# Patient Record
Sex: Female | Born: 1935 | Race: White | Hispanic: No | Smoking: Former smoker
Health system: Southern US, Community
[De-identification: ages and names within clinical notes are randomized; demographics above are authoritative.]

## PROBLEM LIST (undated history)

## (undated) ENCOUNTER — Emergency Department (HOSPITAL_BASED_OUTPATIENT_CLINIC_OR_DEPARTMENT_OTHER): Discharge status: Absent without leave | Payer: Medicare Other

## (undated) DIAGNOSIS — I10 Essential (primary) hypertension: Secondary | ICD-10-CM

## (undated) DIAGNOSIS — N289 Disorder of kidney and ureter, unspecified: Secondary | ICD-10-CM

## (undated) DIAGNOSIS — J449 Chronic obstructive pulmonary disease, unspecified: Secondary | ICD-10-CM

## (undated) DIAGNOSIS — W3400XA Accidental discharge from unspecified firearms or gun, initial encounter: Secondary | ICD-10-CM

## (undated) DIAGNOSIS — I2510 Atherosclerotic heart disease of native coronary artery without angina pectoris: Secondary | ICD-10-CM

## (undated) DIAGNOSIS — J189 Pneumonia, unspecified organism: Secondary | ICD-10-CM

## (undated) DIAGNOSIS — E785 Hyperlipidemia, unspecified: Secondary | ICD-10-CM

## (undated) DIAGNOSIS — K56609 Unspecified intestinal obstruction, unspecified as to partial versus complete obstruction: Secondary | ICD-10-CM

## (undated) DIAGNOSIS — I714 Abdominal aortic aneurysm, without rupture, unspecified: Secondary | ICD-10-CM

## (undated) DIAGNOSIS — C801 Malignant (primary) neoplasm, unspecified: Secondary | ICD-10-CM

## (undated) HISTORY — PX: APPENDECTOMY: SHX54

## (undated) HISTORY — PX: TONSILLECTOMY: SUR1361

## (undated) HISTORY — DX: Atherosclerotic heart disease of native coronary artery without angina pectoris: I25.10

## (undated) HISTORY — PX: OTHER SURGICAL HISTORY: SHX169

## (undated) HISTORY — PX: CHOLECYSTECTOMY: SHX55

## (undated) HISTORY — DX: Abdominal aortic aneurysm, without rupture, unspecified: I71.40

## (undated) HISTORY — DX: Chronic obstructive pulmonary disease, unspecified: J44.9

## (undated) HISTORY — PX: NEPHRECTOMY: SHX65

## (undated) HISTORY — PX: RETINAL DETACHMENT SURGERY: SHX105

## (undated) HISTORY — DX: Essential (primary) hypertension: I10

## (undated) HISTORY — DX: Malignant (primary) neoplasm, unspecified: C80.1

## (undated) HISTORY — DX: Abdominal aortic aneurysm, without rupture: I71.4

## (undated) HISTORY — DX: Pneumonia, unspecified organism: J18.9

---

## 2013-07-16 DIAGNOSIS — IMO0002 Reserved for concepts with insufficient information to code with codable children: Secondary | ICD-10-CM | POA: Insufficient documentation

## 2013-07-16 DIAGNOSIS — N289 Disorder of kidney and ureter, unspecified: Secondary | ICD-10-CM | POA: Insufficient documentation

## 2013-07-29 ENCOUNTER — Ambulatory Visit: Payer: Medicare Other | Attending: Family Medicine | Admitting: Physical Therapy

## 2013-07-29 DIAGNOSIS — M545 Low back pain, unspecified: Secondary | ICD-10-CM | POA: Insufficient documentation

## 2013-07-29 DIAGNOSIS — M25569 Pain in unspecified knee: Secondary | ICD-10-CM | POA: Insufficient documentation

## 2013-07-29 DIAGNOSIS — IMO0001 Reserved for inherently not codable concepts without codable children: Secondary | ICD-10-CM | POA: Insufficient documentation

## 2013-07-29 DIAGNOSIS — M25559 Pain in unspecified hip: Secondary | ICD-10-CM | POA: Insufficient documentation

## 2013-08-01 ENCOUNTER — Ambulatory Visit: Payer: Medicare Other | Admitting: Physical Therapy

## 2013-08-06 ENCOUNTER — Ambulatory Visit: Payer: Medicare Other | Attending: Family Medicine | Admitting: Physical Therapy

## 2013-08-06 DIAGNOSIS — IMO0001 Reserved for inherently not codable concepts without codable children: Secondary | ICD-10-CM | POA: Insufficient documentation

## 2013-08-06 DIAGNOSIS — M545 Low back pain, unspecified: Secondary | ICD-10-CM | POA: Insufficient documentation

## 2013-08-06 DIAGNOSIS — M25569 Pain in unspecified knee: Secondary | ICD-10-CM | POA: Insufficient documentation

## 2013-08-06 DIAGNOSIS — M25559 Pain in unspecified hip: Secondary | ICD-10-CM | POA: Insufficient documentation

## 2013-08-08 ENCOUNTER — Ambulatory Visit: Payer: Medicare Other | Admitting: Physical Therapy

## 2013-08-09 DIAGNOSIS — M1990 Unspecified osteoarthritis, unspecified site: Secondary | ICD-10-CM | POA: Insufficient documentation

## 2013-08-13 ENCOUNTER — Ambulatory Visit: Payer: Medicare Other | Admitting: Physical Therapy

## 2013-08-15 ENCOUNTER — Encounter: Payer: Medicare Other | Admitting: Physical Therapy

## 2013-08-16 ENCOUNTER — Ambulatory Visit: Payer: Medicare Other | Admitting: Physical Therapy

## 2013-08-20 ENCOUNTER — Ambulatory Visit: Payer: Medicare Other | Admitting: Physical Therapy

## 2013-08-22 ENCOUNTER — Ambulatory Visit: Payer: Medicare Other | Admitting: Physical Therapy

## 2013-08-23 ENCOUNTER — Ambulatory Visit: Payer: Medicare Other | Admitting: Physical Therapy

## 2013-08-27 ENCOUNTER — Ambulatory Visit: Payer: Medicare Other | Admitting: Physical Therapy

## 2013-08-29 ENCOUNTER — Ambulatory Visit: Payer: Medicare Other | Admitting: Physical Therapy

## 2013-08-30 ENCOUNTER — Ambulatory Visit: Payer: Medicare Other | Admitting: Physical Therapy

## 2013-09-03 ENCOUNTER — Ambulatory Visit: Payer: Medicare Other | Admitting: Physical Therapy

## 2013-09-05 ENCOUNTER — Ambulatory Visit: Payer: Medicare Other | Admitting: Physical Therapy

## 2013-09-06 ENCOUNTER — Ambulatory Visit: Payer: Medicare Other | Attending: Family Medicine | Admitting: Physical Therapy

## 2013-09-06 DIAGNOSIS — M545 Low back pain, unspecified: Secondary | ICD-10-CM | POA: Insufficient documentation

## 2013-09-06 DIAGNOSIS — IMO0001 Reserved for inherently not codable concepts without codable children: Secondary | ICD-10-CM | POA: Insufficient documentation

## 2013-09-06 DIAGNOSIS — M25559 Pain in unspecified hip: Secondary | ICD-10-CM | POA: Insufficient documentation

## 2013-09-06 DIAGNOSIS — M25569 Pain in unspecified knee: Secondary | ICD-10-CM | POA: Insufficient documentation

## 2013-09-12 ENCOUNTER — Ambulatory Visit (INDEPENDENT_AMBULATORY_CARE_PROVIDER_SITE_OTHER): Payer: Medicare Other | Admitting: Internal Medicine

## 2013-09-12 ENCOUNTER — Encounter: Payer: Self-pay | Admitting: Internal Medicine

## 2013-09-12 ENCOUNTER — Ambulatory Visit (INDEPENDENT_AMBULATORY_CARE_PROVIDER_SITE_OTHER)
Admission: RE | Admit: 2013-09-12 | Discharge: 2013-09-12 | Disposition: A | Discharge status: Home / Self Care | Payer: Medicare Other | Source: Ambulatory Visit | Attending: Internal Medicine | Admitting: Internal Medicine

## 2013-09-12 VITALS — BP 134/62 | HR 42 | Temp 97.8°F | Ht 66.5 in | Wt 155.0 lb

## 2013-09-12 DIAGNOSIS — J449 Chronic obstructive pulmonary disease, unspecified: Secondary | ICD-10-CM

## 2013-09-12 DIAGNOSIS — R0602 Shortness of breath: Secondary | ICD-10-CM

## 2013-09-12 NOTE — Progress Notes (Signed)
Quick Note:  Spoke with pt and notified of results per Dr. Wert. Pt verbalized understanding and denied any questions.  ______ 

## 2013-09-12 NOTE — Progress Notes (Signed)
  Subjective:    Patient ID: Gina Bond, female    DOB: [birth year]  MRN: [id number]  HPI  16 [place] and [place] with no resp complaints but noisy breathing seemed better quit but referred 09/12/2013 to pulmonary clinic by Dr Virl Son for copd eval   09/12/2013 [facility] office visit/ Gina Bond cc "I had no problem until they put me on inhalers" stopped everything x 3 weeks prior to OV (albuterol with spacer)  Says can walk dog daily x 15 min with labored breathing only on inclines  , occ at [facility] with rattling but not really prod coughing or tendency to acute exac  Very confused with details of medical care  No obvious day to day or daytime variabilty or assoc  cp or chest tightness, subjective wheeze overt sinus or hb symptoms. No unusual exp hx or h/o childhood pna/ asthma or knowledge of premature birth.  Sleeping ok without nocturnal  or early am exacerbation  of respiratory  c/o's or need for noct saba. Also denies any obvious fluctuation of symptoms with weather or environmental changes or other aggravating or alleviating factors except as outlined above   Current Medications, Allergies, Complete Past Medical History, Past Surgical History, Family History, and Social History were reviewed in [employer] record.          Review of Systems  Constitutional: Negative for fever, chills and unexpected weight change.  HENT: Positive for congestion. Negative for dental problem, ear pain, nosebleeds, postnasal drip, rhinorrhea, sinus pressure, sneezing, sore throat, trouble swallowing and voice change.   Eyes: Negative for visual disturbance.  Respiratory: Positive for cough and shortness of breath. Negative for choking.   Cardiovascular: Negative for chest pain and leg swelling.  Gastrointestinal: Positive for abdominal pain. Negative for vomiting and diarrhea.  Genitourinary: Negative for difficulty urinating.  Musculoskeletal:  Positive for arthralgias.  Skin: Negative for rash.  Neurological: Negative for tremors, syncope and headaches.  Hematological: Does not bruise/bleed easily.       Objective:   Physical Exam  amb wf nad but somewhat congested cough  Wt Readings from Last 3 Encounters:  09/12/13 155 lb (70.308 kg)     HEENT mild turbinate edema. Full dentures  Oropharynx no thrush or excess pnd or cobblestoning.  No JVD or cervical adenopathy. Mild accessory muscle hypertrophy. Trachea midline, nl thryroid. Chest was hyperinflated by percussion with diminished breath sounds and moderate increased exp time without wheeze. Hoover sign positive at mid inspiration. Regular rate and rhythm without murmur gallop or rub or increase P2 or edema.  Abd: no hsm, nl excursion. Ext warm without cyanosis or clubbing.      CXR  09/12/2013 :  COPD without acute abnormality.       Assessment & Plan:

## 2013-09-12 NOTE — Patient Instructions (Addendum)
GERD (REFLUX)  is an extremely common cause of respiratory symptoms, many times with no significant heartburn at all.    It can be treated with medication, but also with lifestyle changes including avoidance of late meals, excessive alcohol, smoking cessation, and avoid fatty foods, chocolate, peppermint, colas, red wine, and acidic juices such as orange juice.  NO MINT OR MENTHOL PRODUCTS SO NO COUGH DROPS  USE SUGARLESS CANDY INSTEAD (jolley ranchers or Stover's)  NO OIL BASED VITAMINS - use powdered substitutes   We need to find substitutes for your beta blockers substitutes if feasible - Betoptic for eyedrop/ bisoprolol for blood pressure are the exceptions  Only use your albuterol as a rescue medication to be used if you can't catch your breath by resting or doing a relaxed purse lip breathing pattern.  - The less you use it, the better it will work when you need it. - Ok to use up to every 4 hours if you must but call for immediate appointment if use goes up over your usual need - Don't leave home without it !!  (think of it like your spare tire for your car)   Please remember to go to the x-ray department downstairs for your tests - we will call you with the results when they are available.     Please schedule a follow up office visit in 4-6 weeks, sooner if needed with all medications in hand and  with pfts on return

## 2013-09-13 DIAGNOSIS — J449 Chronic obstructive pulmonary disease, unspecified: Secondary | ICD-10-CM | POA: Insufficient documentation

## 2013-09-13 NOTE — Assessment & Plan Note (Signed)
She has more copd by exam than her hx suggests and the commend that she wasn't sob until placed on inhalers is very unusual and shows very poor insight into her problem eg I asked why she would be put on inhalers if she wasn't short of breath in the first place and that really confused her.  DDX of  difficult airways managment all start with A and  include Adherence, Ace Inhibitors, Acid Reflux, Active Sinus Disease, Alpha 1 Antitripsin deficiency, Anxiety masquerading as Airways dz,  ABPA,  allergy(esp in young), Aspiration (esp in elderly), Adverse effects of DPI,  Active smokers, plus two Bs  = Bronchiectasis and Beta blocker use..and one C= CHF  Adherence is always the initial "prime suspect" and is a multilayered concern that requires a "trust but verify" approach in every patient - starting with knowing how to use medications, especially inhalers, correctly, keeping up with refills and understanding the fundamental difference between maintenance and prns vs those medications only taken for a very short course and then stopped and not refilled. This will be the biggest challenge given the poor insight she demonstrated today.  Beta blockers a concern > not only  On timolol eyedrops but also another systemic BB she did not know the name of.  i would prefer she be off these prior to return for pft's before and after B2 for adequate GOLD staging.

## 2013-09-17 ENCOUNTER — Other Ambulatory Visit: Payer: Self-pay | Admitting: Nephrology

## 2013-09-17 DIAGNOSIS — R748 Abnormal levels of other serum enzymes: Secondary | ICD-10-CM

## 2013-09-17 DIAGNOSIS — N179 Acute kidney failure, unspecified: Secondary | ICD-10-CM

## 2013-09-19 ENCOUNTER — Ambulatory Visit
Admission: RE | Admit: 2013-09-19 | Discharge: 2013-09-19 | Disposition: A | Discharge status: Home / Self Care | Payer: Medicare Other | Source: Ambulatory Visit | Attending: Nephrology | Admitting: Nephrology

## 2013-09-19 DIAGNOSIS — N179 Acute kidney failure, unspecified: Secondary | ICD-10-CM

## 2013-09-20 ENCOUNTER — Ambulatory Visit (HOSPITAL_COMMUNITY): Payer: Medicare Other | Attending: Nephrology

## 2013-09-20 DIAGNOSIS — N189 Chronic kidney disease, unspecified: Secondary | ICD-10-CM | POA: Insufficient documentation

## 2013-09-20 DIAGNOSIS — J449 Chronic obstructive pulmonary disease, unspecified: Secondary | ICD-10-CM | POA: Insufficient documentation

## 2013-09-20 DIAGNOSIS — N179 Acute kidney failure, unspecified: Secondary | ICD-10-CM

## 2013-09-20 DIAGNOSIS — N183 Chronic kidney disease, stage 3 unspecified: Secondary | ICD-10-CM

## 2013-09-20 DIAGNOSIS — I129 Hypertensive chronic kidney disease with stage 1 through stage 4 chronic kidney disease, or unspecified chronic kidney disease: Secondary | ICD-10-CM | POA: Insufficient documentation

## 2013-09-20 DIAGNOSIS — Q649 Congenital malformation of urinary system, unspecified: Secondary | ICD-10-CM | POA: Insufficient documentation

## 2013-09-20 DIAGNOSIS — I10 Essential (primary) hypertension: Secondary | ICD-10-CM

## 2013-09-20 DIAGNOSIS — E785 Hyperlipidemia, unspecified: Secondary | ICD-10-CM | POA: Insufficient documentation

## 2013-09-20 DIAGNOSIS — J4489 Other specified chronic obstructive pulmonary disease: Secondary | ICD-10-CM | POA: Insufficient documentation

## 2013-09-24 ENCOUNTER — Ambulatory Visit: Payer: Medicare Other | Admitting: Physical Therapy

## 2013-10-09 ENCOUNTER — Other Ambulatory Visit: Payer: Self-pay | Admitting: Internal Medicine

## 2013-10-09 DIAGNOSIS — J449 Chronic obstructive pulmonary disease, unspecified: Secondary | ICD-10-CM

## 2013-10-10 ENCOUNTER — Ambulatory Visit (HOSPITAL_COMMUNITY)
Admission: RE | Admit: 2013-10-10 | Discharge: 2013-10-10 | Disposition: A | Discharge status: Home / Self Care | Payer: Medicare Other | Source: Ambulatory Visit | Attending: Internal Medicine | Admitting: Internal Medicine

## 2013-10-10 ENCOUNTER — Ambulatory Visit (INDEPENDENT_AMBULATORY_CARE_PROVIDER_SITE_OTHER): Payer: Medicare Other | Admitting: Internal Medicine

## 2013-10-10 ENCOUNTER — Encounter: Payer: Self-pay | Admitting: Internal Medicine

## 2013-10-10 VITALS — BP 142/70 | HR 66 | Temp 97.6°F | Ht 66.0 in | Wt 150.0 lb

## 2013-10-10 DIAGNOSIS — J438 Other emphysema: Secondary | ICD-10-CM | POA: Insufficient documentation

## 2013-10-10 DIAGNOSIS — J449 Chronic obstructive pulmonary disease, unspecified: Secondary | ICD-10-CM

## 2013-10-10 DIAGNOSIS — Z87891 Personal history of nicotine dependence: Secondary | ICD-10-CM | POA: Insufficient documentation

## 2013-10-10 LAB — PULMONARY FUNCTION TEST
DL/VA % pred: 54 %
DL/VA: 2.73 ml/min/mmHg/L
DLCO unc % pred: 44 %
DLCO unc: 11.91 ml/min/mmHg
FEF 25-75 Post: 0.78 L/sec
FEF 25-75 Pre: 0.87 L/sec
FEF2575-%Change-Post: -10 %
FEF2575-%Pred-Post: 49 %
FEF2575-%Pred-Pre: 55 %
FEV1-%Change-Post: -2 %
FEV1-%Pred-Post: 68 %
FEV1-%Pred-Pre: 70 %
FEV1-Post: 1.49 L
FEV1-Pre: 1.53 L
FEV1FVC-%Change-Post: -10 %
FEV1FVC-%Pred-Pre: 85 %
FEV6-%Change-Post: 9 %
FEV6-%Pred-Post: 92 %
FEV6-%Pred-Pre: 84 %
FEV6-Post: 2.55 L
FEV6-Pre: 2.33 L
FEV6FVC-%Change-Post: -1 %
FEV6FVC-%Pred-Post: 102 %
FEV6FVC-%Pred-Pre: 104 %
FVC-%Change-Post: 8 %
FVC-%Pred-Post: 90 %
FVC-%Pred-Pre: 83 %
FVC-Post: 2.62 L
FVC-Pre: 2.41 L
Post FEV1/FVC ratio: 57 %
Post FEV6/FVC ratio: 97 %
Pre FEV1/FVC ratio: 64 %
Pre FEV6/FVC Ratio: 99 %
RV % pred: 238 %
RV: 5.9 L
TLC % pred: 157 %
TLC: 8.43 L

## 2013-10-10 MED ORDER — ALBUTEROL SULFATE (5 MG/ML) 0.5% IN NEBU
2.5000 mg | INHALATION_SOLUTION | Freq: Once | RESPIRATORY_TRACT | Status: AC
  Administered 2013-10-10: 2.5 mg via RESPIRATORY_TRACT

## 2013-10-10 NOTE — Progress Notes (Signed)
  Subjective:    Patient ID: Gina Bond, female    DOB: 06-30-1932  MRN: [id number]  Brief patient profile:  80 [place] with no resp complaints but noisy breathing seemed better quit but referred 09/12/2013 to pulmonary clinic by Dr Virl Son for copd eval  History of Present Illness  09/12/2013 1st [facility] office visit/ Gina Bond cc "I had no problem until they put me on inhalers" stopped everything x 3 weeks prior to OV (albuterol with spacer)  Says can walk dog daily x 15 min with labored breathing only on inclines, occ at [facility] with rattling but not really prod coughing or tendency to acute exac Very confused with details of medical care rec GERD diet   We need to find substitutes for your beta blockers substitutes if feasible - Betoptic for eyedrop/ bisoprolol for blood pressure are the exceptions Only use your albuterol    10/10/2013 f/u ov/Gina Bond re: much better off Beta blockers Chief Complaint  Patient presents with  . Follow-up    Pt states that her breathing is much improved since the last visit. No new co's today.     Not limited by breathing at all  No obvious day to day or daytime variabilty or assoc chronic cough or cp or chest tightness, subjective wheeze overt sinus or hb symptoms. No unusual exp hx or h/o childhood pna/ asthma or knowledge of premature birth.  Sleeping ok without nocturnal  or early am exacerbation  of respiratory  c/o's or need for noct saba. Also denies any obvious fluctuation of symptoms with weather or environmental changes or other aggravating or alleviating factors except as outlined above   Current Medications, Allergies, Complete Past Medical History, Past Surgical History, Family History, and Social History were reviewed in [employer] record.  ROS  The following are not active complaints unless bolded sore throat, dysphagia, dental problems, itching, sneezing,  nasal congestion  or excess/ purulent secretions, ear ache,   fever, chills, sweats, unintended wt loss, pleuritic or exertional cp, hemoptysis,  orthopnea pnd or leg swelling, presyncope, palpitations, heartburn, abdominal pain, anorexia, nausea, vomiting, diarrhea  or change in bowel or urinary habits, change in stools or urine, dysuria,hematuria,  rash, arthralgias, visual complaints, headache, numbness weakness or ataxia or problems with walking or coordination,  change in mood/affect or memory.            Objective:   Physical Exam  amb wf nad no longer with congested sounding cough   Wt Readings from Last 3 Encounters:  10/10/13 150 lb (68.04 kg)  09/12/13 155 lb (70.308 kg)          HEENT mild turbinate edema. Full dentures  Oropharynx no thrush or excess pnd or cobblestoning.  No JVD or cervical adenopathy. Mild accessory muscle hypertrophy. Trachea midline, nl thryroid. Chest was min hyperinflated by percussion with slt  diminished breath sounds and mild increased exp time without wheeze. Hoover sign positive at end inspiration. Regular rate and rhythm without murmur gallop or rub or increase P2 or edema.  Abd: no hsm, nl excursion. Ext warm without cyanosis or clubbing.      CXR  09/12/2013 :  COPD without acute abnormality.       Assessment & Plan:

## 2013-10-10 NOTE — Patient Instructions (Signed)
Only use your albuterol (xopenex) as a rescue medication to be used if you can't catch your breath by resting or doing a relaxed purse lip breathing pattern.  - The less you use it, the better it will work when you need it. - Ok to use up to every 4 hours if you must but call for immediate appointment if use goes up over your usual need - Don't leave home without it !!  (think of it like your spare tire for your car)  Pulmonary follow up is as needed

## 2013-10-10 NOTE — Assessment & Plan Note (Addendum)
-   PFT's 10/10/2013   FEV1 1.53 (70%) ratio 64 and no Better p B2,  DLCO 44% and dlco 54%    I had an extended summary discussion with the patient and daughter today lasting 15 to 20 minutes of a 25 minute visit on the following issues:  As I explained to this patient in detail:  although there may be significant copd present, it does not appear to be limiting activity tolerance any more than a set of worn tires limits someone from driving a car  around a parking lot.  A new set of Michelins might look good but would have no perceived impact on the performance of the car and would not be worth the cost.  That is to say:   this pt is relatively Sedentary so I don't recommend aggressive pulmonary rx at this point unless limiting symptoms arise or acute exacerbations become as issue, neither of which are the case now.  I asked the patient to contact this office at any time in the future should either of these problems arise.    Off beta blockers all of her active symptoms have resolved, so no need for anything but saba prn  See instructions for specific recommendations which were reviewed directly with the patient who was given a copy with highlighter outlining the key components. Marland Kitchen

## 2014-03-10 DIAGNOSIS — E782 Mixed hyperlipidemia: Secondary | ICD-10-CM | POA: Insufficient documentation

## 2014-03-10 DIAGNOSIS — E785 Hyperlipidemia, unspecified: Secondary | ICD-10-CM | POA: Insufficient documentation

## 2014-03-10 DIAGNOSIS — M25559 Pain in unspecified hip: Secondary | ICD-10-CM | POA: Insufficient documentation

## 2014-06-12 ENCOUNTER — Encounter (HOSPITAL_BASED_OUTPATIENT_CLINIC_OR_DEPARTMENT_OTHER): Payer: Self-pay | Admitting: Emergency Medicine

## 2014-06-12 ENCOUNTER — Emergency Department (HOSPITAL_BASED_OUTPATIENT_CLINIC_OR_DEPARTMENT_OTHER)
Admission: EM | Admit: 2014-06-12 | Discharge: 2014-06-13 | Disposition: A | Discharge status: Home / Self Care | Payer: Medicare Other | Attending: Emergency Medicine | Admitting: Emergency Medicine

## 2014-06-12 DIAGNOSIS — J4489 Other specified chronic obstructive pulmonary disease: Secondary | ICD-10-CM | POA: Insufficient documentation

## 2014-06-12 DIAGNOSIS — Z8701 Personal history of pneumonia (recurrent): Secondary | ICD-10-CM | POA: Insufficient documentation

## 2014-06-12 DIAGNOSIS — Z23 Encounter for immunization: Secondary | ICD-10-CM | POA: Insufficient documentation

## 2014-06-12 DIAGNOSIS — Z8639 Personal history of other endocrine, nutritional and metabolic disease: Secondary | ICD-10-CM | POA: Insufficient documentation

## 2014-06-12 DIAGNOSIS — M542 Cervicalgia: Secondary | ICD-10-CM

## 2014-06-12 DIAGNOSIS — S0990XA Unspecified injury of head, initial encounter: Secondary | ICD-10-CM | POA: Insufficient documentation

## 2014-06-12 DIAGNOSIS — R296 Repeated falls: Secondary | ICD-10-CM | POA: Insufficient documentation

## 2014-06-12 DIAGNOSIS — Y9289 Other specified places as the place of occurrence of the external cause: Secondary | ICD-10-CM | POA: Insufficient documentation

## 2014-06-12 DIAGNOSIS — J449 Chronic obstructive pulmonary disease, unspecified: Secondary | ICD-10-CM | POA: Insufficient documentation

## 2014-06-12 DIAGNOSIS — I10 Essential (primary) hypertension: Secondary | ICD-10-CM | POA: Insufficient documentation

## 2014-06-12 DIAGNOSIS — S0993XA Unspecified injury of face, initial encounter: Secondary | ICD-10-CM | POA: Insufficient documentation

## 2014-06-12 DIAGNOSIS — Z79899 Other long term (current) drug therapy: Secondary | ICD-10-CM | POA: Insufficient documentation

## 2014-06-12 DIAGNOSIS — Z862 Personal history of diseases of the blood and blood-forming organs and certain disorders involving the immune mechanism: Secondary | ICD-10-CM | POA: Insufficient documentation

## 2014-06-12 DIAGNOSIS — Z87891 Personal history of nicotine dependence: Secondary | ICD-10-CM | POA: Insufficient documentation

## 2014-06-12 DIAGNOSIS — Z7982 Long term (current) use of aspirin: Secondary | ICD-10-CM | POA: Insufficient documentation

## 2014-06-12 DIAGNOSIS — S199XXA Unspecified injury of neck, initial encounter: Secondary | ICD-10-CM

## 2014-06-12 DIAGNOSIS — S2020XA Contusion of thorax, unspecified, initial encounter: Secondary | ICD-10-CM | POA: Insufficient documentation

## 2014-06-12 DIAGNOSIS — W19XXXA Unspecified fall, initial encounter: Secondary | ICD-10-CM

## 2014-06-12 DIAGNOSIS — Z8742 Personal history of other diseases of the female genital tract: Secondary | ICD-10-CM | POA: Insufficient documentation

## 2014-06-12 DIAGNOSIS — Y9389 Activity, other specified: Secondary | ICD-10-CM | POA: Insufficient documentation

## 2014-06-12 HISTORY — DX: Hyperlipidemia, unspecified: E78.5

## 2014-06-12 HISTORY — DX: Disorder of kidney and ureter, unspecified: N28.9

## 2014-06-12 NOTE — ED Notes (Addendum)
Pt tripped and fell into flower pots. She has an abrasion to her right forearm, right lower leg, and a bruise to her upper right arm. Pt c/o pain in her upper, posterior neck. Pt denies LOC.

## 2014-06-12 NOTE — ED Notes (Signed)
C-collar applied

## 2014-06-13 ENCOUNTER — Emergency Department (HOSPITAL_BASED_OUTPATIENT_CLINIC_OR_DEPARTMENT_OTHER): Payer: Medicare Other

## 2014-06-13 MED ORDER — TETANUS-DIPHTH-ACELL PERTUSSIS 5-2.5-18.5 LF-MCG/0.5 IM SUSP
0.5000 mL | Freq: Once | INTRAMUSCULAR | Status: AC
  Administered 2014-06-13: 0.5 mL via INTRAMUSCULAR
  Filled 2014-06-13: qty 0.5

## 2014-06-13 MED ORDER — ACETAMINOPHEN 500 MG PO TABS: 500.0000 mg | ORAL_TABLET | Freq: Four times a day (QID) | ORAL | Status: DC | PRN

## 2014-06-13 NOTE — ED Notes (Signed)
Patient transported to CT 

## 2014-06-13 NOTE — ED Provider Notes (Signed)
CSN: [id number]     Arrival date & time 06/12/14  2253 History   First MD Initiated Contact with Patient 06/13/14 0057     Chief Complaint  Patient presents with  . Fall     (Consider location/radiation/quality/duration/timing/severity/associated sxs/prior Treatment) HPI Comments: Pr presents with R sided h/a, superior c-spine pain, R shoulder R humerus, R midforearm and generalized R leg pain after a mechanical fall into flower pots outside. No LOC. No AMS. Tetanus unknown. Denies dizziness, anterior CP, SOB.   Patient is a 80 y.o. female presenting with fall. The history is provided by the patient and a relative.  Fall This is a new problem. The current episode started 3 to 5 hours ago. The problem occurs rarely. The problem has not changed since onset.Associated symptoms include chest pain and headaches. Pertinent negatives include no abdominal pain and no shortness of breath. Nothing aggravates the symptoms. Nothing relieves the symptoms. She has tried rest for the symptoms. The treatment provided mild relief.    Past Medical History  Diagnosis Date  . Pneumonia, organism unspecified   . COPD (chronic obstructive pulmonary disease)   . Hypertension   . Renal disorder   . Hyperlipidemia    Past Surgical History  Procedure Laterality Date  . Cholecystectomy    . Nerve ablasion    . Appendectomy    . Retinal detachment surgery    . Tonsillectomy     Family History  Problem Relation Age of Onset  . Emphysema Mother     smoked  . Emphysema Sister     smoked  . Heart disease Father   . Lung cancer Brother     smoked a pipe- with mets to bone  . Stroke Mother    History  Substance Use Topics  . Smoking status: Former Smoker -- 2.00 packs/day for 57 years    Types: Cigarettes    Quit date: 02/02/2010  . Smokeless tobacco: Not on file  . Alcohol Use: No   OB History   Grav Para Term Preterm Abortions TAB SAB Ect Mult Living                 Review of Systems    Constitutional: Negative for fever, chills, diaphoresis, activity change, appetite change and fatigue.  HENT: Negative for congestion, facial swelling, rhinorrhea and sore throat.   Eyes: Negative for photophobia and discharge.  Respiratory: Negative for cough, chest tightness and shortness of breath.   Cardiovascular: Positive for chest pain. Negative for palpitations and leg swelling.  Gastrointestinal: Negative for nausea, vomiting, abdominal pain and diarrhea.  Endocrine: Negative for polydipsia and polyuria.  Genitourinary: Negative for dysuria, frequency, difficulty urinating and pelvic pain.  Musculoskeletal: Negative for arthralgias, back pain, neck pain and neck stiffness.  Skin: Negative for color change and wound.  Allergic/Immunologic: Negative for immunocompromised state.  Neurological: Positive for headaches. Negative for facial asymmetry, weakness and numbness.  Hematological: Does not bruise/bleed easily.  Psychiatric/Behavioral: Negative for confusion and agitation.      Allergies  Other  Home Medications   Prior to Admission medications   Medication Sig Start Date End Date Taking? Authorizing Provider  acetaminophen (TYLENOL) 500 MG tablet Take 500 mg by mouth every 6 (six) hours as needed.    Historical Provider, MD  acetaminophen (TYLENOL) 500 MG tablet Take 1 tablet (500 mg total) by mouth every 6 (six) hours as needed. 06/13/14   Neta Ehlers, MD  aspirin 81 MG tablet Take 81 mg by  mouth daily as needed for pain.    Historical Provider, MD  Brimonidine Tartrate (ALPHAGAN P OP) Apply to eye.    Historical Provider, MD  Cholecalciferol (VITAMIN D3) 5000 UNITS TABS Take 1 tablet by mouth daily.    Historical Provider, MD  ezetimibe (ZETIA) 10 MG tablet Take 10 mg by mouth daily.    Historical Provider, MD  famotidine (PEPCID) 20 MG tablet Take 20 mg by mouth at bedtime as needed.     Historical Provider, MD  fenofibrate micronized (LOFIBRA) 134 MG capsule Take  134 mg by mouth daily.     Historical Provider, MD  Ginkgo Biloba 40 MG TABS Take 120 mg by mouth daily.     Historical Provider, MD  hydrALAZINE (APRESOLINE) 25 MG tablet Take 25 mg by mouth 3 (three) times daily.    Historical Provider, MD  isosorbide mononitrate (IMDUR) 120 MG 24 hr tablet Take 1 tablet by mouth daily. 09/19/13   Historical Provider, MD  levalbuterol Penne Lash HFA) 45 MCG/ACT inhaler Inhale 2 puffs into the lungs every 4 (four) hours as needed for wheezing or shortness of breath.    Historical Provider, MD  methylcellulose oral powder Take 2 packets by mouth as needed.    Historical Provider, MD  Multiple Minerals (CALCIUM-MAGNESIUM-ZINC) TABS Take 1 tablet by mouth daily.    Historical Provider, MD  Multiple Vitamins-Minerals (CENTRUM SILVER PO) Take 1 tablet by mouth daily.    Historical Provider, MD  olmesartan-hydrochlorothiazide (BENICAR HCT) 20-12.5 MG per tablet Take 1 tablet by mouth daily.    Historical Provider, MD  verapamil (VERELAN PM) 240 MG 24 hr capsule Take 240 mg by mouth 2 (two) times daily.     Historical Provider, MD   BP 152/78  Pulse 61  Temp(Src) 98.2 F (36.8 C) (Oral)  Resp 18  Ht 5\' 6"  (1.676 m)  Wt 150 lb (68.04 kg)  BMI 24.22 kg/m2  SpO2 96% Physical Exam  Constitutional: She is oriented to person, place, and time. She appears well-developed and well-nourished. No distress.  HENT:  Head: Normocephalic and atraumatic.    Mouth/Throat: No oropharyngeal exudate.  Eyes: Pupils are equal, round, and reactive to light.  Neck: Normal range of motion. Neck supple. Spinous process tenderness present.    Cardiovascular: Normal rate, regular rhythm and normal heart sounds.  Exam reveals no gallop and no friction rub.   No murmur heard. Pulmonary/Chest: Effort normal and breath sounds normal. No respiratory distress. She has no wheezes. She has no rales.  Abdominal: Soft. Bowel sounds are normal. She exhibits no distension and no mass. There is no  tenderness. There is no rebound and no guarding.  Musculoskeletal: Normal range of motion. She exhibits no edema.       Right shoulder: She exhibits tenderness. She exhibits normal range of motion and no bony tenderness.       Right knee: Tenderness found. Medial joint line tenderness noted.       Right upper arm: She exhibits bony tenderness and swelling. She exhibits no deformity.       Right forearm: She exhibits laceration (superficial 6cm).       Arms:      Right lower leg: She exhibits laceration (superficial).       Legs: Generalized R leg pain, reports hx of same, unable to say if pain worse currently  Neurological: She is alert and oriented to person, place, and time. She displays no atrophy and no tremor. No cranial nerve deficit or  sensory deficit. She exhibits normal muscle tone. She displays no seizure activity. Coordination normal. GCS eye subscore is 4. GCS verbal subscore is 5. GCS motor subscore is 6.  Skin: Skin is warm and dry.  Psychiatric: She has a normal mood and affect.    ED Course  Procedures (including critical care time) Labs Review Labs Reviewed - No data to display  Imaging Review Dg Chest 2 View  06/13/2014   CLINICAL DATA:  Right posterior chest wall pain after a fall.  EXAM: CHEST  2 VIEW  COMPARISON:  09/12/2013  FINDINGS: The heart size and mediastinal contours are within normal limits. Both lungs are clear. The visualized skeletal structures are unremarkable.  IMPRESSION: No active cardiopulmonary disease.   Electronically Signed   By: Lucienne Capers M.D.   On: 06/13/2014 02:38   Dg Pelvis 1-2 Views  06/13/2014   CLINICAL DATA:  Right pelvic pain after a fall.  EXAM: PELVIS - 1-2 VIEW  COMPARISON:  None.  FINDINGS: There is no evidence of pelvic fracture or diastasis. No other pelvic bone lesions are seen. Surgical clips in the right pelvis. Vascular calcifications.  IMPRESSION: No acute bony abnormalities.   Electronically Signed   By: Lucienne Capers  M.D.   On: 06/13/2014 02:40   Dg Shoulder Right  06/13/2014   CLINICAL DATA:  Fall.  Right shoulder pain and bruising.  EXAM: RIGHT SHOULDER - 2+ VIEW  COMPARISON:  Right humerus on 06/13/2014  FINDINGS: There is no evidence of fracture or dislocation. Mild degenerative changes seen involving the acromioclavicular joint. No other significant bone abnormality identified. Soft tissues are unremarkable.  IMPRESSION: No acute findings.   Electronically Signed   By: Earle Gell M.D.   On: 06/13/2014 02:39   Dg Forearm Right  06/13/2014   CLINICAL DATA:  Fall, right forearm patent.  EXAM: RIGHT FOREARM - 2 VIEW  COMPARISON:  None.  FINDINGS: There is no evidence of fracture or other focal bone lesions. Partially imaged corticated fragmentation of ulnar styloid and medial humeral condyle likely reflect remote injury. Mild posterior olecranon soft tissue swelling, mild dorsal forearm soft tissue swelling without subcutaneous gas or radiopaque foreign bodies.  IMPRESSION: No acute fracture deformity or dislocation.   Electronically Signed   By: Elon Alas   On: 06/13/2014 02:40   Dg Femur Right  06/13/2014   CLINICAL DATA:  Fall.  Right femur pain.  EXAM: RIGHT FEMUR - 2 VIEW  COMPARISON:  None.  FINDINGS: There is no evidence of femur fracture or other focal bone lesions. Mild degenerative spurring seen involving the patella. Peripheral vascular calcification noted.  IMPRESSION: No acute findings.   Electronically Signed   By: Earle Gell M.D.   On: 06/13/2014 02:41   Dg Tibia/fibula Right  06/13/2014   CLINICAL DATA:  Right tib-fib pain after a fall.  EXAM: RIGHT TIBIA AND FIBULA - 2 VIEW  COMPARISON:  Right knee 06/24/2013  FINDINGS: Old ununited ossicles inferior to the lateral malleolus. Flattening of the talus likely due to chronic degenerative change. Degenerative changes in the knee. No acute fracture or dislocation perianal vascular calcifications in the soft tissues.  IMPRESSION: No acute bony  abnormalities. Degenerative changes in the right ankle and right knee.   Electronically Signed   By: Lucienne Capers M.D.   On: 06/13/2014 02:41   Ct Head Wo Contrast  06/13/2014   CLINICAL DATA:  Trauma. Patient fell and hit the top of the head. Headache and neck  pain. C-collar. No loss of consciousness.  EXAM: CT HEAD WITHOUT CONTRAST  CT CERVICAL SPINE WITHOUT CONTRAST  TECHNIQUE: Multidetector CT imaging of the head and cervical spine was performed following the standard protocol without intravenous contrast. Multiplanar CT image reconstructions of the cervical spine were also generated.  COMPARISON:  None.  FINDINGS: CT HEAD FINDINGS  Diffuse cerebral atrophy. Low-attenuation changes in the deep white matter consistent with small vessel ischemia. No mass effect or midline shift. No abnormal extra-axial fluid collections. Gray-white matter junctions are distinct. Basal cisterns are not effaced. No evidence of acute intracranial hemorrhage. No depressed skull fractures. Visualized paranasal sinuses and mastoid air cells are not opacified.  CT CERVICAL SPINE FINDINGS  Straightening of the usual cervical lordosis which may be due to patient positioning but ligamentous injury or muscle spasm could also have this appearance. No anterior subluxation. Normal alignment of the facet joints. Degenerative changes in the cervical spine with narrowed interspaces and endplate hypertrophic changes at C4-5, C5-6, and C6-7 levels. Degenerative changes throughout the cervical facet joints. Degenerative changes at C1-2. C1-2 articulation appears otherwise intact. No vertebral compression deformities. No prevertebral soft tissue swelling. No focal bone lesion or bone destruction. Bone cortex and trabecular appear intact.  IMPRESSION: No acute intracranial abnormalities. Chronic atrophy and small vessel ischemic changes.  Nonspecific straightening of the usual cervical lordosis. Degenerative changes in the cervical spine. No  displaced fractures identified.   Electronically Signed   By: Lucienne Capers M.D.   On: 06/13/2014 02:25   Ct Cervical Spine Wo Contrast  06/13/2014   CLINICAL DATA:  Trauma. Patient fell and hit the top of the head. Headache and neck pain. C-collar. No loss of consciousness.  EXAM: CT HEAD WITHOUT CONTRAST  CT CERVICAL SPINE WITHOUT CONTRAST  TECHNIQUE: Multidetector CT imaging of the head and cervical spine was performed following the standard protocol without intravenous contrast. Multiplanar CT image reconstructions of the cervical spine were also generated.  COMPARISON:  None.  FINDINGS: CT HEAD FINDINGS  Diffuse cerebral atrophy. Low-attenuation changes in the deep white matter consistent with small vessel ischemia. No mass effect or midline shift. No abnormal extra-axial fluid collections. Gray-white matter junctions are distinct. Basal cisterns are not effaced. No evidence of acute intracranial hemorrhage. No depressed skull fractures. Visualized paranasal sinuses and mastoid air cells are not opacified.  CT CERVICAL SPINE FINDINGS  Straightening of the usual cervical lordosis which may be due to patient positioning but ligamentous injury or muscle spasm could also have this appearance. No anterior subluxation. Normal alignment of the facet joints. Degenerative changes in the cervical spine with narrowed interspaces and endplate hypertrophic changes at C4-5, C5-6, and C6-7 levels. Degenerative changes throughout the cervical facet joints. Degenerative changes at C1-2. C1-2 articulation appears otherwise intact. No vertebral compression deformities. No prevertebral soft tissue swelling. No focal bone lesion or bone destruction. Bone cortex and trabecular appear intact.  IMPRESSION: No acute intracranial abnormalities. Chronic atrophy and small vessel ischemic changes.  Nonspecific straightening of the usual cervical lordosis. Degenerative changes in the cervical spine. No displaced fractures identified.    Electronically Signed   By: Lucienne Capers M.D.   On: 06/13/2014 02:25   Dg Humerus Right  06/13/2014   CLINICAL DATA:  Right humerus pain after a fall.  EXAM: RIGHT HUMERUS - 2+ VIEW  COMPARISON:  None.  FINDINGS: There is no evidence of fracture or other focal bone lesions. Soft tissues are unremarkable.  IMPRESSION: Negative.   Electronically Signed  By: Lucienne Capers M.D.   On: 06/13/2014 02:39     EKG Interpretation None      MDM   Final diagnoses:  Fall from standing, initial encounter  Multiple contusions of trunk, initial encounter  Neck pain  Closed head injury without loss of consciousness, initial encounter    Pt is a 80 y.o. female with Pmhx as above who presents with R sided h/a, superior c-spine pain, R shoulder R humerus, R midforearm and generalized R leg pain after a mechanical fall into flower pots outside. No LOC. No AMS. Tetanus unknown. GCS 15 w/o focal neuro findings. Tdap updated. Imaging negative for acute traumatic injuries.  Lacerations superficial. Pt kept in soft collar for comfort as she was still having some neck pain, but nml ROM.  She ambulated in dept w/o difficulty. Return precautions given for new or worsening symptoms including AMS, worsening pain, focal neuro symptoms.          Neta Ehlers, MD 06/13/14 [phone]
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
## 2014-06-13 NOTE — ED Notes (Signed)
Pt walked around hall without diff

## 2014-06-13 NOTE — ED Notes (Signed)
Patient transported to X-ray 

## 2014-06-20 ENCOUNTER — Telehealth: Payer: Self-pay | Admitting: Internal Medicine

## 2014-06-20 NOTE — Telephone Encounter (Signed)
Fine with me

## 2014-06-20 NOTE — Telephone Encounter (Signed)
Pt returned call

## 2014-06-20 NOTE — Telephone Encounter (Signed)
Spoke with the pt's daughter  She states that Ortho has rec depo medrol inj for her hip pain  Daughter wants to be sure that MW thinks that this is okay before proceeding with this  Please advise, thanks!

## 2014-06-20 NOTE — Telephone Encounter (Signed)
lmtcb

## 2014-06-23 NOTE — Telephone Encounter (Signed)
lmtcb x1 

## 2014-06-23 NOTE — Telephone Encounter (Signed)
Pt daughter calling bk.Gina Bond

## 2014-06-23 NOTE — Telephone Encounter (Signed)
Pt's daughter is calling back.  I concerned that he hasn't heard anything back yet.  Please call back asap.  Thanks!  Satira Anis

## 2014-06-23 NOTE — Telephone Encounter (Signed)
Called, spoke with [name].  Informed her of below per MW.  She verbalized understanding and voiced no further questions or concerns at this time.

## 2014-07-09 ENCOUNTER — Ambulatory Visit: Payer: Medicare Other | Attending: Orthopedic Surgery | Admitting: Physical Therapy

## 2014-07-09 DIAGNOSIS — M25569 Pain in unspecified knee: Secondary | ICD-10-CM | POA: Insufficient documentation

## 2014-07-09 DIAGNOSIS — M25559 Pain in unspecified hip: Secondary | ICD-10-CM | POA: Insufficient documentation

## 2014-07-11 ENCOUNTER — Ambulatory Visit: Payer: Medicare Other | Admitting: Physical Therapy

## 2014-07-14 ENCOUNTER — Ambulatory Visit: Payer: Medicare Other | Admitting: Physical Therapy

## 2014-07-14 DIAGNOSIS — M25559 Pain in unspecified hip: Secondary | ICD-10-CM | POA: Diagnosis not present

## 2014-07-17 ENCOUNTER — Ambulatory Visit: Payer: Medicare Other | Admitting: Physical Therapy

## 2014-07-17 DIAGNOSIS — M25559 Pain in unspecified hip: Secondary | ICD-10-CM | POA: Diagnosis not present

## 2014-07-22 ENCOUNTER — Ambulatory Visit: Payer: Medicare Other | Admitting: Physical Therapy

## 2014-07-22 DIAGNOSIS — M25559 Pain in unspecified hip: Secondary | ICD-10-CM | POA: Diagnosis not present

## 2014-07-25 ENCOUNTER — Ambulatory Visit: Payer: Medicare Other | Admitting: Physical Therapy

## 2014-07-25 DIAGNOSIS — M25559 Pain in unspecified hip: Secondary | ICD-10-CM | POA: Diagnosis not present

## 2014-07-28 ENCOUNTER — Ambulatory Visit: Payer: Medicare Other | Admitting: Physical Therapy

## 2014-07-28 DIAGNOSIS — M25559 Pain in unspecified hip: Secondary | ICD-10-CM | POA: Diagnosis not present

## 2014-07-31 ENCOUNTER — Ambulatory Visit: Payer: Medicare Other | Admitting: Physical Therapy

## 2014-07-31 DIAGNOSIS — M25559 Pain in unspecified hip: Secondary | ICD-10-CM | POA: Diagnosis not present

## 2014-08-04 ENCOUNTER — Ambulatory Visit: Payer: Medicare Other | Admitting: Physical Therapy

## 2014-08-04 DIAGNOSIS — M25559 Pain in unspecified hip: Secondary | ICD-10-CM | POA: Diagnosis not present

## 2014-08-07 ENCOUNTER — Ambulatory Visit: Payer: Medicare Other | Attending: Orthopedic Surgery | Admitting: Physical Therapy

## 2014-08-07 DIAGNOSIS — M25559 Pain in unspecified hip: Secondary | ICD-10-CM | POA: Insufficient documentation

## 2014-08-07 DIAGNOSIS — M25569 Pain in unspecified knee: Secondary | ICD-10-CM | POA: Insufficient documentation

## 2014-08-14 ENCOUNTER — Ambulatory Visit: Payer: Medicare Other | Admitting: Physical Therapy

## 2014-08-14 DIAGNOSIS — M25559 Pain in unspecified hip: Secondary | ICD-10-CM | POA: Diagnosis not present

## 2014-08-18 ENCOUNTER — Ambulatory Visit: Payer: Medicare Other | Admitting: Physical Therapy

## 2014-08-18 DIAGNOSIS — R252 Cramp and spasm: Secondary | ICD-10-CM | POA: Insufficient documentation

## 2014-08-18 DIAGNOSIS — M25559 Pain in unspecified hip: Secondary | ICD-10-CM | POA: Diagnosis not present

## 2014-08-19 ENCOUNTER — Encounter: Payer: Medicare Other | Admitting: Physical Therapy

## 2014-08-20 ENCOUNTER — Ambulatory Visit: Payer: Medicare Other | Admitting: Physical Therapy

## 2014-08-25 ENCOUNTER — Ambulatory Visit: Payer: Medicare Other | Admitting: Physical Therapy

## 2014-08-25 DIAGNOSIS — M25559 Pain in unspecified hip: Secondary | ICD-10-CM | POA: Diagnosis not present

## 2014-08-28 ENCOUNTER — Ambulatory Visit: Payer: Medicare Other | Admitting: Physical Therapy

## 2014-12-09 ENCOUNTER — Inpatient Hospital Stay (HOSPITAL_BASED_OUTPATIENT_CLINIC_OR_DEPARTMENT_OTHER)
Admission: EM | Admit: 2014-12-09 | Discharge: 2014-12-10 | DRG: 313 | Disposition: A | Discharge status: Home / Self Care | Payer: Medicare Other | Attending: Internal Medicine | Admitting: Internal Medicine

## 2014-12-09 ENCOUNTER — Emergency Department (HOSPITAL_BASED_OUTPATIENT_CLINIC_OR_DEPARTMENT_OTHER): Payer: Medicare Other

## 2014-12-09 ENCOUNTER — Encounter (HOSPITAL_BASED_OUTPATIENT_CLINIC_OR_DEPARTMENT_OTHER): Payer: Self-pay | Admitting: *Deleted

## 2014-12-09 DIAGNOSIS — I351 Nonrheumatic aortic (valve) insufficiency: Secondary | ICD-10-CM | POA: Diagnosis present

## 2014-12-09 DIAGNOSIS — Z7982 Long term (current) use of aspirin: Secondary | ICD-10-CM | POA: Diagnosis not present

## 2014-12-09 DIAGNOSIS — I272 Other secondary pulmonary hypertension: Secondary | ICD-10-CM | POA: Diagnosis present

## 2014-12-09 DIAGNOSIS — Z888 Allergy status to other drugs, medicaments and biological substances status: Secondary | ICD-10-CM

## 2014-12-09 DIAGNOSIS — N183 Chronic kidney disease, stage 3 (moderate): Secondary | ICD-10-CM | POA: Diagnosis present

## 2014-12-09 DIAGNOSIS — R0602 Shortness of breath: Secondary | ICD-10-CM | POA: Diagnosis present

## 2014-12-09 DIAGNOSIS — Z881 Allergy status to other antibiotic agents status: Secondary | ICD-10-CM

## 2014-12-09 DIAGNOSIS — Z882 Allergy status to sulfonamides status: Secondary | ICD-10-CM

## 2014-12-09 DIAGNOSIS — Z825 Family history of asthma and other chronic lower respiratory diseases: Secondary | ICD-10-CM | POA: Diagnosis not present

## 2014-12-09 DIAGNOSIS — I2781 Cor pulmonale (chronic): Secondary | ICD-10-CM | POA: Diagnosis present

## 2014-12-09 DIAGNOSIS — R079 Chest pain, unspecified: Principal | ICD-10-CM | POA: Diagnosis present

## 2014-12-09 DIAGNOSIS — E785 Hyperlipidemia, unspecified: Secondary | ICD-10-CM | POA: Diagnosis present

## 2014-12-09 DIAGNOSIS — I200 Unstable angina: Secondary | ICD-10-CM | POA: Diagnosis present

## 2014-12-09 DIAGNOSIS — I129 Hypertensive chronic kidney disease with stage 1 through stage 4 chronic kidney disease, or unspecified chronic kidney disease: Secondary | ICD-10-CM | POA: Diagnosis present

## 2014-12-09 DIAGNOSIS — Z87891 Personal history of nicotine dependence: Secondary | ICD-10-CM

## 2014-12-09 DIAGNOSIS — E876 Hypokalemia: Secondary | ICD-10-CM | POA: Diagnosis present

## 2014-12-09 DIAGNOSIS — Z79899 Other long term (current) drug therapy: Secondary | ICD-10-CM

## 2014-12-09 DIAGNOSIS — Z823 Family history of stroke: Secondary | ICD-10-CM

## 2014-12-09 DIAGNOSIS — Z7951 Long term (current) use of inhaled steroids: Secondary | ICD-10-CM | POA: Diagnosis not present

## 2014-12-09 DIAGNOSIS — J441 Chronic obstructive pulmonary disease with (acute) exacerbation: Secondary | ICD-10-CM | POA: Diagnosis present

## 2014-12-09 DIAGNOSIS — I10 Essential (primary) hypertension: Secondary | ICD-10-CM

## 2014-12-09 LAB — BASIC METABOLIC PANEL
Anion gap: 9 (ref 5–15)
BUN: 32 mg/dL — ABNORMAL HIGH (ref 6–23)
CO2: 29 mmol/L (ref 19–32)
Calcium: 10.4 mg/dL (ref 8.4–10.5)
Chloride: 100 mEq/L (ref 96–112)
Creatinine, Ser: 1.96 mg/dL — ABNORMAL HIGH (ref 0.50–1.10)
GFR calc Af Amer: 27 mL/min — ABNORMAL LOW (ref >90)
GFR calc non Af Amer: 23 mL/min — ABNORMAL LOW (ref >90)
Glucose, Bld: 141 mg/dL — ABNORMAL HIGH (ref 70–99)
Potassium: 3.4 mmol/L — ABNORMAL LOW (ref 3.5–5.1)
Sodium: 138 mmol/L (ref 135–145)

## 2014-12-09 LAB — CBC
HCT: 39.6 % (ref 36.0–46.0)
Hemoglobin: 12.9 g/dL (ref 12.0–15.0)
MCH: 29.8 pg (ref 26.0–34.0)
MCHC: 32.6 g/dL (ref 30.0–36.0)
MCV: 91.5 fL (ref 78.0–100.0)
Platelets: 343 10*3/uL (ref 150–400)
RBC: 4.33 MIL/uL (ref 3.87–5.11)
RDW: 13.1 % (ref 11.5–15.5)
WBC: 6 10*3/uL (ref 4.0–10.5)

## 2014-12-09 LAB — TROPONIN I: Troponin I: <0.03 ng/mL (ref <0.031)

## 2014-12-09 LAB — BRAIN NATRIURETIC PEPTIDE: B Natriuretic Peptide: 47.7 pg/mL (ref 0.0–100.0)

## 2014-12-09 MED ORDER — ASPIRIN 81 MG PO CHEW: 324.0000 mg | CHEWABLE_TABLET | Freq: Once | ORAL | Status: DC

## 2014-12-09 MED ORDER — ASPIRIN 81 MG PO CHEW
324.0000 mg | CHEWABLE_TABLET | Freq: Once | ORAL | Status: AC
  Administered 2014-12-10: 324 mg via ORAL
  Filled 2014-12-09: qty 4

## 2014-12-09 NOTE — ED Notes (Signed)
Pt is here due to some SOB which began today.  Pt also has some mild hoarseness.  Pt states that the sob is increased with activity.  Pt denies CP but states that she has some mild "heaviness in her chest, its restricted".  Pt states that she has had the same thing before and it lasted a day and was related to a medication change.  Pt feels that this is the case today also.  Pt is on verapamil and the manufacturer changed and she has been taking a blue rather than as before a tan verapamil tablet.  No acute distress, speaking in full sentences, family at the bedside.

## 2014-12-09 NOTE — ED Notes (Signed)
Report given to Debra RN on 3W. [facility] was called for transport.  Family and pt were notified of bed assignment

## 2014-12-10 DIAGNOSIS — J441 Chronic obstructive pulmonary disease with (acute) exacerbation: Secondary | ICD-10-CM

## 2014-12-10 DIAGNOSIS — I200 Unstable angina: Secondary | ICD-10-CM

## 2014-12-10 DIAGNOSIS — I10 Essential (primary) hypertension: Secondary | ICD-10-CM

## 2014-12-10 LAB — CREATININE, SERUM
Creatinine, Ser: 1.82 mg/dL — ABNORMAL HIGH (ref 0.50–1.10)
GFR calc Af Amer: 29 mL/min — ABNORMAL LOW (ref >90)
GFR calc non Af Amer: 25 mL/min — ABNORMAL LOW (ref >90)

## 2014-12-10 LAB — CBC
HCT: 40.1 % (ref 36.0–46.0)
Hemoglobin: 13.2 g/dL (ref 12.0–15.0)
MCH: 30.3 pg (ref 26.0–34.0)
MCHC: 32.9 g/dL (ref 30.0–36.0)
MCV: 92.2 fL (ref 78.0–100.0)
Platelets: 282 10*3/uL (ref 150–400)
RBC: 4.35 MIL/uL (ref 3.87–5.11)
RDW: 13.1 % (ref 11.5–15.5)
WBC: 5.1 10*3/uL (ref 4.0–10.5)

## 2014-12-10 LAB — TROPONIN I
Troponin I: <0.03 ng/mL (ref <0.031)
Troponin I: <0.03 ng/mL (ref <0.031)
Troponin I: <0.03 ng/mL (ref <0.031)

## 2014-12-10 MED ORDER — FENOFIBRATE 160 MG PO TABS: 160.0000 mg | ORAL_TABLET | Freq: Every day | ORAL | Status: DC

## 2014-12-10 MED ORDER — VERAPAMIL HCL ER 240 MG PO CP24: 240.0000 mg | ORAL_CAPSULE | Freq: Two times a day (BID) | ORAL | Status: DC

## 2014-12-10 MED ORDER — EZETIMIBE 10 MG PO TABS: 10.0000 mg | ORAL_TABLET | Freq: Every day | ORAL | Status: DC

## 2014-12-10 MED ORDER — LEVALBUTEROL TARTRATE 45 MCG/ACT IN AERO: 2.0000 | INHALATION_SPRAY | RESPIRATORY_TRACT | Status: DC | PRN

## 2014-12-10 MED ORDER — ONDANSETRON HCL 4 MG/2ML IJ SOLN: 4.0000 mg | Freq: Four times a day (QID) | INTRAMUSCULAR | Status: DC | PRN

## 2014-12-10 MED ORDER — CHOLECALCIFEROL 10 MCG (400 UNIT) PO TABS
400.0000 [IU] | ORAL_TABLET | Freq: Every day | ORAL | Status: DC
  Filled 2014-12-10: qty 1

## 2014-12-10 MED ORDER — ACETAMINOPHEN 325 MG PO TABS: 650.0000 mg | ORAL_TABLET | ORAL | Status: DC | PRN

## 2014-12-10 MED ORDER — HEPARIN SODIUM (PORCINE) 5000 UNIT/ML IJ SOLN
5000.0000 [IU] | Freq: Three times a day (TID) | INTRAMUSCULAR | Status: DC
  Administered 2014-12-10 (×2): 5000 [IU] via SUBCUTANEOUS
  Filled 2014-12-10 (×2): qty 1

## 2014-12-10 MED ORDER — FAMOTIDINE 20 MG PO TABS
20.0000 mg | ORAL_TABLET | Freq: Every day | ORAL | Status: DC
  Administered 2014-12-10: 20 mg via ORAL
  Filled 2014-12-10: qty 1

## 2014-12-10 MED ORDER — HYDRALAZINE HCL 25 MG PO TABS
25.0000 mg | ORAL_TABLET | Freq: Three times a day (TID) | ORAL | Status: DC
  Filled 2014-12-10: qty 1

## 2014-12-10 MED ORDER — VERAPAMIL HCL ER 240 MG PO TBCR
240.0000 mg | EXTENDED_RELEASE_TABLET | Freq: Every day | ORAL | Status: DC
  Filled 2014-12-10: qty 1

## 2014-12-10 MED ORDER — HYDRALAZINE HCL 25 MG PO TABS
25.0000 mg | ORAL_TABLET | Freq: Three times a day (TID) | ORAL | Status: DC
Start: 2014-12-10 — End: 2014-12-10
  Administered 2014-12-10: 25 mg via ORAL

## 2014-12-10 MED ORDER — ISOSORBIDE MONONITRATE ER 60 MG PO TB24: 120.0000 mg | ORAL_TABLET | Freq: Every day | ORAL | Status: DC

## 2014-12-10 MED ORDER — BRIMONIDINE TARTRATE 0.2 % OP SOLN
1.0000 [drp] | Freq: Three times a day (TID) | OPHTHALMIC | Status: DC
  Filled 2014-12-10: qty 5

## 2014-12-10 MED ORDER — POTASSIUM CHLORIDE CRYS ER 10 MEQ PO TBCR
30.0000 meq | EXTENDED_RELEASE_TABLET | Freq: Once | ORAL | Status: AC
  Administered 2014-12-10: 30 meq via ORAL
  Filled 2014-12-10 (×2): qty 1

## 2014-12-10 MED ORDER — MORPHINE SULFATE 2 MG/ML IJ SOLN: 2.0000 mg | INTRAMUSCULAR | Status: DC | PRN

## 2014-12-10 MED ORDER — ALBUTEROL SULFATE (2.5 MG/3ML) 0.083% IN NEBU: 3.0000 mL | INHALATION_SOLUTION | RESPIRATORY_TRACT | Status: DC | PRN

## 2014-12-10 MED ORDER — ZOLPIDEM TARTRATE 5 MG PO TABS: 5.0000 mg | ORAL_TABLET | Freq: Every evening | ORAL | Status: DC | PRN

## 2014-12-10 MED ORDER — OLMESARTAN MEDOXOMIL-HCTZ 20-12.5 MG PO TABS: 1.0000 | ORAL_TABLET | Freq: Every day | ORAL | Status: DC

## 2014-12-10 MED ORDER — VITAMIN D3 125 MCG (5000 UT) PO TABS: 1.0000 | ORAL_TABLET | Freq: Every day | ORAL | Status: DC

## 2014-12-10 MED ORDER — BRIMONIDINE TARTRATE 0.15 % OP SOLN
1.0000 [drp] | Freq: Three times a day (TID) | OPHTHALMIC | Status: DC
  Filled 2014-12-10: qty 5

## 2014-12-10 MED ORDER — IRBESARTAN 150 MG PO TABS: 150.0000 mg | ORAL_TABLET | Freq: Every day | ORAL | Status: DC

## 2014-12-10 MED ORDER — HYDROCHLOROTHIAZIDE 12.5 MG PO CAPS: 12.5000 mg | ORAL_CAPSULE | Freq: Every day | ORAL | Status: DC

## 2014-12-10 NOTE — Consult Note (Signed)
CARDIOLOGY CONSULT NOTE  Patient ID: Gina Bond MRN: [id number] DOB/AGE: Jul 19, 1932 81 y.o.  Admit date: 12/09/2014 Referring Physician  Concha Norway, MD Primary Physician:  [name], MD Reason for Consultation  Chest pain and dyspnea  HPI: Gina Bond is a 81 year old Caucasian female with history of prior tobacco use, chronic renal insufficiency, referred to me for evaluation of chest tightness and worsening shortness of breath and dyspnea on exertion.  She was evaluated by Dr. Christinia Gully, pulmonary medicine and was told to have mild to moderate COPD and was given a when necessary appointment.  Yesterday afternoon, patient was walking her dog, she came home and felt markedly dyspneic and also had chest tightness. Her symptoms persisted all evening, she eventually presented to the emergency room, due to persistent chest discomfort and shortness of breath she was admitted to the hospital. I've been consulted to evaluate her. Patient had chest discomfort in the form of tightness all day yesterday and all night. This morning she states that her breathing is back to her baseline, she has not had any recurrence of chest pain. She does complain of back pain in the left upper back. This has been ongoing for the past couple days. No fever, no cough. No PND or orthopnea. She states that she slept well last night.  She attributes her symptoms to change in the manufacture in hydralazine and Verapamil SR.   Past Medical History  Diagnosis Date  . Pneumonia, organism unspecified   . COPD (chronic obstructive pulmonary disease)   . Hypertension   . Renal disorder   . Hyperlipidemia      Past Surgical History  Procedure Laterality Date  . Cholecystectomy    . Nerve ablasion    . Appendectomy    . Retinal detachment surgery    . Tonsillectomy       Family History  Problem Relation Age of Onset  . Emphysema Mother     smoked  . Emphysema Sister     smoked  . Heart  disease Father   . Lung cancer Brother     smoked a pipe- with mets to bone  . Stroke Mother      Social History: History   Social History  . Marital Status: Widowed    Spouse Name: N/A    Number of Children: N/A  . Years of Education: N/A   Occupational History  . Not on file.   Social History Main Topics  . Smoking status: Former Smoker -- 2.00 packs/day for 57 years    Types: Cigarettes    Quit date: 02/02/2010  . Smokeless tobacco: Not on file  . Alcohol Use: No  . Drug Use: No  . Sexual Activity: Not on file   Other Topics Concern  . Not on file   Social History Narrative     Prescriptions prior to admission  Medication Sig Dispense Refill Last Dose  . acetaminophen (TYLENOL) 500 MG tablet Take 500 mg by mouth every 6 (six) hours as needed.   Taking  . acetaminophen (TYLENOL) 500 MG tablet Take 1 tablet (500 mg total) by mouth every 6 (six) hours as needed. 30 tablet 0   . aspirin 81 MG tablet Take 81 mg by mouth daily as needed for pain.   Taking  . Brimonidine Tartrate (ALPHAGAN P OP) Apply to eye.   Taking  . Cholecalciferol (VITAMIN D3) 5000 UNITS TABS Take 1 tablet by mouth daily.   Taking  . ezetimibe (ZETIA) 10 MG  tablet Take 10 mg by mouth daily.   Taking  . famotidine (PEPCID) 20 MG tablet Take 20 mg by mouth at bedtime as needed.    Taking  . fenofibrate micronized (LOFIBRA) 134 MG capsule Take 134 mg by mouth daily.    Taking  . Ginkgo Biloba 40 MG TABS Take 120 mg by mouth daily.    Taking  . hydrALAZINE (APRESOLINE) 25 MG tablet Take 25 mg by mouth 3 (three) times daily.   Taking  . isosorbide mononitrate (IMDUR) 120 MG 24 hr tablet Take 1 tablet by mouth daily.   Taking  . levalbuterol (XOPENEX HFA) 45 MCG/ACT inhaler Inhale 2 puffs into the lungs every 4 (four) hours as needed for wheezing or shortness of breath.   Taking  . methylcellulose oral powder Take 2 packets by mouth as needed.   Taking  . Multiple Minerals (CALCIUM-MAGNESIUM-ZINC) TABS  Take 1 tablet by mouth daily.   Taking  . Multiple Vitamins-Minerals (CENTRUM SILVER PO) Take 1 tablet by mouth daily.   Taking  . olmesartan-hydrochlorothiazide (BENICAR HCT) 20-12.5 MG per tablet Take 1 tablet by mouth daily.   Taking  . verapamil (VERELAN PM) 240 MG 24 hr capsule Take 240 mg by mouth 2 (two) times daily.    Taking    Scheduled Meds: . brimonidine  1 drop Both Eyes TID  . cholecalciferol  400 Units Oral Daily  . ezetimibe  10 mg Oral Daily  . famotidine  20 mg Oral QHS  . fenofibrate  160 mg Oral Daily  . heparin  5,000 Units Subcutaneous 3 times per day  . hydrALAZINE  25 mg Oral TID  . irbesartan  150 mg Oral Daily   And  . hydrochlorothiazide  12.5 mg Oral Daily  . isosorbide mononitrate  120 mg Oral Daily  . verapamil  240 mg Oral Daily   Continuous Infusions:  PRN Meds:.acetaminophen, albuterol, morphine injection, ondansetron (ZOFRAN) IV, zolpidem  ROS: General: no fevers/chills/night sweats Eyes: no blurry vision, diplopia, or amaurosis ENT: no sore throat or hearing loss Resp: no cough, wheezing, or hemoptysis CV: no edema or palpitations GI: no abdominal pain, nausea, vomiting, diarrhea, or constipation GU: no dysuria, frequency, or hematuria Skin: no rash Neuro: no headache, numbness, tingling, or weakness of extremities Musculoskeletal: no joint pain or swelling Heme: no bleeding, DVT, or easy bruising Endo: no polydipsia or polyuria    Physical Exam: Blood pressure 142/70, pulse 72, temperature 98 F (36.7 C), temperature source Oral, resp. rate 18, height 5' 6.5" (1.689 m), weight 70.171 kg (154 lb 11.2 oz), SpO2 93 %.   General appearance: alert, cooperative, appears stated age and no distress Neck: no carotid bruit, no JVD and supple, symmetrical, trachea midline Lungs: Emphysematous chest, occasional scattered rhonchi present. Chest wall: Left upper back has point tenderness. Heart: S1, S2 normal and There is a 2/6 long systolic murmur  in the tricuspid area. No gallop or rub. Abdomen: soft, non-tender; bowel sounds normal; no masses,  no organomegaly Extremities: extremities normal, atraumatic, no cyanosis or edema Pulses: 2+ and symmetric Neurologic: Grossly normal  Labs:   Lab Results  Component Value Date   WBC 5.1 12/10/2014   HGB 13.2 12/10/2014   HCT 40.1 12/10/2014   MCV 92.2 12/10/2014   PLT 282 12/10/2014    Recent Labs Lab 12/09/14 1917 12/10/14 0055  NA 138  --   K 3.4*  --   CL 100  --   CO2 29  --  BUN 32*  --   CREATININE 1.96* 1.82*  CALCIUM 10.4  --   GLUCOSE 141*  --    Lab Results  Component Value Date   TROPONINI <0.03 12/10/2014    Lipid Panel  No results found for: CHOL, TRIG, HDL, CHOLHDL, VLDL, LDLCALC  EKG: normal EKG, normal sinus rhythm, unchanged from previous tracings.  Outpatient studies:  Echo- 09/05/13 1.Left ventricular cavity is normal in size.   Normal global wall motion.   Normal systolic global function.   Calculated EF 69%. 2.Left atrial cavity is mildly dilated. 3.Right atrial cavity is mildly dilated. 4.Right ventricular cavity is borderline enlarged. 5.Mild to moderate aortic regurgitation.   Mild aortic valve leaflet thickening. 6.Mitral valve structurally normal.   Mild to moderate mitral regurgitation.    7.Tricuspid valve structurally normal.   Mild to moderate tricuspid regurgitation.   Mild pulmonary hypertension with approx.  PA syst.  pressure of 46 mm of Hg.  Lexiscan Myoview stress test 09/09/2013: 1. Resting EKG NSR, Poor R wave progression. LAD, LAFB. Low voltage complexes, pulmonary disease pattern. Stress EKG was non diagnostic for ischemia. No ST-T changes of ischemia noted with pharmacologic stress testing. Stress symptoms included dyspnea and headache. Stress terminated due to completion of protocol. 2.  The perfusion study demonstrated normal isotope uptake both at rest and stress. There was no evidence of ischemia or scar. Dynamic gated  images reveal normal wall motion and endocardial thickening. Left ventricular ejection fraction was estimated to be 77%.   Radiology: Dg Chest 2 View  12/09/2014   CLINICAL DATA:  Acute onset of shortness of breath. Initial encounter.  EXAM: CHEST  2 VIEW  COMPARISON:  Chest radiograph performed 06/13/2014  FINDINGS: The lungs are well-aerated. Pulmonary vascularity is at the upper limits of normal. There is no evidence of focal opacification, pleural effusion or pneumothorax.  The heart is normal in size; the mediastinal contour is within normal limits. No acute osseous abnormalities are seen.  IMPRESSION: No acute cardiopulmonary process seen.   Electronically Signed   By: Garald Balding M.D.   On: 12/09/2014 19:32    ASSESSMENT AND PLAN:  1. Chest tightness ongoing for several hours that started yesterday after exposure to cold weather while she was walking the dog associated with worsening dyspnea on exertion. 2. Shortness of breath   Pulmonary hypertension, secondary   Aortic regurgitation mild.  Cor pulmonale, chronic   Chronic renal insufficiency, stage III (moderate)  H/O Hysterectomy: Due to fibroid uterus and complicated by recurrence of fibroids and development of what appears to be hydronephrosis of one kidney with reduced function: 1980s. Ultrasound of the kidneys 09/11/2013 atrophic right kidney(known since 1988-post pelvic surgical complicatoin), slightly echogenic left kidney. No evidence of hydronephrosis.  Recommendation: In spite of chest pain ongoing for several hours, her EKG is completely normal and serum troponins are negative. I suspect bronchospasm due to cold exposure to be radiology for her presentation. She is completely asymptomatic this morning, daughter present at the bedside, I have discussed with them of how to avoid cold exposure especially when it is very cold and windy. I will set her up to see me in the office in 2-3 weeks, at this point would not make any  changes to her medical regimen from cardiac standpoint. Thankfully for the consultation. I have discussed with Dr. Concha Norway.  Laverda Page, MD 12/10/2014, 8:46 AM [facility]. [place] Pager: [phone] Office: [phone] If no answer Cell [phone]

## 2014-12-10 NOTE — Progress Notes (Signed)
Patient requesting Dr. Einar Gip for cardiology consult, as she has seen him in the past.  Gina Bond

## 2014-12-10 NOTE — Discharge Summary (Signed)
Physician Discharge Summary  Gina Bond BMW:[id number] DOB: February 15, 1932 DOA: 12/09/2014  PCP: [name], MD  Admit date: 12/09/2014 Discharge date: 12/10/2014  Discharge Diagnoses:  Principle problem: Chest pain Active problems Copd HTN  Discharge Condition: stable  Filed Weights   12/09/14 1858 12/10/14 0020  Weight: 72.576 kg (160 lb) 70.171 kg (154 lb 11.2 oz)    History of present illness:  81 y.o. female presents with chest pain. Patient states that this afternoon she noticed shortness of breath and also had heaviness in the chest. Patient states that she did not have pain in the chest but rather had pain in her abdomen. She states that her SOB is a little better now. She states that it may be due to the fact that she is not exerting herself at this time. She states that she has not had a heart attack in the past. She does see Dr Einar Gip for similar symptoms in the past. She also has seen the pulmonologist for her breathing and was told that she had COPD> She is a former smoker. Patient has been on inhalers but is not using them now. She states that some her problems stem from her medications   Hospital Course:  Admitted to telemetry. MI ruled out. No further chest pain. Cardiology consulted and felt her episode of chest pain and dyspnea likely bronchospasm and recommended no further workup. Given Rx for inhaler, and encouraged to keep with her while out walking.  Procedures:  none  Consultations:  cardiology  Discharge Exam: Filed Vitals:   12/10/14 0752  BP: 142/70  Pulse: 72  Temp: 98 F (36.7 C)  Resp:     General: Comfortable Cardiovascular: RRR Respiratory: CTA  Discharge Instructions   Discharge Instructions    Diet - low sodium heart healthy    Complete by:  As directed      Increase activity slowly    Complete by:  As directed           Current Discharge Medication List    CONTINUE these medications which have CHANGED   Details   levalbuterol (XOPENEX HFA) 45 MCG/ACT inhaler Inhale 2 puffs into the lungs every 4 (four) hours as needed for wheezing or shortness of breath. Qty: 1 Inhaler, Refills: 0      CONTINUE these medications which have NOT CHANGED   Details  acetaminophen (TYLENOL) 500 MG tablet Take 1 tablet (500 mg total) by mouth every 6 (six) hours as needed. Qty: 30 tablet, Refills: 0    aspirin 81 MG tablet Take 81 mg by mouth daily as needed for pain.    Brimonidine Tartrate (ALPHAGAN P OP) Apply to eye.    Cholecalciferol (VITAMIN D3) 5000 UNITS TABS Take 1 tablet by mouth daily.    ezetimibe (ZETIA) 10 MG tablet Take 10 mg by mouth daily.    famotidine (PEPCID) 20 MG tablet Take 20 mg by mouth at bedtime as needed.     fenofibrate micronized (LOFIBRA) 134 MG capsule Take 134 mg by mouth daily.     Ginkgo Biloba 40 MG TABS Take 120 mg by mouth daily.     hydrALAZINE (APRESOLINE) 25 MG tablet Take 25 mg by mouth 3 (three) times daily.    isosorbide mononitrate (IMDUR) 120 MG 24 hr tablet Take 1 tablet by mouth daily.    methylcellulose oral powder Take 2 packets by mouth as needed.    Multiple Minerals (CALCIUM-MAGNESIUM-ZINC) TABS Take 1 tablet by mouth daily.    Multiple  Vitamins-Minerals (CENTRUM SILVER PO) Take 1 tablet by mouth daily.    olmesartan-hydrochlorothiazide (BENICAR HCT) 20-12.5 MG per tablet Take 1 tablet by mouth daily.    verapamil (VERELAN PM) 240 MG 24 hr capsule Take 240 mg by mouth 2 (two) times daily.        Allergies  Allergen Reactions  . Other     Pt states that steroid inhalers and a lot of other inhalers make her more SOB.  Pt states that changes in medications have caused her to have sob and chest pressure (change in manufacturer of medication)  . Sulfa Antibiotics   . Tobramycin    Follow-up Information    Follow up with [name], MD.   Specialty:  Family Medicine   Why:  as scheduled   Contact information:   [address] [place] [address] [phone]        The results of significant diagnostics from this hospitalization (including imaging, microbiology, ancillary and laboratory) are listed below for reference.    Significant Diagnostic Studies: Dg Chest 2 View  12/09/2014   CLINICAL DATA:  Acute onset of shortness of breath. Initial encounter.  EXAM: CHEST  2 VIEW  COMPARISON:  Chest radiograph performed 06/13/2014  FINDINGS: The lungs are well-aerated. Pulmonary vascularity is at the upper limits of normal. There is no evidence of focal opacification, pleural effusion or pneumothorax.  The heart is normal in size; the mediastinal contour is within normal limits. No acute osseous abnormalities are seen.  IMPRESSION: No acute cardiopulmonary process seen.   Electronically Signed   By: Garald Balding M.D.   On: 12/09/2014 19:32    Microbiology: No results found for this or any previous visit (from the past 240 hour(s)).   Labs: Basic Metabolic Panel:  Recent Labs Lab 12/09/14 1917 12/10/14 0055  NA 138  --   K 3.4*  --   CL 100  --   CO2 29  --   GLUCOSE 141*  --   BUN 32*  --   CREATININE 1.96* 1.82*  CALCIUM 10.4  --    Liver Function Tests: No results for input(s): AST, ALT, ALKPHOS, BILITOT, PROT, ALBUMIN in the last 168 hours. No results for input(s): LIPASE, AMYLASE in the last 168 hours. No results for input(s): AMMONIA in the last 168 hours. CBC:  Recent Labs Lab 12/09/14 1917 12/10/14 0055  WBC 6.0 5.1  HGB 12.9 13.2  HCT 39.6 40.1  MCV 91.5 92.2  PLT 343 282   Cardiac Enzymes:  Recent Labs Lab 12/09/14 1917 12/10/14 0055 12/10/14 0300 12/10/14 0652  TROPONINI <0.03 <0.03 <0.03 <0.03   BNP: BNP (last 3 results) No results for input(s): PROBNP in the last 8760 hours. CBG: No results for input(s): GLUCAP in the last 168 hours.     SignedDelfina Redwood  [facility] 12/10/2014, 10:43 AM

## 2014-12-10 NOTE — H&P (Signed)
[facility] History and Physical  Gina Bond GUY:[id number] DOB: [birth year] DOA: 12/09/2014  Referring physician: Debby Freiberg, MD PCP: Gina Bill, MD   Chief Complaint: Shortness of breath  HPI: Gina Bond is a 81 y.o. female presents with chest pain. Patient states that this afternoon she noticed shortness of breath and also had heaviness in the chest. Patient states that she did not have pain in the chest but rather had pain in her abdomen. She states that her SOB is a little better now. She states that it may be due to the fact that she is not exerting herself at this time. She states that she has not had a heart attack in the past. She does see Dr Gina Bond for similar symptoms in the past. She also has seen the pulmonologist for her breathing and was told that she had COPD> She is a former smoker. Patient has been on inhalers but is not using them now. She states that some her problems stem from her medications   Review of Systems:  Constitutional:  No weight loss, night sweats, Fevers, chills, ++fatigue.  HEENT:  ++headaches, Difficulty swallowing,Tooth/dental problems,Sore throat  Cardio-vascular:  ++chest tightness, Orthopnea, PND, swelling in lower extremities, dizziness, palpitations  GI:  No heartburn, indigestion, ++abdominal pain, no nausea, vomiting, diarrhea  Resp:  No shortness of breath with exertion or at rest.  No coughing up of blood.No wheezing  Skin:  no rash or lesions GU:  no dysuria, change in color of urine, no urgency or frequency.  Musculoskeletal:  No joint pain or swelling. No decreased range of motion.  Psych:  No change in mood or affect. No depression or anxiety   Past Medical History  Diagnosis Date  . Pneumonia, organism unspecified   . COPD (chronic obstructive pulmonary disease)   . Hypertension   . Renal disorder   . Hyperlipidemia    Past Surgical History  Procedure Laterality Date  . Cholecystectomy    . Nerve  ablasion    . Appendectomy    . Retinal detachment surgery    . Tonsillectomy     Social History:  reports that she quit smoking about 4 years ago. Her smoking use included Cigarettes. She has a 114 pack-year smoking history. She does not have any smokeless tobacco history on file. She reports that she does not drink alcohol or use illicit drugs.  Allergies  Allergen Reactions  . Other     Pt states that steroid inhalers and a lot of other inhalers make her more SOB.  Pt states that changes in medications have caused her to have sob and chest pressure (change in manufacturer of medication)  . Sulfa Antibiotics     Family History  Problem Relation Age of Onset  . Emphysema Mother     smoked  . Emphysema Sister     smoked  . Heart disease Father   . Lung cancer Brother     smoked a pipe- with mets to bone  . Stroke Mother      Prior to Admission medications   Medication Sig Start Date End Date Taking? Authorizing Provider  acetaminophen (TYLENOL) 500 MG tablet Take 500 mg by mouth every 6 (six) hours as needed.    Historical Provider, MD  acetaminophen (TYLENOL) 500 MG tablet Take 1 tablet (500 mg total) by mouth every 6 (six) hours as needed. 06/13/14   Gina Patches, MD  aspirin 81 MG tablet Take 81 mg by mouth daily as needed for  pain.    Historical Provider, MD  Brimonidine Tartrate (ALPHAGAN P OP) Apply to eye.    Historical Provider, MD  Cholecalciferol (VITAMIN D3) 5000 UNITS TABS Take 1 tablet by mouth daily.    Historical Provider, MD  ezetimibe (ZETIA) 10 MG tablet Take 10 mg by mouth daily.    Historical Provider, MD  famotidine (PEPCID) 20 MG tablet Take 20 mg by mouth at bedtime as needed.     Historical Provider, MD  fenofibrate micronized (LOFIBRA) 134 MG capsule Take 134 mg by mouth daily.     Historical Provider, MD  Ginkgo Biloba 40 MG TABS Take 120 mg by mouth daily.     Historical Provider, MD  hydrALAZINE (APRESOLINE) 25 MG tablet Take 25 mg by mouth 3 (three)  times daily.    Historical Provider, MD  isosorbide mononitrate (IMDUR) 120 MG 24 hr tablet Take 1 tablet by mouth daily. 09/19/13   Historical Provider, MD  levalbuterol Penne Lash HFA) 45 MCG/ACT inhaler Inhale 2 puffs into the lungs every 4 (four) hours as needed for wheezing or shortness of breath.    Historical Provider, MD  methylcellulose oral powder Take 2 packets by mouth as needed.    Historical Provider, MD  Multiple Minerals (CALCIUM-MAGNESIUM-ZINC) TABS Take 1 tablet by mouth daily.    Historical Provider, MD  Multiple Vitamins-Minerals (CENTRUM SILVER PO) Take 1 tablet by mouth daily.    Historical Provider, MD  olmesartan-hydrochlorothiazide (BENICAR HCT) 20-12.5 MG per tablet Take 1 tablet by mouth daily.    Historical Provider, MD  verapamil (VERELAN PM) 240 MG 24 hr capsule Take 240 mg by mouth 2 (two) times daily.     Historical Provider, MD   Physical Exam: Filed Vitals:   12/09/14 2130 12/09/14 2200 12/09/14 2230 12/09/14 2300  BP: 135/67 151/67 145/64 141/64  Pulse: 69 70 72 69  Temp:      TempSrc:      Resp: 17 16 19 17   Height:      Weight:      SpO2: 90% 92% 91% 92%    Wt Readings from Last 3 Encounters:  12/09/14 72.576 kg (160 lb)  06/12/14 68.04 kg (150 lb)  10/10/13 68.04 kg (150 lb)    General:  Appears calm and comfortable Eyes: PERRL, normal lids, irises & conjunctiva ENT: grossly normal hearing, lips & tongue Neck: no LAD, masses or thyromegaly Cardiovascular: RRR, no m/r/g. No LE edema. Respiratory: CTA bilaterally, no w/r/r. Normal respiratory effort. Abdomen: soft, ntnd Skin: no rash or induration seen on limited exam Musculoskeletal: grossly normal tone BUE/BLE Psychiatric: grossly normal mood and affect, speech fluent and appropriate Neurologic: grossly non-focal.          Labs on Admission:  Basic Metabolic Panel:  Recent Labs Lab 12/09/14 1917  NA 138  K 3.4*  CL 100  CO2 29  GLUCOSE 141*  BUN 32*  CREATININE 1.96*  CALCIUM  10.4   Liver Function Tests: No results for input(s): AST, ALT, ALKPHOS, BILITOT, PROT, ALBUMIN in the last 168 hours. No results for input(s): LIPASE, AMYLASE in the last 168 hours. No results for input(s): AMMONIA in the last 168 hours. CBC:  Recent Labs Lab 12/09/14 1917  WBC 6.0  HGB 12.9  HCT 39.6  MCV 91.5  PLT 343   Cardiac Enzymes:  Recent Labs Lab 12/09/14 1917  TROPONINI <0.03    BNP (last 3 results) No results for input(s): PROBNP in the last 8760 hours. CBG: No results for  input(s): GLUCAP in the last 168 hours.  Radiological Exams on Admission: Dg Chest 2 View  12/09/2014   CLINICAL DATA:  Acute onset of shortness of breath. Initial encounter.  EXAM: CHEST  2 VIEW  COMPARISON:  Chest radiograph performed 06/13/2014  FINDINGS: The lungs are well-aerated. Pulmonary vascularity is at the upper limits of normal. There is no evidence of focal opacification, pleural effusion or pneumothorax.  The heart is normal in size; the mediastinal contour is within normal limits. No acute osseous abnormalities are seen.  IMPRESSION: No acute cardiopulmonary process seen.   Electronically Signed   By: Garald Balding M.D.   On: 12/09/2014 19:32      Assessment/Plan Active Problems:   Unstable angina   Essential hypertension   COPD exacerbation   1. Unstable Angina -will admit for observation -will check serial enzymes -cardiology evaluation  2. Essential Hypertension -will continue with home medications -monitor pressures -she thinks todays symptoms may be related to a different verapamil tablet  3. COPD with exacerbation -she does not use inhalers -right now she is symptoms free -xopenex as needed  4. Chronic renal failure -will monitor labs -no old labs to compare  5. Elevated Glucose -will monitor labs -will check A1C  6. Hypokalemia -will repeat labs in am -replete K as needed    Code Status: Full Code (must indicate code status--if unknown or  must be presumed, indicate so) DVT Prophylaxis:Heparin Family Communication: Daughter (indicate person spoken with, if applicable, with phone number if by telephone) Disposition Plan: HOme (indicate anticipated LOS)  Time spent: 48min  Nashaun Hillmer A [facility] Pager [phone]

## 2015-04-13 DIAGNOSIS — N644 Mastodynia: Secondary | ICD-10-CM | POA: Insufficient documentation

## 2015-04-14 ENCOUNTER — Other Ambulatory Visit: Payer: Self-pay | Admitting: Family Medicine

## 2015-04-15 ENCOUNTER — Other Ambulatory Visit: Payer: Self-pay | Admitting: Family Medicine

## 2015-07-23 ENCOUNTER — Encounter (HOSPITAL_BASED_OUTPATIENT_CLINIC_OR_DEPARTMENT_OTHER): Payer: Self-pay | Admitting: *Deleted

## 2015-07-23 ENCOUNTER — Inpatient Hospital Stay (HOSPITAL_BASED_OUTPATIENT_CLINIC_OR_DEPARTMENT_OTHER)
Admission: EM | Admit: 2015-07-23 | Discharge: 2015-07-29 | DRG: 389 | Disposition: A | Discharge status: Home / Self Care | Payer: Medicare Other | Attending: Internal Medicine | Admitting: Internal Medicine

## 2015-07-23 DIAGNOSIS — Z86010 Personal history of colonic polyps: Secondary | ICD-10-CM

## 2015-07-23 DIAGNOSIS — M62838 Other muscle spasm: Secondary | ICD-10-CM | POA: Diagnosis not present

## 2015-07-23 DIAGNOSIS — Z888 Allergy status to other drugs, medicaments and biological substances status: Secondary | ICD-10-CM

## 2015-07-23 DIAGNOSIS — K56600 Partial intestinal obstruction, unspecified as to cause: Secondary | ICD-10-CM | POA: Diagnosis present

## 2015-07-23 DIAGNOSIS — N183 Chronic kidney disease, stage 3 unspecified: Secondary | ICD-10-CM | POA: Diagnosis present

## 2015-07-23 DIAGNOSIS — N1832 Chronic kidney disease, stage 3b: Secondary | ICD-10-CM | POA: Diagnosis present

## 2015-07-23 DIAGNOSIS — Z79899 Other long term (current) drug therapy: Secondary | ICD-10-CM

## 2015-07-23 DIAGNOSIS — R079 Chest pain, unspecified: Secondary | ICD-10-CM | POA: Diagnosis present

## 2015-07-23 DIAGNOSIS — R0600 Dyspnea, unspecified: Secondary | ICD-10-CM

## 2015-07-23 DIAGNOSIS — K56609 Unspecified intestinal obstruction, unspecified as to partial versus complete obstruction: Secondary | ICD-10-CM | POA: Diagnosis present

## 2015-07-23 DIAGNOSIS — Z87891 Personal history of nicotine dependence: Secondary | ICD-10-CM

## 2015-07-23 DIAGNOSIS — M79606 Pain in leg, unspecified: Secondary | ICD-10-CM

## 2015-07-23 DIAGNOSIS — Z881 Allergy status to other antibiotic agents status: Secondary | ICD-10-CM

## 2015-07-23 DIAGNOSIS — E785 Hyperlipidemia, unspecified: Secondary | ICD-10-CM | POA: Diagnosis present

## 2015-07-23 DIAGNOSIS — I714 Abdominal aortic aneurysm, without rupture: Secondary | ICD-10-CM | POA: Diagnosis present

## 2015-07-23 DIAGNOSIS — I10 Essential (primary) hypertension: Secondary | ICD-10-CM | POA: Diagnosis present

## 2015-07-23 DIAGNOSIS — K529 Noninfective gastroenteritis and colitis, unspecified: Secondary | ICD-10-CM | POA: Diagnosis present

## 2015-07-23 DIAGNOSIS — L299 Pruritus, unspecified: Secondary | ICD-10-CM | POA: Diagnosis not present

## 2015-07-23 DIAGNOSIS — M549 Dorsalgia, unspecified: Secondary | ICD-10-CM | POA: Insufficient documentation

## 2015-07-23 DIAGNOSIS — N179 Acute kidney failure, unspecified: Secondary | ICD-10-CM | POA: Diagnosis present

## 2015-07-23 DIAGNOSIS — K5900 Constipation, unspecified: Secondary | ICD-10-CM | POA: Diagnosis present

## 2015-07-23 DIAGNOSIS — I129 Hypertensive chronic kidney disease with stage 1 through stage 4 chronic kidney disease, or unspecified chronic kidney disease: Secondary | ICD-10-CM | POA: Diagnosis present

## 2015-07-23 DIAGNOSIS — J449 Chronic obstructive pulmonary disease, unspecified: Secondary | ICD-10-CM | POA: Diagnosis present

## 2015-07-23 DIAGNOSIS — Z882 Allergy status to sulfonamides status: Secondary | ICD-10-CM

## 2015-07-23 DIAGNOSIS — N189 Chronic kidney disease, unspecified: Secondary | ICD-10-CM

## 2015-07-23 DIAGNOSIS — Z7982 Long term (current) use of aspirin: Secondary | ICD-10-CM
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
## 2015-07-23 NOTE — ED Notes (Signed)
Pt here with abdominal pain.  Pt describes upper abdominal pain which is dull and radiates to her back.  Pt reports nausea with this and vomiting.  Pt denies any urinary symptoms.  Pt also reports chills.  LBM was today.

## 2015-07-24 ENCOUNTER — Emergency Department (HOSPITAL_BASED_OUTPATIENT_CLINIC_OR_DEPARTMENT_OTHER): Payer: Medicare Other

## 2015-07-24 ENCOUNTER — Encounter (HOSPITAL_COMMUNITY): Payer: Self-pay

## 2015-07-24 DIAGNOSIS — K5900 Constipation, unspecified: Secondary | ICD-10-CM | POA: Diagnosis present

## 2015-07-24 DIAGNOSIS — Z87891 Personal history of nicotine dependence: Secondary | ICD-10-CM | POA: Diagnosis not present

## 2015-07-24 DIAGNOSIS — J449 Chronic obstructive pulmonary disease, unspecified: Secondary | ICD-10-CM | POA: Diagnosis present

## 2015-07-24 DIAGNOSIS — L299 Pruritus, unspecified: Secondary | ICD-10-CM | POA: Diagnosis not present

## 2015-07-24 DIAGNOSIS — K529 Noninfective gastroenteritis and colitis, unspecified: Secondary | ICD-10-CM | POA: Diagnosis present

## 2015-07-24 DIAGNOSIS — M5440 Lumbago with sciatica, unspecified side: Secondary | ICD-10-CM | POA: Diagnosis not present

## 2015-07-24 DIAGNOSIS — N183 Chronic kidney disease, stage 3 unspecified: Secondary | ICD-10-CM | POA: Diagnosis present

## 2015-07-24 DIAGNOSIS — Z86010 Personal history of colonic polyps: Secondary | ICD-10-CM | POA: Diagnosis not present

## 2015-07-24 DIAGNOSIS — K5669 Other intestinal obstruction: Secondary | ICD-10-CM | POA: Diagnosis not present

## 2015-07-24 DIAGNOSIS — R079 Chest pain, unspecified: Secondary | ICD-10-CM | POA: Diagnosis present

## 2015-07-24 DIAGNOSIS — K56600 Partial intestinal obstruction, unspecified as to cause: Secondary | ICD-10-CM | POA: Diagnosis present

## 2015-07-24 DIAGNOSIS — E785 Hyperlipidemia, unspecified: Secondary | ICD-10-CM | POA: Diagnosis present

## 2015-07-24 DIAGNOSIS — Z881 Allergy status to other antibiotic agents status: Secondary | ICD-10-CM | POA: Diagnosis not present

## 2015-07-24 DIAGNOSIS — R0600 Dyspnea, unspecified: Secondary | ICD-10-CM | POA: Diagnosis not present

## 2015-07-24 DIAGNOSIS — Z79899 Other long term (current) drug therapy: Secondary | ICD-10-CM | POA: Diagnosis not present

## 2015-07-24 DIAGNOSIS — Z888 Allergy status to other drugs, medicaments and biological substances status: Secondary | ICD-10-CM | POA: Diagnosis not present

## 2015-07-24 DIAGNOSIS — I10 Essential (primary) hypertension: Secondary | ICD-10-CM | POA: Diagnosis not present

## 2015-07-24 DIAGNOSIS — I714 Abdominal aortic aneurysm, without rupture: Secondary | ICD-10-CM | POA: Diagnosis present

## 2015-07-24 DIAGNOSIS — M62838 Other muscle spasm: Secondary | ICD-10-CM | POA: Diagnosis not present

## 2015-07-24 DIAGNOSIS — I129 Hypertensive chronic kidney disease with stage 1 through stage 4 chronic kidney disease, or unspecified chronic kidney disease: Secondary | ICD-10-CM | POA: Diagnosis present

## 2015-07-24 DIAGNOSIS — N1832 Chronic kidney disease, stage 3b: Secondary | ICD-10-CM | POA: Diagnosis present

## 2015-07-24 DIAGNOSIS — Z7982 Long term (current) use of aspirin: Secondary | ICD-10-CM | POA: Diagnosis not present

## 2015-07-24 DIAGNOSIS — Z882 Allergy status to sulfonamides status: Secondary | ICD-10-CM | POA: Diagnosis not present

## 2015-07-24 DIAGNOSIS — M79606 Pain in leg, unspecified: Secondary | ICD-10-CM | POA: Diagnosis not present

## 2015-07-24 DIAGNOSIS — N179 Acute kidney failure, unspecified: Secondary | ICD-10-CM | POA: Diagnosis present

## 2015-07-24 LAB — CBC WITH DIFFERENTIAL/PLATELET
Basophils Absolute: 0 10*3/uL (ref 0.0–0.1)
Basophils Absolute: 0 10*3/uL (ref 0.0–0.1)
Basophils Relative: 0 % (ref 0–1)
Basophils Relative: 0 % (ref 0–1)
Eosinophils Absolute: 0.1 10*3/uL (ref 0.0–0.7)
Eosinophils Absolute: 0.1 10*3/uL (ref 0.0–0.7)
Eosinophils Relative: 1 % (ref 0–5)
Eosinophils Relative: 2 % (ref 0–5)
HCT: 41.6 % (ref 36.0–46.0)
HCT: 41.9 % (ref 36.0–46.0)
Hemoglobin: 13.8 g/dL (ref 12.0–15.0)
Hemoglobin: 14.1 g/dL (ref 12.0–15.0)
Lymphocytes Relative: 11 % — ABNORMAL LOW (ref 12–46)
Lymphocytes Relative: 23 % (ref 12–46)
Lymphs Abs: 1.2 10*3/uL (ref 0.7–4.0)
Lymphs Abs: 1.4 10*3/uL (ref 0.7–4.0)
MCH: 30.5 pg (ref 26.0–34.0)
MCH: 31.1 pg (ref 26.0–34.0)
MCHC: 33.2 g/dL (ref 30.0–36.0)
MCHC: 33.7 g/dL (ref 30.0–36.0)
MCV: 91.8 fL (ref 78.0–100.0)
MCV: 92.3 fL (ref 78.0–100.0)
Monocytes Absolute: 0.6 10*3/uL (ref 0.1–1.0)
Monocytes Absolute: 0.5 10*3/uL (ref 0.1–1.0)
Monocytes Relative: 5 % (ref 3–12)
Monocytes Relative: 7 % (ref 3–12)
Neutro Abs: 9.1 10*3/uL — ABNORMAL HIGH (ref 1.7–7.7)
Neutro Abs: 4.3 10*3/uL (ref 1.7–7.7)
Neutrophils Relative %: 83 % — ABNORMAL HIGH (ref 43–77)
Neutrophils Relative %: 68 % (ref 43–77)
Platelets: 280 10*3/uL (ref 150–400)
Platelets: 238 10*3/uL (ref 150–400)
RBC: 4.53 MIL/uL (ref 3.87–5.11)
RBC: 4.54 MIL/uL (ref 3.87–5.11)
RDW: 14.2 % (ref 11.5–15.5)
RDW: 14.1 % (ref 11.5–15.5)
WBC: 11 10*3/uL — ABNORMAL HIGH (ref 4.0–10.5)
WBC: 6.3 10*3/uL (ref 4.0–10.5)

## 2015-07-24 LAB — URINALYSIS, ROUTINE W REFLEX MICROSCOPIC
Bilirubin Urine: NEGATIVE
Glucose, UA: NEGATIVE mg/dL
Hgb urine dipstick: NEGATIVE
Ketones, ur: NEGATIVE mg/dL
Nitrite: NEGATIVE
Protein, ur: NEGATIVE mg/dL
Specific Gravity, Urine: 1.042 — ABNORMAL HIGH (ref 1.005–1.030)
Urobilinogen, UA: 1 mg/dL (ref 0.0–1.0)
pH: 6.5 (ref 5.0–8.0)

## 2015-07-24 LAB — COMPREHENSIVE METABOLIC PANEL
ALT: 26 U/L (ref 14–54)
ALT: 26 U/L (ref 14–54)
AST: 30 U/L (ref 15–41)
AST: 31 U/L (ref 15–41)
Albumin: 4.3 g/dL (ref 3.5–5.0)
Albumin: 3.8 g/dL (ref 3.5–5.0)
Alkaline Phosphatase: 27 U/L — ABNORMAL LOW (ref 38–126)
Alkaline Phosphatase: 25 U/L — ABNORMAL LOW (ref 38–126)
Anion gap: 11 (ref 5–15)
Anion gap: 11 (ref 5–15)
BUN: 28 mg/dL — ABNORMAL HIGH (ref 6–20)
BUN: 20 mg/dL (ref 6–20)
CO2: 27 mmol/L (ref 22–32)
CO2: 27 mmol/L (ref 22–32)
Calcium: 10 mg/dL (ref 8.9–10.3)
Calcium: 9.3 mg/dL (ref 8.9–10.3)
Chloride: 101 mmol/L (ref 101–111)
Chloride: 102 mmol/L (ref 101–111)
Creatinine, Ser: 1.57 mg/dL — ABNORMAL HIGH (ref 0.44–1.00)
Creatinine, Ser: 1.48 mg/dL — ABNORMAL HIGH (ref 0.44–1.00)
GFR calc Af Amer: 35 mL/min — ABNORMAL LOW (ref >60)
GFR calc Af Amer: 37 mL/min — ABNORMAL LOW (ref >60)
GFR calc non Af Amer: 30 mL/min — ABNORMAL LOW (ref >60)
GFR calc non Af Amer: 32 mL/min — ABNORMAL LOW (ref >60)
Glucose, Bld: 139 mg/dL — ABNORMAL HIGH (ref 65–99)
Glucose, Bld: 97 mg/dL (ref 65–99)
Potassium: 3.7 mmol/L (ref 3.5–5.1)
Potassium: 3.6 mmol/L (ref 3.5–5.1)
Sodium: 139 mmol/L (ref 135–145)
Sodium: 140 mmol/L (ref 135–145)
Total Bilirubin: 0.6 mg/dL (ref 0.3–1.2)
Total Bilirubin: 0.5 mg/dL (ref 0.3–1.2)
Total Protein: 6.8 g/dL (ref 6.5–8.1)
Total Protein: 6.2 g/dL — ABNORMAL LOW (ref 6.5–8.1)

## 2015-07-24 LAB — URINE MICROSCOPIC-ADD ON

## 2015-07-24 LAB — GLUCOSE, CAPILLARY
Glucose-Capillary: 88 mg/dL (ref 65–99)
Glucose-Capillary: 111 mg/dL — ABNORMAL HIGH (ref 65–99)

## 2015-07-24 LAB — TROPONIN I: Troponin I: <0.03 ng/mL (ref <0.031)

## 2015-07-24 LAB — LIPASE, BLOOD: Lipase: 44 U/L (ref 22–51)

## 2015-07-24 MED ORDER — ACETAMINOPHEN 650 MG RE SUPP: 650.0000 mg | Freq: Four times a day (QID) | RECTAL | Status: DC | PRN

## 2015-07-24 MED ORDER — SODIUM CHLORIDE 0.9 % IV SOLN
INTRAVENOUS | Status: DC
  Administered 2015-07-24: 02:00:00 via INTRAVENOUS

## 2015-07-24 MED ORDER — CIPROFLOXACIN IN D5W 400 MG/200ML IV SOLN
400.0000 mg | Freq: Once | INTRAVENOUS | Status: AC
  Administered 2015-07-24: 400 mg via INTRAVENOUS
  Filled 2015-07-24: qty 200

## 2015-07-24 MED ORDER — ALBUTEROL SULFATE (2.5 MG/3ML) 0.083% IN NEBU: 2.5000 mg | INHALATION_SOLUTION | RESPIRATORY_TRACT | Status: DC | PRN

## 2015-07-24 MED ORDER — METRONIDAZOLE IN NACL 5-0.79 MG/ML-% IV SOLN
500.0000 mg | Freq: Three times a day (TID) | INTRAVENOUS | Status: DC
  Administered 2015-07-24 – 2015-07-27 (×10): 500 mg via INTRAVENOUS
  Filled 2015-07-24 (×12): qty 100

## 2015-07-24 MED ORDER — ONDANSETRON HCL 4 MG PO TABS: 4.0000 mg | ORAL_TABLET | Freq: Four times a day (QID) | ORAL | Status: DC | PRN

## 2015-07-24 MED ORDER — METOPROLOL TARTRATE 1 MG/ML IV SOLN
5.0000 mg | Freq: Four times a day (QID) | INTRAVENOUS | Status: DC
Start: 2015-07-24 — End: 2015-07-25
  Administered 2015-07-24 – 2015-07-25 (×4): 5 mg via INTRAVENOUS
  Filled 2015-07-24 (×5): qty 5

## 2015-07-24 MED ORDER — DEXTROSE-NACL 5-0.9 % IV SOLN
INTRAVENOUS | Status: AC
  Administered 2015-07-24 – 2015-07-25 (×3): via INTRAVENOUS

## 2015-07-24 MED ORDER — CIPROFLOXACIN IN D5W 400 MG/200ML IV SOLN
400.0000 mg | INTRAVENOUS | Status: DC
  Administered 2015-07-25 – 2015-07-27 (×3): 400 mg via INTRAVENOUS
  Filled 2015-07-24 (×3): qty 200

## 2015-07-24 MED ORDER — FAMOTIDINE IN NACL 20-0.9 MG/50ML-% IV SOLN
20.0000 mg | Freq: Two times a day (BID) | INTRAVENOUS | Status: DC
  Administered 2015-07-24 – 2015-07-29 (×11): 20 mg via INTRAVENOUS
  Filled 2015-07-24 (×15): qty 50

## 2015-07-24 MED ORDER — HYDRALAZINE HCL 20 MG/ML IJ SOLN
10.0000 mg | INTRAMUSCULAR | Status: DC | PRN
  Administered 2015-07-25: 10 mg via INTRAVENOUS
  Filled 2015-07-24: qty 1

## 2015-07-24 MED ORDER — MORPHINE SULFATE (PF) 2 MG/ML IV SOLN
1.0000 mg | INTRAVENOUS | Status: DC | PRN
  Administered 2015-07-25 (×2): 1 mg via INTRAVENOUS
  Filled 2015-07-24 (×2): qty 1

## 2015-07-24 MED ORDER — IOHEXOL 300 MG/ML  SOLN
25.0000 mL | Freq: Once | INTRAMUSCULAR | Status: AC | PRN
  Administered 2015-07-24: 25 mL via ORAL

## 2015-07-24 MED ORDER — ACETAMINOPHEN 325 MG PO TABS
650.0000 mg | ORAL_TABLET | Freq: Four times a day (QID) | ORAL | Status: DC | PRN
  Administered 2015-07-25 – 2015-07-29 (×5): 650 mg via ORAL
  Filled 2015-07-24 (×6): qty 2

## 2015-07-24 MED ORDER — METRONIDAZOLE IN NACL 5-0.79 MG/ML-% IV SOLN
500.0000 mg | Freq: Once | INTRAVENOUS | Status: AC
  Administered 2015-07-24: 500 mg via INTRAVENOUS

## 2015-07-24 MED ORDER — ONDANSETRON HCL 4 MG/2ML IJ SOLN
4.0000 mg | Freq: Once | INTRAMUSCULAR | Status: AC
  Administered 2015-07-24: 4 mg via INTRAVENOUS
  Filled 2015-07-24: qty 2

## 2015-07-24 MED ORDER — SODIUM CHLORIDE 0.9 % IV SOLN: INTRAVENOUS | Status: DC

## 2015-07-24 MED ORDER — IOHEXOL 300 MG/ML  SOLN
75.0000 mL | Freq: Once | INTRAMUSCULAR | Status: AC | PRN
  Administered 2015-07-24: 75 mL via INTRAVENOUS

## 2015-07-24 MED ORDER — ONDANSETRON HCL 4 MG/2ML IJ SOLN
4.0000 mg | Freq: Four times a day (QID) | INTRAMUSCULAR | Status: DC | PRN
  Administered 2015-07-27: 4 mg via INTRAVENOUS
  Filled 2015-07-24 (×2): qty 2

## 2015-07-24 MED ORDER — FENTANYL CITRATE (PF) 100 MCG/2ML IJ SOLN
50.0000 ug | Freq: Once | INTRAMUSCULAR | Status: AC
  Administered 2015-07-24: 50 ug via INTRAVENOUS
  Filled 2015-07-24: qty 2

## 2015-07-24 NOTE — H&P (Signed)
[facility] History and Physical  Deoni Cosey OAC:[id number] DOB: [birth year] DOA: 07/23/2015  Referring physician: Dr. Leonides Schanz. Patient was transferred from [facility] [place]. PCP: [name], MD  Specialists: Dr. Einar Gip. Cardiologist.  Chief Complaint: Abdominal pain and nausea vomiting.  HPI: Gina Bond is a 81 y.o. female with history of COPD, hypertension, chronic kidney disease, hyperlipidemia presents to the ER because of abdominal pain and nausea vomiting. Patient's symptoms started last afternoon. Pain is generalized. Patient had multiple episodes of nausea vomiting. Patient did have a bowel movement last evening. In the ER CT abdomen and pelvis shows features concerning for ileitis with partial small bowel obstruction. Since patient had multiple episodes of nausea vomiting NG tube was placed for suctioning. Patient admitted for further management. Patient states that she had fever chills at home for which patient was started on Cipro and Flagyl.   Review of Systems: As presented in the history of presenting illness, rest negative.  Past Medical History  Diagnosis Date  . Pneumonia, organism unspecified   . COPD (chronic obstructive pulmonary disease)   . Hypertension   . Renal disorder   . Hyperlipidemia    Past Surgical History  Procedure Laterality Date  . Cholecystectomy    . Nerve ablasion    . Appendectomy    . Retinal detachment surgery    . Tonsillectomy     Social History:  reports that she quit smoking about 5 years ago. Her smoking use included Cigarettes. She has a 114 pack-year smoking history. She does not have any smokeless tobacco history on file. She reports that she does not drink alcohol or use illicit drugs. Where does patient live home. Can patient participate in ADLs? Yes.  Allergies  Allergen Reactions  . Other     Pt states that steroid inhalers and a lot of other inhalers make her more SOB.  Pt states that changes in  medications have caused her to have sob and chest pressure (change in manufacturer of medication)  . Sulfa Antibiotics   . Tobramycin     Family History:  Family History  Problem Relation Age of Onset  . Emphysema Mother     smoked  . Emphysema Sister     smoked  . Heart disease Father   . Lung cancer Brother     smoked a pipe- with mets to bone  . Stroke Mother       Prior to Admission medications   Medication Sig Start Date End Date Taking? Authorizing Provider  acetaminophen (TYLENOL) 500 MG tablet Take 1 tablet (500 mg total) by mouth every 6 (six) hours as needed. 06/13/14   Ernestina Patches, MD  aspirin 81 MG tablet Take 81 mg by mouth daily as needed for pain.    Historical Provider, MD  Brimonidine Tartrate (ALPHAGAN P OP) Apply to eye.    Historical Provider, MD  Cholecalciferol (VITAMIN D3) 5000 UNITS TABS Take 1 tablet by mouth daily.    Historical Provider, MD  ezetimibe (ZETIA) 10 MG tablet Take 10 mg by mouth daily.    Historical Provider, MD  famotidine (PEPCID) 20 MG tablet Take 20 mg by mouth at bedtime as needed.     Historical Provider, MD  fenofibrate micronized (LOFIBRA) 134 MG capsule Take 134 mg by mouth daily.     Historical Provider, MD  Ginkgo Biloba 40 MG TABS Take 120 mg by mouth daily.     Historical Provider, MD  hydrALAZINE (APRESOLINE) 25 MG tablet Take 25  mg by mouth 3 (three) times daily.    Historical Provider, MD  isosorbide mononitrate (IMDUR) 120 MG 24 hr tablet Take 1 tablet by mouth daily. 09/19/13   Historical Provider, MD  levalbuterol Penne Lash HFA) 45 MCG/ACT inhaler Inhale 2 puffs into the lungs every 4 (four) hours as needed for wheezing or shortness of breath. 12/10/14   Delfina Redwood, MD  methylcellulose oral powder Take 2 packets by mouth as needed.    Historical Provider, MD  Multiple Minerals (CALCIUM-MAGNESIUM-ZINC) TABS Take 1 tablet by mouth daily.    Historical Provider, MD  Multiple Vitamins-Minerals (CENTRUM SILVER PO) Take 1  tablet by mouth daily.    Historical Provider, MD  olmesartan-hydrochlorothiazide (BENICAR HCT) 20-12.5 MG per tablet Take 1 tablet by mouth daily.    Historical Provider, MD  verapamil (VERELAN PM) 240 MG 24 hr capsule Take 240 mg by mouth 2 (two) times daily.     Historical Provider, MD    Physical Exam: Filed Vitals:   07/24/15 0400 07/24/15 0402 07/24/15 0418 07/24/15 0515  BP:  126/58  129/63  Pulse: 73 73 71 72  Temp:  97.8 F (36.6 C)  97.9 F (36.6 C)  TempSrc:  Oral  Oral  Resp:    17  Height:    5' 6.5" (1.689 m)  Weight:    71.7 kg (158 lb 1.1 oz)  SpO2: 88% 94% 90% 91%     General:  Moderately built and nourished.  Eyes: Anicteric no pallor.  ENT: No discharge from the ears eyes nose and mouth.  Neck: No mass felt.  Cardiovascular: S1 and S2 heard.  Respiratory: No rhonchi or crepitations.  Abdomen: Soft mildly distended. Bowel sounds febrile. No guarding or rigidity.  Skin: No rash.  Musculoskeletal: No edema.  Psychiatric: Appears normal.  Neurologic: Alert oriented to time place and person. Moves all extremities.  Labs on Admission:  Basic Metabolic Panel:  Recent Labs Lab 07/24/15 0040  NA 139  K 3.7  CL 101  CO2 27  GLUCOSE 139*  BUN 28*  CREATININE 1.57*  CALCIUM 10.0   Liver Function Tests:  Recent Labs Lab 07/24/15 0040  AST 30  ALT 26  ALKPHOS 27*  BILITOT 0.6  PROT 6.8  ALBUMIN 4.3    Recent Labs Lab 07/24/15 0040  LIPASE 44   No results for input(s): AMMONIA in the last 168 hours. CBC:  Recent Labs Lab 07/24/15 0040  WBC 11.0*  NEUTROABS 9.1*  HGB 13.8  HCT 41.6  MCV 91.8  PLT 280   Cardiac Enzymes:  Recent Labs Lab 07/24/15 0040  TROPONINI <0.03    BNP (last 3 results)  Recent Labs  12/09/14 1917  BNP 47.7    ProBNP (last 3 results) No results for input(s): PROBNP in the last 8760 hours.  CBG: No results for input(s): GLUCAP in the last 168 hours.  Radiological Exams on  Admission: Dg Chest 2 View  07/24/2015   CLINICAL DATA:  Acute onset of generalized chest pain. Initial encounter.  EXAM: CHEST  2 VIEW  COMPARISON:  Chest radiograph performed 12/09/2014  FINDINGS: The lungs are well-aerated and clear. There is no evidence of focal opacification, pleural effusion or pneumothorax.  The heart is normal in size; the mediastinal contour is within normal limits. No acute osseous abnormalities are seen.  IMPRESSION: No acute cardiopulmonary process seen.   Electronically Signed   By: Garald Balding M.D.   On: 07/24/2015 02:57   Ct Abdomen Pelvis  W Contrast  07/24/2015   CLINICAL DATA:  Diffuse abdominal pain  EXAM: CT ABDOMEN AND PELVIS WITH CONTRAST  TECHNIQUE: Multidetector CT imaging of the abdomen and pelvis was performed using the standard protocol following bolus administration of intravenous contrast.  CONTRAST:  44mL OMNIPAQUE IOHEXOL 300 MG/ML SOLN, 71mL OMNIPAQUE IOHEXOL 300 MG/ML SOLN  COMPARISON:  None currently available  FINDINGS: BODY WALL: Scattered ventral abdominal wall scarring.  LOWER CHEST: Coronary atherosclerosis.  No acute finding.  ABDOMEN/PELVIS:  Liver: No focal abnormality.  Biliary: Cholecystectomy.  No pathologic duct distension.  Pancreas: Unremarkable.  Spleen: Unremarkable.  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  Kidneys and ureters: Small and poorly functional right kidney, likely from chronic renal artery stenosis.  Bladder: Unremarkable.  Reproductive: Hysterectomy.  No adnexal mass.  Bowel: Bowel loops in the right lower quadrant shows thickening from submucosal edema with mesenteric edema. Above the thickened loops, there is fecalized contents and fluid filling with mild distension. This type of enteritis is usually infectious, but can be seen with microvascular ischemia or autoimmune disease.  Appendectomy.  Mild colonic diverticulosis.  Retroperitoneum: Surgical clips in the  right retroperitoneum and pelvis from uncertain procedure. There is no indication of malignancy history or radiation in electronic medical record.  Peritoneum: No ascites or pneumoperitoneum.  Vascular: Upper abdominal aortic aneurysm measuring 39 mm outer wall diameter, increased by 2 mm since 2014. Stable pattern of peripheral calcified and noncalcified atherosclerosis and probable left mural thrombus.  OSSEOUS: No acute abnormalities.  IMPRESSION: 1. Ileitis causing partial small bowel obstruction. 2. 39 mm suprarenal aortic aneurysm, increased by 2 mm since 2014. 3. Chronic findings are stable from 2014 and noted above.   Electronically Signed   By: Monte Fantasia M.D.   On: 07/24/2015 02:37     Assessment/Plan Principal Problem:   Partial small bowel obstruction Active Problems:   COPD GOLD II    Essential hypertension   Ileitis   CKD (chronic kidney disease) stage 3, GFR 30-59 ml/min   1. Partial small bowel obstruction possible ileitis - I have discussed with on-call surgeon Dr. Georgiana Spinner who will be seeing patient in consult. I have kept patient nothing by mouth and continue with NG tube suction. Gentle hydration. Pain relief medications. Cipro and Flagyl. 2. Hypertension - since patient is nothing by mouth I have placed patient on scheduled dose of metoprolol since patient takes large doses of verapamil. When necessary IV hydralazine. 3. COPD - presently not wheezing. Continue home inhalers. 4. Chronic kidney disease stage III - creatinine appears to be at baseline. Closely follow metabolic panel.  I have personally reviewed the patient's chest x-ray. I have discussed with on-call surgeon. I have reviewed patient's old charts and labs.   DVT Prophylaxis SCDs.  Code Status: Full code.  Family Communication: Discussed with patient.  Disposition Plan: Admit to inpatient.    Trennon Torbeck N. [facility] Pager [phone].  If 7PM-7AM, please contact  night-coverage [email or web address] Password [email or web address] 07/24/2015, 6:11 AM

## 2015-07-24 NOTE — Progress Notes (Addendum)
Pt came in 6N08 via [facility] from [place]; pt A/o x 4. Paged admitting MD.
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
## 2015-07-24 NOTE — Progress Notes (Signed)
ANTIBIOTIC CONSULT NOTE - INITIAL  Pharmacy Consult for Cipro  Indication: Intra-abdominal infection  Allergies  Allergen Reactions  . Other     Pt states that steroid inhalers and a lot of other inhalers make her more SOB.  Pt states that changes in medications have caused her to have sob and chest pressure (change in manufacturer of medication)  . Sulfa Antibiotics   . Tobramycin     Patient Measurements: Height: 5' 6.5" (168.9 cm) Weight: 158 lb 1.1 oz (71.7 kg) IBW/kg (Calculated) : 60.45  Vital Signs: Temp: 97.9 F (36.6 C) (08/19 0515) Temp Source: Oral (08/19 0515) BP: 129/63 mmHg (08/19 0515) Pulse Rate: 72 (08/19 0515)  Labs:  Recent Labs  07/24/15 0040  WBC 11.0*  HGB 13.8  PLT 280  CREATININE 1.57*   Estimated Creatinine Clearance: 27.3 mL/min (by C-G formula based on Cr of 1.57).  Medical History: Past Medical History  Diagnosis Date  . Pneumonia, organism unspecified   . COPD (chronic obstructive pulmonary disease)   . Hypertension   . Renal disorder   . Hyperlipidemia     Assessment: 81 y/o F to start Cipro for intra-abdominal infection, WBC 11, Scr elevated at 1.57, other labs reviewed.   Plan:  -Cipro 400 mg IV q24h (may change to q12h if Scr improves) -Flagyl per MD -F/U infectious work-up  Narda Bonds 07/24/2015,6:20 AM

## 2015-07-24 NOTE — Progress Notes (Signed)
Patient's pain improved.  No N/V.  NG has only put out 185mL all day.  Would recommend discontinuing NG and starting clears.  She had a BM earlier today and she's passing flatus.  Can likely advance diet as tolerated.  Jomarie Longs, PA-C General Surgery [facility] [phone]
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
## 2015-07-25 ENCOUNTER — Inpatient Hospital Stay (HOSPITAL_COMMUNITY): Payer: Medicare Other

## 2015-07-25 DIAGNOSIS — K56609 Unspecified intestinal obstruction, unspecified as to partial versus complete obstruction: Secondary | ICD-10-CM | POA: Diagnosis present

## 2015-07-25 LAB — URINE CULTURE: Culture: NO GROWTH

## 2015-07-25 LAB — GLUCOSE, CAPILLARY
Glucose-Capillary: 178 mg/dL — ABNORMAL HIGH (ref 65–99)
Glucose-Capillary: 83 mg/dL (ref 65–99)
Glucose-Capillary: 94 mg/dL (ref 65–99)
Glucose-Capillary: 98 mg/dL (ref 65–99)
Glucose-Capillary: 99 mg/dL (ref 65–99)

## 2015-07-25 MED ORDER — SODIUM CHLORIDE 0.9 % IV SOLN
INTRAVENOUS | Status: DC
  Administered 2015-07-25: 19:00:00 via INTRAVENOUS
  Filled 2015-07-25 (×2): qty 1000

## 2015-07-25 MED ORDER — METOPROLOL TARTRATE 1 MG/ML IV SOLN
2.5000 mg | Freq: Four times a day (QID) | INTRAVENOUS | Status: DC
  Administered 2015-07-25 – 2015-07-27 (×8): 2.5 mg via INTRAVENOUS
  Filled 2015-07-25 (×8): qty 5

## 2015-07-25 MED ORDER — BISACODYL 10 MG RE SUPP
10.0000 mg | Freq: Once | RECTAL | Status: AC
  Administered 2015-07-25: 10 mg via RECTAL
  Filled 2015-07-25: qty 1
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
## 2015-07-25 NOTE — Progress Notes (Signed)
Subjective: Passing flatus, but no more bowel movements Tolerating clears without nausea Feels distended Ambulating  Objective: Vital signs in last 24 hours: Temp:  [97.7 F (36.5 C)-98.4 F (36.9 C)] 97.7 F (36.5 C) (08/20 0512) Pulse Rate:  [55-77] 55 (08/20 0512) Resp:  [16-18] 16 (08/20 0512) BP: (123-141)/(63-65) 134/63 mmHg (08/20 0512) SpO2:  [88 %-98 %] 98 % (08/20 0512) Last BM Date: 07/24/15  Intake/Output from previous day: 08/19 0701 - 08/20 0700 In: 3249 [P.O.:720; I.V.:1929; IV Piggyback:600] Out: 1000 [Urine:850; Emesis/NG output:150] Intake/Output this shift:    General appearance: alert, cooperative and no distress GI: distended; hypoactive bowel sounds; non-tender  Lab Results:   Recent Labs  07/24/15 0040 07/24/15 0720  WBC 11.0* 6.3  HGB 13.8 14.1  HCT 41.6 41.9  PLT 280 238   BMET  Recent Labs  07/24/15 0040 07/24/15 0720  NA 139 140  K 3.7 3.6  CL 101 102  CO2 27 27  GLUCOSE 139* 97  BUN 28* 20  CREATININE 1.57* 1.48*  CALCIUM 10.0 9.3   PT/INR No results for input(s): LABPROT, INR in the last 72 hours. ABG No results for input(s): PHART, HCO3 in the last 72 hours.  Invalid input(s): PCO2, PO2  Studies/Results: Dg Chest 2 View  07/24/2015   CLINICAL DATA:  Acute onset of generalized chest pain. Initial encounter.  EXAM: CHEST  2 VIEW  COMPARISON:  Chest radiograph performed 12/09/2014  FINDINGS: The lungs are well-aerated and clear. There is no evidence of focal opacification, pleural effusion or pneumothorax.  The heart is normal in size; the mediastinal contour is within normal limits. No acute osseous abnormalities are seen.  IMPRESSION: No acute cardiopulmonary process seen.   Electronically Signed   By: Garald Balding M.D.   On: 07/24/2015 02:57   Ct Abdomen Pelvis W Contrast  07/24/2015   CLINICAL DATA:  Diffuse abdominal pain  EXAM: CT ABDOMEN AND PELVIS WITH CONTRAST  TECHNIQUE: Multidetector CT imaging of the  abdomen and pelvis was performed using the standard protocol following bolus administration of intravenous contrast.  CONTRAST:  82mL OMNIPAQUE IOHEXOL 300 MG/ML SOLN, 5mL OMNIPAQUE IOHEXOL 300 MG/ML SOLN  COMPARISON:  None currently available  FINDINGS: BODY WALL: Scattered ventral abdominal wall scarring.  LOWER CHEST: Coronary atherosclerosis.  No acute finding.  ABDOMEN/PELVIS:  Liver: No focal abnormality.  Biliary: Cholecystectomy.  No pathologic duct distension.  Pancreas: Unremarkable.  Spleen: Unremarkable.  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  Kidneys and ureters: Small and poorly functional right kidney, likely from chronic renal artery stenosis.  Bladder: Unremarkable.  Reproductive: Hysterectomy.  No adnexal mass.  Bowel: Bowel loops in the right lower quadrant shows thickening from submucosal edema with mesenteric edema. Above the thickened loops, there is fecalized contents and fluid filling with mild distension. This type of enteritis is usually infectious, but can be seen with microvascular ischemia or autoimmune disease.  Appendectomy.  Mild colonic diverticulosis.  Retroperitoneum: Surgical clips in the right retroperitoneum and pelvis from uncertain procedure. There is no indication of malignancy history or radiation in electronic medical record.  Peritoneum: No ascites or pneumoperitoneum.  Vascular: Upper abdominal aortic aneurysm measuring 39 mm outer wall diameter, increased by 2 mm since 2014. Stable pattern of peripheral calcified and noncalcified atherosclerosis and probable left mural thrombus.  OSSEOUS: No acute abnormalities.  IMPRESSION: 1. Ileitis causing partial small bowel obstruction. 2. 39 mm suprarenal aortic aneurysm, increased by 2 mm  since 2014. 3. Chronic findings are stable from 2014 and noted above.   Electronically Signed   By: Monte Fantasia M.D.   On: 07/24/2015 02:37     Anti-infectives: Anti-infectives    Start     Dose/Rate Route Frequency Ordered Stop   07/25/15 0300  ciprofloxacin (CIPRO) IVPB 400 mg     400 mg 200 mL/hr over 60 Minutes Intravenous Every 24 hours 07/24/15 0620     07/24/15 1200  metroNIDAZOLE (FLAGYL) IVPB 500 mg     500 mg 100 mL/hr over 60 Minutes Intravenous Every 8 hours 07/24/15 0615     07/24/15 0245  ciprofloxacin (CIPRO) IVPB 400 mg     400 mg 200 mL/hr over 60 Minutes Intravenous  Once 07/24/15 0243 07/24/15 0437   07/24/15 0245  metroNIDAZOLE (FLAGYL) IVPB 500 mg     500 mg 100 mL/hr over 60 Minutes Intravenous  Once 07/24/15 0243 07/24/15 0509      Assessment/Plan: s/p * No surgery found * Partial SBO - still with some abdominal distention  Would not advance past clear liquids Dulcolax suppository Abd films today.   LOS: 1 day    Simon Aaberg K. 07/25/2015

## 2015-07-26 ENCOUNTER — Inpatient Hospital Stay (HOSPITAL_COMMUNITY): Payer: Medicare Other

## 2015-07-26 DIAGNOSIS — R0600 Dyspnea, unspecified: Secondary | ICD-10-CM

## 2015-07-26 LAB — BASIC METABOLIC PANEL
Anion gap: 7 (ref 5–15)
BUN: 9 mg/dL (ref 6–20)
CO2: 28 mmol/L (ref 22–32)
Calcium: 8.7 mg/dL — ABNORMAL LOW (ref 8.9–10.3)
Chloride: 106 mmol/L (ref 101–111)
Creatinine, Ser: 1.17 mg/dL — ABNORMAL HIGH (ref 0.44–1.00)
GFR calc Af Amer: 50 mL/min — ABNORMAL LOW (ref >60)
GFR calc non Af Amer: 43 mL/min — ABNORMAL LOW (ref >60)
Glucose, Bld: 86 mg/dL (ref 65–99)
Potassium: 3.7 mmol/L (ref 3.5–5.1)
Sodium: 141 mmol/L (ref 135–145)

## 2015-07-26 LAB — GLUCOSE, CAPILLARY
Glucose-Capillary: 87 mg/dL (ref 65–99)
Glucose-Capillary: 90 mg/dL (ref 65–99)

## 2015-07-26 LAB — TROPONIN I
Troponin I: <0.03 ng/mL (ref <0.031)
Troponin I: <0.03 ng/mL (ref <0.031)

## 2015-07-26 LAB — MAGNESIUM: Magnesium: 2 mg/dL (ref 1.7–2.4)

## 2015-07-26 MED ORDER — HYDRALAZINE HCL 25 MG PO TABS
25.0000 mg | ORAL_TABLET | Freq: Three times a day (TID) | ORAL | Status: DC
Start: 2015-07-26 — End: 2015-07-29
  Administered 2015-07-26 – 2015-07-29 (×8): 25 mg via ORAL
  Filled 2015-07-26 (×3): qty 1

## 2015-07-26 MED ORDER — MORPHINE SULFATE (PF) 4 MG/ML IV SOLN
4.0000 mg | Freq: Once | INTRAVENOUS | Status: AC
  Administered 2015-07-26: 4 mg via INTRAVENOUS
  Filled 2015-07-26: qty 1

## 2015-07-26 MED ORDER — ISOSORBIDE MONONITRATE ER 60 MG PO TB24
120.0000 mg | ORAL_TABLET | Freq: Every day | ORAL | Status: DC
  Administered 2015-07-27 – 2015-07-29 (×3): 120 mg via ORAL
  Filled 2015-07-26 (×3): qty 2

## 2015-07-26 MED ORDER — HYDRALAZINE HCL 20 MG/ML IJ SOLN: 5.0000 mg | Freq: Four times a day (QID) | INTRAMUSCULAR | Status: DC | PRN

## 2015-07-26 MED ORDER — HYDRALAZINE HCL 25 MG PO TABS
25.0000 mg | ORAL_TABLET | Freq: Three times a day (TID) | ORAL | Status: DC
  Filled 2015-07-26: qty 1

## 2015-07-26 MED ORDER — MORPHINE SULFATE (PF) 2 MG/ML IV SOLN
2.0000 mg | INTRAVENOUS | Status: DC | PRN
  Administered 2015-07-26 – 2015-07-27 (×6): 2 mg via INTRAVENOUS
  Filled 2015-07-26 (×6): qty 1
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
## 2015-07-27 ENCOUNTER — Inpatient Hospital Stay (HOSPITAL_COMMUNITY): Payer: Medicare Other

## 2015-07-27 DIAGNOSIS — M549 Dorsalgia, unspecified: Secondary | ICD-10-CM | POA: Insufficient documentation

## 2015-07-27 DIAGNOSIS — M5440 Lumbago with sciatica, unspecified side: Secondary | ICD-10-CM

## 2015-07-27 LAB — TROPONIN I: Troponin I: <0.03 ng/mL (ref <0.031)

## 2015-07-27 LAB — CBC
HCT: 41.1 % (ref 36.0–46.0)
Hemoglobin: 13.7 g/dL (ref 12.0–15.0)
MCH: 30.4 pg (ref 26.0–34.0)
MCHC: 33.3 g/dL (ref 30.0–36.0)
MCV: 91.3 fL (ref 78.0–100.0)
Platelets: 227 10*3/uL (ref 150–400)
RBC: 4.5 MIL/uL (ref 3.87–5.11)
RDW: 13.8 % (ref 11.5–15.5)
WBC: 5.5 10*3/uL (ref 4.0–10.5)

## 2015-07-27 LAB — BASIC METABOLIC PANEL
Anion gap: 9 (ref 5–15)
BUN: 9 mg/dL (ref 6–20)
CO2: 29 mmol/L (ref 22–32)
Calcium: 9.7 mg/dL (ref 8.9–10.3)
Chloride: 105 mmol/L (ref 101–111)
Creatinine, Ser: 1.28 mg/dL — ABNORMAL HIGH (ref 0.44–1.00)
GFR calc Af Amer: 45 mL/min — ABNORMAL LOW (ref >60)
GFR calc non Af Amer: 38 mL/min — ABNORMAL LOW (ref >60)
Glucose, Bld: 95 mg/dL (ref 65–99)
Potassium: 3.9 mmol/L (ref 3.5–5.1)
Sodium: 143 mmol/L (ref 135–145)

## 2015-07-27 MED ORDER — HYDROCODONE-ACETAMINOPHEN 5-325 MG PO TABS
1.0000 | ORAL_TABLET | ORAL | Status: DC | PRN
  Administered 2015-07-28: 1 via ORAL
  Filled 2015-07-27: qty 1

## 2015-07-27 MED ORDER — KETOROLAC TROMETHAMINE 30 MG/ML IJ SOLN
30.0000 mg | Freq: Four times a day (QID) | INTRAMUSCULAR | Status: DC
  Filled 2015-07-27: qty 1

## 2015-07-27 MED ORDER — CYCLOBENZAPRINE HCL 10 MG PO TABS
10.0000 mg | ORAL_TABLET | Freq: Once | ORAL | Status: AC
  Administered 2015-07-27: 10 mg via ORAL
  Filled 2015-07-27: qty 1

## 2015-07-27 MED ORDER — ENOXAPARIN SODIUM 40 MG/0.4ML ~~LOC~~ SOLN
40.0000 mg | SUBCUTANEOUS | Status: DC
  Administered 2015-07-27 – 2015-07-28 (×2): 40 mg via SUBCUTANEOUS
  Filled 2015-07-27 (×2): qty 0.4

## 2015-07-27 MED ORDER — MORPHINE SULFATE (PF) 2 MG/ML IV SOLN: 2.0000 mg | INTRAVENOUS | Status: DC | PRN

## 2015-07-27 MED ORDER — METRONIDAZOLE 500 MG PO TABS
500.0000 mg | ORAL_TABLET | Freq: Three times a day (TID) | ORAL | Status: DC
  Administered 2015-07-27 – 2015-07-29 (×6): 500 mg via ORAL
  Filled 2015-07-27 (×6): qty 1

## 2015-07-27 MED ORDER — BACLOFEN 10 MG PO TABS
5.0000 mg | ORAL_TABLET | Freq: Every day | ORAL | Status: DC
  Administered 2015-07-27 – 2015-07-28 (×2): 5 mg via ORAL
  Filled 2015-07-27 (×2): qty 1

## 2015-07-27 MED ORDER — MORPHINE SULFATE (PF) 2 MG/ML IV SOLN
2.0000 mg | Freq: Once | INTRAVENOUS | Status: AC
  Administered 2015-07-27: 2 mg via INTRAVENOUS
  Filled 2015-07-27: qty 1

## 2015-07-27 MED ORDER — BISACODYL 10 MG RE SUPP
10.0000 mg | Freq: Once | RECTAL | Status: AC
  Administered 2015-07-27: 10 mg via RECTAL
  Filled 2015-07-27: qty 1

## 2015-07-27 MED ORDER — TIZANIDINE HCL 2 MG PO TABS: 2.0000 mg | ORAL_TABLET | Freq: Every day | ORAL | Status: DC

## 2015-07-27 MED ORDER — CIPROFLOXACIN HCL 500 MG PO TABS
500.0000 mg | ORAL_TABLET | Freq: Every day | ORAL | Status: DC
  Administered 2015-07-28 – 2015-07-29 (×2): 500 mg via ORAL
  Filled 2015-07-27 (×2): qty 1

## 2015-07-27 MED ORDER — LIDOCAINE 5 % EX PTCH
2.0000 | MEDICATED_PATCH | CUTANEOUS | Status: DC
  Administered 2015-07-27: 2 via TRANSDERMAL
  Filled 2015-07-27 (×2): qty 2

## 2015-07-27 MED ORDER — CYCLOBENZAPRINE HCL 5 MG PO TABS: 7.5000 mg | ORAL_TABLET | Freq: Three times a day (TID) | ORAL | Status: DC | PRN

## 2015-07-27 MED ORDER — DOCUSATE SODIUM 100 MG PO CAPS
100.0000 mg | ORAL_CAPSULE | Freq: Two times a day (BID) | ORAL | Status: DC
  Administered 2015-07-27 – 2015-07-28 (×4): 100 mg via ORAL
  Filled 2015-07-27 (×4): qty 1

## 2015-07-27 MED ORDER — KETOROLAC TROMETHAMINE 30 MG/ML IJ SOLN
15.0000 mg | Freq: Four times a day (QID) | INTRAMUSCULAR | Status: DC
  Administered 2015-07-27: 15 mg via INTRAVENOUS
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
## 2015-07-27 NOTE — Progress Notes (Signed)
Patient has continued to complain of lower back and leg pain throughout night. Pain has been uncontrolled by ordered 2 mg of morphine q3 hr. Physician notified and order was given for flexeril 10 mg once, Toradol 30 mg, and a one time extra dose of morphine 2 mg. Patient is laying in bed now with K-pad stated pain is 5/10 now decreased from 10/10. Will continue to monitor. Jimmie Molly, RN

## 2015-07-27 NOTE — Progress Notes (Signed)
ANTIBIOTIC CONSULT NOTE - FOLLOW UP  Pharmacy Consult for Ciprofloxacin Indication: SBO with ileitis  Allergies  Allergen Reactions  . Benicar [Olmesartan] Shortness Of Breath    Must be same manufacturer or SOB symptoms  . Fenofibrate Shortness Of Breath    Must be the same manufacturer or SOB systems  . Sulfa Antibiotics Hives  . Verapamil Shortness Of Breath    Must be same manufacturer or SOB symptoms  . Atorvastatin Other (See Comments)    Cramps in legs  . Other     Pt states that steroid inhalers and a lot of other inhalers make her more SOB.  Pt states that changes in medications have caused her to have sob and chest pressure (change in manufacturer of medication)  . Statins Other (See Comments)    Cramps in legs  . Tobramycin Other (See Comments)    Blurred vision    Patient Measurements: Height: 5' 6.5" (168.9 cm) Weight: 158 lb 1.1 oz (71.7 kg) IBW/kg (Calculated) : 60.45  Vital Signs: Temp: 98.8 F (37.1 C) (08/22 0621) Temp Source: Oral (08/22 0621) BP: 135/72 mmHg (08/22 0621) Pulse Rate: 65 (08/22 0621) Intake/Output from previous day: 08/21 0701 - 08/22 0700 In: 1890 [P.O.:840; IV Piggyback:1050] Out: 1575 [Urine:1575] Intake/Output from this shift: Total I/O In: 360 [P.O.:360] Out: -   Labs:  Recent Labs  07/26/15 0454 07/27/15 0342  WBC  --  5.5  HGB  --  13.7  PLT  --  227  CREATININE 1.17* 1.28*   Estimated Creatinine Clearance: 33.5 mL/min (by C-G formula based on Cr of 1.28). No results for input(s): VANCOTROUGH, VANCOPEAK, VANCORANDOM, GENTTROUGH, GENTPEAK, GENTRANDOM, TOBRATROUGH, TOBRAPEAK, TOBRARND, AMIKACINPEAK, AMIKACINTROU, AMIKACIN in the last 72 hours.   Microbiology: Recent Results (from the past 720 hour(s))  Urine culture     Status: None   Collection Time: 07/24/15  7:41 AM  Result Value Ref Range Status   Specimen Description URINE, RANDOM  Final   Special Requests NONE  Final   Culture NO GROWTH 1 DAY  Final   Report Status 07/25/2015 FINAL  Final    Anti-infectives    Start     Dose/Rate Route Frequency Ordered Stop   07/25/15 0300  ciprofloxacin (CIPRO) IVPB 400 mg     400 mg 200 mL/hr over 60 Minutes Intravenous Every 24 hours 07/24/15 0620     07/24/15 1200  metroNIDAZOLE (FLAGYL) IVPB 500 mg     500 mg 100 mL/hr over 60 Minutes Intravenous Every 8 hours 07/24/15 0615     07/24/15 0245  ciprofloxacin (CIPRO) IVPB 400 mg     400 mg 200 mL/hr over 60 Minutes Intravenous  Once 07/24/15 0243 07/24/15 0437   07/24/15 0245  metroNIDAZOLE (FLAGYL) IVPB 500 mg     500 mg 100 mL/hr over 60 Minutes Intravenous  Once 07/24/15 0243 07/24/15 5631      Assessment: 81 y/o F to start Cipro and Flagyl for SBO with ileitis. Now day #4 of abx for partial SBO with ileitis. Most likely will give 7-10 day course. Afebrile, WBC down to wnl. SCr 1.28, CrCl ~56ml/min. Will change abx to PO today per protocol approved by P&T committee.  Goal of Therapy:  Resolution of infection  Plan:  Change cipro to 500mg  PO daily Change flagyl to 500mg  PO TID Monitor clinical picture, renal function F/U LOT  Gina Bond J 07/27/2015,1:19 PM
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
## 2015-07-28 ENCOUNTER — Inpatient Hospital Stay (HOSPITAL_COMMUNITY): Payer: Medicare Other

## 2015-07-28 DIAGNOSIS — M79606 Pain in leg, unspecified: Secondary | ICD-10-CM

## 2015-07-28 LAB — BASIC METABOLIC PANEL
Anion gap: 4 — ABNORMAL LOW (ref 5–15)
Anion gap: 6 (ref 5–15)
BUN: 20 mg/dL (ref 6–20)
BUN: 16 mg/dL (ref 6–20)
CO2: 27 mmol/L (ref 22–32)
CO2: 28 mmol/L (ref 22–32)
Calcium: 8.7 mg/dL — ABNORMAL LOW (ref 8.9–10.3)
Calcium: 9.3 mg/dL (ref 8.9–10.3)
Chloride: 106 mmol/L (ref 101–111)
Chloride: 105 mmol/L (ref 101–111)
Creatinine, Ser: 1.81 mg/dL — ABNORMAL HIGH (ref 0.44–1.00)
Creatinine, Ser: 1.55 mg/dL — ABNORMAL HIGH (ref 0.44–1.00)
GFR calc Af Amer: 29 mL/min — ABNORMAL LOW (ref >60)
GFR calc Af Amer: 35 mL/min — ABNORMAL LOW (ref >60)
GFR calc non Af Amer: 25 mL/min — ABNORMAL LOW (ref >60)
GFR calc non Af Amer: 30 mL/min — ABNORMAL LOW (ref >60)
Glucose, Bld: 105 mg/dL — ABNORMAL HIGH (ref 65–99)
Glucose, Bld: 100 mg/dL — ABNORMAL HIGH (ref 65–99)
Potassium: 3.9 mmol/L (ref 3.5–5.1)
Potassium: 3.7 mmol/L (ref 3.5–5.1)
Sodium: 137 mmol/L (ref 135–145)
Sodium: 139 mmol/L (ref 135–145)

## 2015-07-28 LAB — MAGNESIUM: Magnesium: 1.8 mg/dL (ref 1.7–2.4)

## 2015-07-28 MED ORDER — BACLOFEN 10 MG PO TABS: 5.0000 mg | ORAL_TABLET | Freq: Every day | ORAL | Status: DC

## 2015-07-28 MED ORDER — BISACODYL 10 MG RE SUPP
10.0000 mg | Freq: Every day | RECTAL | Status: DC
  Administered 2015-07-28: 10 mg via RECTAL
  Filled 2015-07-28: qty 1

## 2015-07-28 MED ORDER — POLYETHYLENE GLYCOL 3350 17 G PO PACK
17.0000 g | PACK | Freq: Every day | ORAL | Status: DC
  Administered 2015-07-28: 17 g via ORAL

## 2015-07-28 MED ORDER — METRONIDAZOLE 500 MG PO TABS: 500.0000 mg | ORAL_TABLET | Freq: Three times a day (TID) | ORAL | Status: DC

## 2015-07-28 MED ORDER — IPRATROPIUM-ALBUTEROL 0.5-2.5 (3) MG/3ML IN SOLN: 3.0000 mL | Freq: Once | RESPIRATORY_TRACT | Status: DC

## 2015-07-28 MED ORDER — CIPROFLOXACIN HCL 500 MG PO TABS: 500.0000 mg | ORAL_TABLET | Freq: Every day | ORAL | Status: DC

## 2015-07-28 MED ORDER — POTASSIUM CHLORIDE 2 MEQ/ML IV SOLN
INTRAVENOUS | Status: AC
  Administered 2015-07-28: 17:00:00 via INTRAVENOUS
  Filled 2015-07-28: qty 1000

## 2015-07-28 NOTE — Progress Notes (Addendum)
*  PRELIMINARY RESULTS* Vascular Ultrasound Lower extremity venous duplex has been completed.  Preliminary findings: negative for DVT. Left baker's cyst.  Landry Mellow, RDMS, RVT  07/28/2015, 2:21 PM
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
## 2015-07-28 NOTE — Care Management Important Message (Signed)
Important Message  Patient Details  Name: Gina Bond MRN: [id number] Date of Birth: May 03, 1932   Medicare Important Message Given:  Yes-second notification given    Ella Bodo, RN 07/28/2015, 3:42 PM

## 2015-07-29 DIAGNOSIS — K529 Noninfective gastroenteritis and colitis, unspecified: Secondary | ICD-10-CM

## 2015-07-29 DIAGNOSIS — J449 Chronic obstructive pulmonary disease, unspecified: Secondary | ICD-10-CM

## 2015-07-29 DIAGNOSIS — K56609 Unspecified intestinal obstruction, unspecified as to partial versus complete obstruction: Secondary | ICD-10-CM | POA: Diagnosis present

## 2015-07-29 DIAGNOSIS — I10 Essential (primary) hypertension: Secondary | ICD-10-CM

## 2015-07-29 DIAGNOSIS — N179 Acute kidney failure, unspecified: Secondary | ICD-10-CM

## 2015-07-29 DIAGNOSIS — K5669 Other intestinal obstruction: Secondary | ICD-10-CM

## 2015-07-29 DIAGNOSIS — N189 Chronic kidney disease, unspecified: Secondary | ICD-10-CM

## 2015-07-29 LAB — BASIC METABOLIC PANEL
Anion gap: 7 (ref 5–15)
BUN: 10 mg/dL (ref 6–20)
CO2: 27 mmol/L (ref 22–32)
Calcium: 8.9 mg/dL (ref 8.9–10.3)
Chloride: 107 mmol/L (ref 101–111)
Creatinine, Ser: 1.36 mg/dL — ABNORMAL HIGH (ref 0.44–1.00)
GFR calc Af Amer: 41 mL/min — ABNORMAL LOW (ref >60)
GFR calc non Af Amer: 36 mL/min — ABNORMAL LOW (ref >60)
Glucose, Bld: 166 mg/dL — ABNORMAL HIGH (ref 65–99)
Potassium: 3.8 mmol/L (ref 3.5–5.1)
Sodium: 141 mmol/L (ref 135–145)

## 2015-07-29 MED ORDER — POLYETHYLENE GLYCOL 3350 17 G PO PACK: 17.0000 g | PACK | Freq: Every day | ORAL | Status: DC

## 2015-07-29 MED ORDER — VERAPAMIL HCL ER 240 MG PO TBCR
240.0000 mg | EXTENDED_RELEASE_TABLET | Freq: Two times a day (BID) | ORAL | Status: DC
  Administered 2015-07-29: 240 mg via ORAL

## 2015-07-29 MED ORDER — DOCUSATE SODIUM 100 MG PO CAPS: 100.0000 mg | ORAL_CAPSULE | Freq: Two times a day (BID) | ORAL | Status: DC

## 2015-07-29 NOTE — Progress Notes (Signed)
Paged Megan PAC at 1440 about clarification on discharge orders.

## 2015-07-29 NOTE — Progress Notes (Signed)
Patient states that she did not bump her head on anything.  I reassessed her pain and she said that there is no change in pain.  Patient stated that it is still a 4 out of a 10  Fabian Sharp, Therapist, sports

## 2015-07-29 NOTE — Progress Notes (Signed)
Daughter [name] called this morning for an update about how Mrs. Bohan did last night.  Patient had a good nigh no pain medications needed.  Daughter said she would be coming to visit later on today
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
## 2015-07-29 NOTE — Progress Notes (Signed)
Patient complained of discomfort on the top of her head.  Patinet's daughter was concerned with blood pressure so I checked the pressure and it was 158/79.  Patient just received blood pressure medication at 1000 and blood pressure is coming down from a previous pressure check.  Patient complains of burning, "raw" area on the top of the head.  Patient states that it does not feel like a headache just a burning, "raw" feeling.  I physically assessed the area on the top of the head where the patient complains about and the area looks normal, no dry, itchy areas.  No redness noted either.  Patient and daughter is concerned that it may be an allergic reaction to a medication that she is taking.  Patient stated that her pain was a 4 out of a 10 and agreed to 650mg  of tylenol.  Tylenol was given and will reassess pain.  Fabian Sharp, RN
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
## 2015-07-29 NOTE — Progress Notes (Signed)
Patient was given after visit summary(AVS) and there were no further questions from the patient.  AVS was signed and patient's home medications that were sent to the pharmacy upon admission were returned to the patient. Documentation was provided that medications were given back to the patient and patient signed the documentation.   Fabian Sharp, RN

## 2015-11-25 ENCOUNTER — Other Ambulatory Visit: Payer: Self-pay | Admitting: Cardiology

## 2015-11-25 DIAGNOSIS — R1013 Epigastric pain: Secondary | ICD-10-CM

## 2015-12-04 ENCOUNTER — Ambulatory Visit
Admission: RE | Admit: 2015-12-04 | Discharge: 2015-12-04 | Disposition: A | Discharge status: Home / Self Care | Payer: Medicare Other | Source: Ambulatory Visit | Attending: Cardiology | Admitting: Cardiology

## 2015-12-04 DIAGNOSIS — R1013 Epigastric pain: Secondary | ICD-10-CM

## 2015-12-04 MED ORDER — IOPAMIDOL (ISOVUE-300) INJECTION 61%
70.0000 mL | Freq: Once | INTRAVENOUS | Status: AC | PRN
  Administered 2015-12-04: 70 mL via INTRAVENOUS

## 2015-12-06 ENCOUNTER — Inpatient Hospital Stay (HOSPITAL_COMMUNITY)
Admission: EM | Admit: 2015-12-06 | Discharge: 2015-12-09 | DRG: 392 | Disposition: A | Discharge status: Home / Self Care | Payer: Medicare Other | Attending: Internal Medicine | Admitting: Internal Medicine

## 2015-12-06 ENCOUNTER — Encounter (HOSPITAL_COMMUNITY): Payer: Self-pay | Admitting: *Deleted

## 2015-12-06 ENCOUNTER — Encounter (HOSPITAL_BASED_OUTPATIENT_CLINIC_OR_DEPARTMENT_OTHER): Payer: Self-pay | Admitting: *Deleted

## 2015-12-06 ENCOUNTER — Emergency Department (HOSPITAL_BASED_OUTPATIENT_CLINIC_OR_DEPARTMENT_OTHER)
Admission: EM | Admit: 2015-12-06 | Discharge: 2015-12-06 | Disposition: A | Discharge status: Home / Self Care | Payer: Medicare Other | Source: Home / Self Care | Attending: Emergency Medicine | Admitting: Emergency Medicine

## 2015-12-06 ENCOUNTER — Emergency Department (HOSPITAL_BASED_OUTPATIENT_CLINIC_OR_DEPARTMENT_OTHER): Payer: Medicare Other

## 2015-12-06 DIAGNOSIS — R1030 Lower abdominal pain, unspecified: Secondary | ICD-10-CM | POA: Diagnosis present

## 2015-12-06 DIAGNOSIS — K529 Noninfective gastroenteritis and colitis, unspecified: Principal | ICD-10-CM

## 2015-12-06 DIAGNOSIS — R109 Unspecified abdominal pain: Secondary | ICD-10-CM

## 2015-12-06 DIAGNOSIS — E860 Dehydration: Secondary | ICD-10-CM | POA: Diagnosis present

## 2015-12-06 DIAGNOSIS — Z9049 Acquired absence of other specified parts of digestive tract: Secondary | ICD-10-CM | POA: Diagnosis not present

## 2015-12-06 DIAGNOSIS — J441 Chronic obstructive pulmonary disease with (acute) exacerbation: Secondary | ICD-10-CM | POA: Diagnosis present

## 2015-12-06 DIAGNOSIS — E785 Hyperlipidemia, unspecified: Secondary | ICD-10-CM | POA: Diagnosis present

## 2015-12-06 DIAGNOSIS — I2510 Atherosclerotic heart disease of native coronary artery without angina pectoris: Secondary | ICD-10-CM | POA: Diagnosis present

## 2015-12-06 DIAGNOSIS — N179 Acute kidney failure, unspecified: Secondary | ICD-10-CM | POA: Diagnosis present

## 2015-12-06 DIAGNOSIS — I10 Essential (primary) hypertension: Secondary | ICD-10-CM | POA: Diagnosis not present

## 2015-12-06 DIAGNOSIS — I129 Hypertensive chronic kidney disease with stage 1 through stage 4 chronic kidney disease, or unspecified chronic kidney disease: Secondary | ICD-10-CM | POA: Diagnosis present

## 2015-12-06 DIAGNOSIS — R0902 Hypoxemia: Secondary | ICD-10-CM | POA: Diagnosis present

## 2015-12-06 DIAGNOSIS — J449 Chronic obstructive pulmonary disease, unspecified: Secondary | ICD-10-CM | POA: Diagnosis present

## 2015-12-06 DIAGNOSIS — Z8701 Personal history of pneumonia (recurrent): Secondary | ICD-10-CM | POA: Diagnosis not present

## 2015-12-06 DIAGNOSIS — Z87891 Personal history of nicotine dependence: Secondary | ICD-10-CM

## 2015-12-06 DIAGNOSIS — N1832 Chronic kidney disease, stage 3b: Secondary | ICD-10-CM | POA: Diagnosis present

## 2015-12-06 DIAGNOSIS — K219 Gastro-esophageal reflux disease without esophagitis: Secondary | ICD-10-CM | POA: Diagnosis present

## 2015-12-06 DIAGNOSIS — N183 Chronic kidney disease, stage 3 unspecified: Secondary | ICD-10-CM | POA: Diagnosis present

## 2015-12-06 DIAGNOSIS — Z7982 Long term (current) use of aspirin: Secondary | ICD-10-CM | POA: Diagnosis not present

## 2015-12-06 HISTORY — DX: Disorder of kidney and ureter, unspecified: N28.9

## 2015-12-06 LAB — COMPREHENSIVE METABOLIC PANEL
ALT: 23 U/L (ref 14–54)
ALT: 21 U/L (ref 14–54)
AST: 31 U/L (ref 15–41)
AST: 26 U/L (ref 15–41)
Albumin: 4.3 g/dL (ref 3.5–5.0)
Albumin: 3.9 g/dL (ref 3.5–5.0)
Alkaline Phosphatase: 33 U/L — ABNORMAL LOW (ref 38–126)
Alkaline Phosphatase: 30 U/L — ABNORMAL LOW (ref 38–126)
Anion gap: 7 (ref 5–15)
Anion gap: 10 (ref 5–15)
BUN: 16 mg/dL (ref 6–20)
BUN: 14 mg/dL (ref 6–20)
CO2: 29 mmol/L (ref 22–32)
CO2: 28 mmol/L (ref 22–32)
Calcium: 9.9 mg/dL (ref 8.9–10.3)
Calcium: 9.7 mg/dL (ref 8.9–10.3)
Chloride: 104 mmol/L (ref 101–111)
Chloride: 104 mmol/L (ref 101–111)
Creatinine, Ser: 1.46 mg/dL — ABNORMAL HIGH (ref 0.44–1.00)
Creatinine, Ser: 1.57 mg/dL — ABNORMAL HIGH (ref 0.44–1.00)
GFR calc Af Amer: 38 mL/min — ABNORMAL LOW (ref >60)
GFR calc Af Amer: 35 mL/min — ABNORMAL LOW (ref >60)
GFR calc non Af Amer: 33 mL/min — ABNORMAL LOW (ref >60)
GFR calc non Af Amer: 30 mL/min — ABNORMAL LOW (ref >60)
Glucose, Bld: 122 mg/dL — ABNORMAL HIGH (ref 65–99)
Glucose, Bld: 138 mg/dL — ABNORMAL HIGH (ref 65–99)
Potassium: 3.9 mmol/L (ref 3.5–5.1)
Potassium: 4 mmol/L (ref 3.5–5.1)
Sodium: 140 mmol/L (ref 135–145)
Sodium: 142 mmol/L (ref 135–145)
Total Bilirubin: 0.6 mg/dL (ref 0.3–1.2)
Total Bilirubin: 0.5 mg/dL (ref 0.3–1.2)
Total Protein: 6.8 g/dL (ref 6.5–8.1)
Total Protein: 6.4 g/dL — ABNORMAL LOW (ref 6.5–8.1)

## 2015-12-06 LAB — URINE MICROSCOPIC-ADD ON

## 2015-12-06 LAB — URINALYSIS, ROUTINE W REFLEX MICROSCOPIC
Bilirubin Urine: NEGATIVE
Glucose, UA: NEGATIVE mg/dL
Glucose, UA: NEGATIVE mg/dL
Hgb urine dipstick: NEGATIVE
Hgb urine dipstick: NEGATIVE
Ketones, ur: NEGATIVE mg/dL
Ketones, ur: NEGATIVE mg/dL
Leukocytes, UA: NEGATIVE
Nitrite: NEGATIVE
Nitrite: NEGATIVE
Protein, ur: NEGATIVE mg/dL
Protein, ur: 30 mg/dL — AB
Specific Gravity, Urine: 1.011 (ref 1.005–1.030)
Specific Gravity, Urine: 1.025 (ref 1.005–1.030)
pH: 7.5 (ref 5.0–8.0)
pH: 5 (ref 5.0–8.0)

## 2015-12-06 LAB — CBC
HCT: 43 % (ref 36.0–46.0)
Hemoglobin: 13.6 g/dL (ref 12.0–15.0)
MCH: 29.2 pg (ref 26.0–34.0)
MCHC: 31.6 g/dL (ref 30.0–36.0)
MCV: 92.5 fL (ref 78.0–100.0)
Platelets: 262 10*3/uL (ref 150–400)
RBC: 4.65 MIL/uL (ref 3.87–5.11)
RDW: 14 % (ref 11.5–15.5)
WBC: 7.3 10*3/uL (ref 4.0–10.5)

## 2015-12-06 LAB — CBC WITH DIFFERENTIAL/PLATELET
Basophils Absolute: 0 10*3/uL (ref 0.0–0.1)
Basophils Relative: 0 %
Eosinophils Absolute: 0.2 10*3/uL (ref 0.0–0.7)
Eosinophils Relative: 3 %
HCT: 43.8 % (ref 36.0–46.0)
Hemoglobin: 14 g/dL (ref 12.0–15.0)
Lymphocytes Relative: 17 %
Lymphs Abs: 1.1 10*3/uL (ref 0.7–4.0)
MCH: 29.4 pg (ref 26.0–34.0)
MCHC: 32 g/dL (ref 30.0–36.0)
MCV: 92 fL (ref 78.0–100.0)
Monocytes Absolute: 0.4 10*3/uL (ref 0.1–1.0)
Monocytes Relative: 6 %
Neutro Abs: 4.9 10*3/uL (ref 1.7–7.7)
Neutrophils Relative %: 74 %
Platelets: 275 10*3/uL (ref 150–400)
RBC: 4.76 MIL/uL (ref 3.87–5.11)
RDW: 14 % (ref 11.5–15.5)
WBC: 6.7 10*3/uL (ref 4.0–10.5)

## 2015-12-06 LAB — LIPASE, BLOOD
Lipase: 47 U/L (ref 11–51)
Lipase: 41 U/L (ref 11–51)

## 2015-12-06 MED ORDER — CIPROFLOXACIN HCL 500 MG PO TABS: 500.0000 mg | ORAL_TABLET | Freq: Two times a day (BID) | ORAL | Status: DC

## 2015-12-06 MED ORDER — ONDANSETRON 4 MG PO TBDP: ORAL_TABLET | ORAL | Status: DC

## 2015-12-06 MED ORDER — IOHEXOL 300 MG/ML  SOLN
25.0000 mL | Freq: Once | INTRAMUSCULAR | Status: AC | PRN
  Administered 2015-12-06: 25 mL via ORAL

## 2015-12-06 MED ORDER — SODIUM CHLORIDE 0.9 % IV BOLUS (SEPSIS)
1000.0000 mL | Freq: Once | INTRAVENOUS | Status: AC
  Administered 2015-12-06: 1000 mL via INTRAVENOUS

## 2015-12-06 MED ORDER — FENTANYL CITRATE (PF) 100 MCG/2ML IJ SOLN
50.0000 ug | Freq: Once | INTRAMUSCULAR | Status: AC
  Administered 2015-12-06: 50 ug via INTRAVENOUS
  Filled 2015-12-06: qty 2

## 2015-12-06 MED ORDER — ONDANSETRON HCL 4 MG/2ML IJ SOLN
4.0000 mg | Freq: Once | INTRAMUSCULAR | Status: AC
  Administered 2015-12-06: 4 mg via INTRAVENOUS
  Filled 2015-12-06: qty 2

## 2015-12-06 MED ORDER — HYDROCODONE-ACETAMINOPHEN 5-325 MG PO TABS: 1.0000 | ORAL_TABLET | ORAL | Status: DC | PRN

## 2015-12-06 MED ORDER — METRONIDAZOLE 500 MG PO TABS
500.0000 mg | ORAL_TABLET | Freq: Two times a day (BID) | ORAL | Status: DC
Start: 2015-12-06 — End: 2015-12-09

## 2015-12-06 MED ORDER — MORPHINE SULFATE (PF) 4 MG/ML IV SOLN
4.0000 mg | Freq: Once | INTRAVENOUS | Status: AC
  Administered 2015-12-06: 4 mg via INTRAVENOUS
  Filled 2015-12-06: qty 1

## 2015-12-06 MED ORDER — HYDROCODONE-ACETAMINOPHEN 5-325 MG PO TABS
2.0000 | ORAL_TABLET | Freq: Once | ORAL | Status: AC
  Administered 2015-12-06: 2 via ORAL
  Filled 2015-12-06: qty 2

## 2015-12-06 MED ORDER — ONDANSETRON 4 MG PO TBDP
4.0000 mg | ORAL_TABLET | Freq: Once | ORAL | Status: AC | PRN
Start: 2015-12-06 — End: 2015-12-06
  Administered 2015-12-06: 4 mg via ORAL

## 2015-12-06 MED ORDER — ONDANSETRON 4 MG PO TBDP
ORAL_TABLET | ORAL | Status: AC
  Filled 2015-12-06: qty 1

## 2015-12-06 NOTE — ED Provider Notes (Signed)
CSN: [id number]     Arrival date & time 12/06/15  1740 History   First MD Initiated Contact with Patient 12/06/15 2110     Chief Complaint  Patient presents with  . Abdominal Pain  . Emesis   82 year old Caucasian female with PMH of COPD, HTN, HLD, CKD3, several prior abdominal surgeries and prior partial SBO who presents with 2 days of abdominal pain, worsening. Pain located in the upper abdomen, sharp and achy, coming in waves. Nausea and vomiting, 7 times today. Presents to [facility] ED earlier today were CT abdomen and pelvis showed no SBO but showed possible colitis. She was written prescription for antibiotics and told to return if pain worsen. Patient endorses that after going home she has not been able tolerate any thing by mouth and the pain is worsening. Has not passed gas in 2 days. Last BM was 2 days ago. Pain is 10 out of 10. Denies any chest pain, dysuria, hematuria, fever, chills.   (Consider location/radiation/quality/duration/timing/severity/associated sxs/prior Treatment) Patient is a 82 y.o. female presenting with abdominal pain.  Abdominal Pain Pain location:  Generalized Pain quality: aching, sharp and shooting   Pain radiates to:  Does not radiate Pain severity:  Severe Onset quality:  Sudden Duration:  2 days Timing:  Constant Progression:  Worsening Associated symptoms: nausea and vomiting   Associated symptoms: no chest pain, no chills, no constipation, no diarrhea, no dysuria, no fever and no shortness of breath     Past Medical History  Diagnosis Date  . Pneumonia, organism unspecified   . COPD (chronic obstructive pulmonary disease) ([place])   . Hypertension   . Renal disorder   . Hyperlipidemia   . Renal insufficiency    Past Surgical History  Procedure Laterality Date  . Cholecystectomy    . Nerve ablasion    . Appendectomy    . Retinal detachment surgery    . Tonsillectomy     Family History  Problem Relation Age of Onset  . Emphysema Mother     smoked  . Emphysema Sister     smoked  . Heart disease Father   . Lung cancer Brother     smoked a pipe- with mets to bone  . Stroke Mother    Social History  Substance Use Topics  . Smoking status: Former Smoker -- 2.00 packs/day for 57 years    Types: Cigarettes    Quit date: 02/02/2010  . Smokeless tobacco: None  . Alcohol Use: No   OB History    No data available     Review of Systems  Constitutional: Negative for fever and chills.  Respiratory: Negative for shortness of breath.   Cardiovascular: Negative for chest pain, palpitations and leg swelling.  Gastrointestinal: Positive for nausea, vomiting, abdominal pain and abdominal distention. Negative for diarrhea and constipation.  Genitourinary: Negative for dysuria, frequency, flank pain and decreased urine volume.  Neurological: Negative for dizziness, speech difficulty, light-headedness and headaches.  All other systems reviewed and are negative.     Allergies  Benicar; Fenofibrate; Sulfa antibiotics; Sulfacetamide sodium; Verapamil; Atorvastatin; Magnesium; Rosuvastatin calcium; Statins; Other; Sulfamethoxazole; and Tobramycin  Home Medications   Prior to Admission medications   Medication Sig Start Date End Date Taking? Authorizing Provider  aspirin 81 MG tablet Take 81 mg by mouth at bedtime.    Yes Historical Provider, MD  Cholecalciferol (VITAMIN D3) 5000 UNITS TABS Take 1 tablet by mouth daily.   Yes Historical Provider, MD  ciprofloxacin (CIPRO) 500 MG  tablet Take 1 tablet (500 mg total) by mouth 2 (two) times daily. One po bid x 7 days 12/06/15  Yes Sherwood Gambler, MD  docusate sodium (COLACE) 100 MG capsule Take 1 capsule (100 mg total) by mouth 2 (two) times daily. 07/29/15  Yes Eugenie Filler, MD  ezetimibe (ZETIA) 10 MG tablet Take 10 mg by mouth daily.   Yes Historical Provider, MD  fenofibrate micronized (LOFIBRA) 134 MG capsule Take 134 mg by mouth daily.    Yes Historical Provider, MD  fluvastatin  (LESCOL) 40 MG capsule Take 40 mg by mouth at bedtime.  07/15/15  Yes Historical Provider, MD  hydrALAZINE (APRESOLINE) 25 MG tablet Take 50 mg by mouth 3 (three) times daily.    Yes Historical Provider, MD  HYDROcodone-acetaminophen (NORCO) 5-325 MG tablet Take 1-2 tablets by mouth every 4 (four) hours as needed for severe pain. 12/06/15  Yes Sherwood Gambler, MD  isosorbide mononitrate (IMDUR) 120 MG 24 hr tablet Take 120 mg by mouth daily.  09/19/13  Yes Historical Provider, MD  metroNIDAZOLE (FLAGYL) 500 MG tablet Take 1 tablet (500 mg total) by mouth 2 (two) times daily. One po bid x 7 days 12/06/15  Yes Sherwood Gambler, MD  Multiple Vitamins-Minerals (CENTRUM SILVER PO) Take 1 tablet by mouth daily.   Yes Historical Provider, MD  nebivolol (BYSTOLIC) 5 MG tablet Take 5 mg by mouth daily as needed (if heart rate >150).   Yes Historical Provider, MD  olmesartan-hydrochlorothiazide (BENICAR HCT) 20-12.5 MG tablet Take 0.5 tablets by mouth daily. 07/09/13  Yes Historical Provider, MD  omeprazole (PRILOSEC) 40 MG capsule Take 40 mg by mouth daily. 01/02/15  Yes Historical Provider, MD  ondansetron (ZOFRAN ODT) 4 MG disintegrating tablet 4mg  ODT q4 hours prn nausea/vomit Patient taking differently: Take 4 mg by mouth every 4 (four) hours as needed for nausea or vomiting. 4mg  ODT q4 hours prn nausea/vomit 12/06/15  Yes Sherwood Gambler, MD  polyethylene glycol (MIRALAX / GLYCOLAX) packet Take 17 g by mouth daily. 07/29/15  Yes Eugenie Filler, MD  verapamil (VERELAN PM) 240 MG 24 hr capsule Take 240 mg by mouth 2 (two) times daily.    Yes Historical Provider, MD  acetaminophen (TYLENOL) 500 MG tablet Take 1 tablet (500 mg total) by mouth every 6 (six) hours as needed. Patient taking differently: Take 500 mg by mouth daily as needed for mild pain.  06/13/14   Ernestina Patches, MD  levalbuterol [facility] HFA) 45 MCG/ACT inhaler Inhale 2 puffs into the lungs every 4 (four) hours as needed for wheezing or shortness of  breath. Patient not taking: Reported on 12/06/2015 12/10/14   Delfina Redwood, MD   BP 126/55 mmHg  Pulse 84  Temp(Src) 98.1 F (36.7 C) (Oral)  Resp 12  SpO2 91% Physical Exam  Constitutional: She is oriented to person, place, and time. She appears well-developed and well-nourished. No distress.  HENT:  Head: Normocephalic and atraumatic.  Dry mucus membranes  Cardiovascular: Normal rate, regular rhythm, normal heart sounds and intact distal pulses.  Exam reveals no gallop and no friction rub.   No murmur heard. Pulmonary/Chest: Effort normal and breath sounds normal. No respiratory distress. She has no wheezes. She has no rales. She exhibits no tenderness.  Abdominal: Soft. Bowel sounds are normal. She exhibits no distension and no mass. There is tenderness (generalized). There is guarding. There is no rebound.  Musculoskeletal: She exhibits no edema or tenderness.  Lymphadenopathy:    She has no cervical  adenopathy.  Neurological: She is oriented to person, place, and time. No cranial nerve deficit. Coordination normal.  Skin: Skin is warm and dry. She is not diaphoretic.  Nursing note and vitals reviewed.   ED Course  Procedures (including critical care time) Labs Review Labs Reviewed  COMPREHENSIVE METABOLIC PANEL - Abnormal; Notable for the following:    Glucose, Bld 138 (*)    Creatinine, Ser 1.57 (*)    Total Protein 6.4 (*)    Alkaline Phosphatase 30 (*)    GFR calc non Af Amer 30 (*)    GFR calc Af Amer 35 (*)    All other components within normal limits  LIPASE, BLOOD  CBC  URINALYSIS, ROUTINE W REFLEX MICROSCOPIC (NOT AT [facility])    Imaging Review Ct Abdomen Pelvis Wo Contrast  12/06/2015  CLINICAL DATA:  Acute lower abdominal pain. EXAM: CT ABDOMEN AND PELVIS WITHOUT CONTRAST TECHNIQUE: Multidetector CT imaging of the abdomen and pelvis was performed following the standard protocol without IV contrast. COMPARISON:  CT scan of December 04, 2015 and July 24, 2015.  FINDINGS: Visualized lung bases are unremarkable. No significant osseous abnormality is noted. Status post cholecystectomy. No focal abnormality is noted in the liver, spleen or pancreas on these unenhanced images. Stable small right adrenal adenoma. Left adrenal gland appears normal. Left kidney and ureter appear normal. No hydronephrosis or renal obstruction is noted. Severe right renal atrophy is again noted. No abnormal fluid collection is noted. Atherosclerosis of abdominal aorta is noted. 3.9 cm proximal abdominal aortic aneurysm is noted. Postsurgical changes are seen in the right pelvis. Patient appears to be status post hysterectomy. Urinary bladder is unremarkable. Mild inflammatory changes are noted in the right lower quadrant which may represent focal inflammation of the terminal ileum. Large amount of contrast is seen throughout the colon suggesting no significant obstruction. Urinary bladder appears normal. IMPRESSION: Severe right renal atrophy is noted. Stable right adrenal adenoma. Atherosclerosis of abdominal aorta is noted with stable 3.9 cm proximal abdominal aortic aneurysm. Postsurgical changes are noted in the right lower quadrant. Minimal inflammatory changes and small bowel wall thickening is noted in right lower quadrant suggesting focal ileitis or enteritis. Large amount of contrast is noted in the colon suggesting no significant obstruction. Electronically Signed   By: Marijo Conception, M.D.   On: 12/06/2015 10:26   I have personally reviewed and evaluated these images and lab results as part of my medical decision-making.   EKG Interpretation None      MDM   Final diagnoses:  Ileitis  Dehydration    82 year old Caucasian female here with abdominal pain. Please see history of present illness for details. On exam patient in NAD, AFVSS. Dry mucous membranes. Abdomen tender with voluntary guarding. Rectal exam reveals no stool burden.  Workup today included CT abdomen and  pelvis, urine, labs. These weren't reassuring. Feel patient warrants inpatient observation. Given history of partial SBO and her inability to tolerate by mouth, will need admission for rehydration and pain control.  Pt was seen under the supervision of Dr. Kathrynn Humble.     Sherian Maroon, MD 12/06/15 [place], MD 12/09/15 [id number]

## 2015-12-06 NOTE — ED Notes (Signed)
Family states that this past Friday had a CT scan at [facility]

## 2015-12-06 NOTE — ED Notes (Signed)
MD at bedside. 

## 2015-12-06 NOTE — ED Notes (Signed)
PT NPO IMMEDIATELY
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
## 2015-12-06 NOTE — ED Notes (Addendum)
Pt reports abd pain and distention since last night. Having n/v. Denies diarrhea. Last bm was yesterday. Hx of SBO. Was seen at [facility] today for same and told negative for bowel obstruction. spo2 89% at triage.

## 2015-12-06 NOTE — ED Provider Notes (Signed)
CSN: [id number]     Arrival date & time 12/06/15  [id number] History   First MD Initiated Contact with Patient 12/06/15 0827     Chief Complaint  Patient presents with  . Abdominal Pain     (Consider location/radiation/quality/duration/timing/severity/associated sxs/prior Treatment) HPI  82 year old female presents with abdominal pain, bloating, and vomiting since yesterday. Had a bowel movement yesterday morning, was not quite normal for her. She has had a partial small bowel obstruction in the past and this feels similar. Patient has vomited a total of 5 times, states it was brown but no blood. Denies fevers. Has been urinating less and states one time it was painful. Patient's pain is diffuse. Patient had a CT scan on 12/30, this was ordered by her cardiologist for epigastric pain and radiation up her neck as well as abnormal liver tests (they do not know or have the values). She states the pain she is having now did not start until the day after (yesterday).  Past Medical History  Diagnosis Date  . Pneumonia, organism unspecified   . COPD (chronic obstructive pulmonary disease) ([place])   . Hypertension   . Renal disorder   . Hyperlipidemia    Past Surgical History  Procedure Laterality Date  . Cholecystectomy    . Nerve ablasion    . Appendectomy    . Retinal detachment surgery    . Tonsillectomy     Family History  Problem Relation Age of Onset  . Emphysema Mother     smoked  . Emphysema Sister     smoked  . Heart disease Father   . Lung cancer Brother     smoked a pipe- with mets to bone  . Stroke Mother    Social History  Substance Use Topics  . Smoking status: Former Smoker -- 2.00 packs/day for 57 years    Types: Cigarettes    Quit date: 02/02/2010  . Smokeless tobacco: None  . Alcohol Use: No   OB History    No data available     Review of Systems  Constitutional: Negative for fever.  Gastrointestinal: Positive for nausea, vomiting, abdominal pain and abdominal  distention. Negative for diarrhea.  Genitourinary: Positive for decreased urine volume.  All other systems reviewed and are negative.     Allergies  Benicar; Fenofibrate; Sulfa antibiotics; Verapamil; Atorvastatin; Other; Statins; and Tobramycin  Home Medications   Prior to Admission medications   Medication Sig Start Date End Date Taking? Authorizing Provider  acetaminophen (TYLENOL) 500 MG tablet Take 1 tablet (500 mg total) by mouth every 6 (six) hours as needed. Patient taking differently: Take 1,000 mg by mouth daily as needed for mild pain.  06/13/14   Ernestina Patches, MD  aspirin 81 MG tablet Take 81 mg by mouth at bedtime.     Historical Provider, MD  Cholecalciferol (VITAMIN D3) 5000 UNITS TABS Take 1 tablet by mouth daily.    Historical Provider, MD  ciprofloxacin (CIPRO) 500 MG tablet Take 1 tablet (500 mg total) by mouth daily with breakfast. 07/28/15   Orson Eva, MD  docusate sodium (COLACE) 100 MG capsule Take 1 capsule (100 mg total) by mouth 2 (two) times daily. 07/29/15   Eugenie Filler, MD  ezetimibe (ZETIA) 10 MG tablet Take 10 mg by mouth daily.    Historical Provider, MD  famotidine (PEPCID) 20 MG tablet Take 20 mg by mouth at bedtime as needed for heartburn or indigestion.     Historical Provider, MD  fenofibrate micronized (  LOFIBRA) 134 MG capsule Take 134 mg by mouth daily.     Historical Provider, MD  fluvastatin (LESCOL) 40 MG capsule Take 40 mg by mouth daily. 07/15/15   Historical Provider, MD  hydrALAZINE (APRESOLINE) 25 MG tablet Take 25 mg by mouth 3 (three) times daily.    Historical Provider, MD  isosorbide mononitrate (IMDUR) 120 MG 24 hr tablet Take 1 tablet by mouth daily. 09/19/13   Historical Provider, MD  levalbuterol Penne Lash HFA) 45 MCG/ACT inhaler Inhale 2 puffs into the lungs every 4 (four) hours as needed for wheezing or shortness of breath. 12/10/14   Delfina Redwood, MD  metroNIDAZOLE (FLAGYL) 500 MG tablet Take 1 tablet (500 mg total) by mouth  every 8 (eight) hours. 07/28/15   Orson Eva, MD  Multiple Vitamins-Minerals (CENTRUM SILVER PO) Take 1 tablet by mouth daily.    Historical Provider, MD  polyethylene glycol (MIRALAX / GLYCOLAX) packet Take 17 g by mouth daily. 07/29/15   Eugenie Filler, MD  verapamil (VERELAN PM) 240 MG 24 hr capsule Take 240 mg by mouth 2 (two) times daily.     Historical Provider, MD   BP 158/78 mmHg  Pulse 82  Temp(Src) 97.8 F (36.6 C) (Oral)  Resp 16  Ht 5\' 6"  (1.676 m)  Wt 150 lb (68.04 kg)  BMI 24.22 kg/m2  SpO2 94% Physical Exam  Constitutional: She is oriented to person, place, and time. She appears well-developed and well-nourished.  HENT:  Head: Normocephalic and atraumatic.  Right Ear: External ear normal.  Left Ear: External ear normal.  Nose: Nose normal.  Eyes: Right eye exhibits no discharge. Left eye exhibits no discharge.  Cardiovascular: Normal rate and regular rhythm.   Murmur heard. Pulmonary/Chest: Effort normal and breath sounds normal.  Abdominal: Soft. There is tenderness (diffuse tenderness (mild), more moderate tenderness in RLQ). There is no rebound.  Multiple scars to mid-lower abdomen  Neurological: She is alert and oriented to person, place, and time.  Skin: Skin is warm and dry. She is not diaphoretic.  Nursing note and vitals reviewed.   ED Course  Procedures (including critical care time) Labs Review Labs Reviewed  COMPREHENSIVE METABOLIC PANEL - Abnormal; Notable for the following:    Glucose, Bld 122 (*)    Creatinine, Ser 1.46 (*)    Alkaline Phosphatase 33 (*)    GFR calc non Af Amer 33 (*)    GFR calc Af Amer 38 (*)    All other components within normal limits  URINALYSIS, ROUTINE W REFLEX MICROSCOPIC (NOT AT [facility])  LIPASE, BLOOD  CBC WITH DIFFERENTIAL/PLATELET    Imaging Review Ct Abdomen Pelvis Wo Contrast  12/06/2015  CLINICAL DATA:  Acute lower abdominal pain. EXAM: CT ABDOMEN AND PELVIS WITHOUT CONTRAST TECHNIQUE: Multidetector CT imaging  of the abdomen and pelvis was performed following the standard protocol without IV contrast. COMPARISON:  CT scan of December 04, 2015 and July 24, 2015. FINDINGS: Visualized lung bases are unremarkable. No significant osseous abnormality is noted. Status post cholecystectomy. No focal abnormality is noted in the liver, spleen or pancreas on these unenhanced images. Stable small right adrenal adenoma. Left adrenal gland appears normal. Left kidney and ureter appear normal. No hydronephrosis or renal obstruction is noted. Severe right renal atrophy is again noted. No abnormal fluid collection is noted. Atherosclerosis of abdominal aorta is noted. 3.9 cm proximal abdominal aortic aneurysm is noted. Postsurgical changes are seen in the right pelvis. Patient appears to be status post hysterectomy. Urinary  bladder is unremarkable. Mild inflammatory changes are noted in the right lower quadrant which may represent focal inflammation of the terminal ileum. Large amount of contrast is seen throughout the colon suggesting no significant obstruction. Urinary bladder appears normal. IMPRESSION: Severe right renal atrophy is noted. Stable right adrenal adenoma. Atherosclerosis of abdominal aorta is noted with stable 3.9 cm proximal abdominal aortic aneurysm. Postsurgical changes are noted in the right lower quadrant. Minimal inflammatory changes and small bowel wall thickening is noted in right lower quadrant suggesting focal ileitis or enteritis. Large amount of contrast is noted in the colon suggesting no significant obstruction. Electronically Signed   By: Marijo Conception, M.D.   On: 12/06/2015 10:26   I have personally reviewed and evaluated these images and lab results as part of my medical decision-making.   EKG Interpretation None      MDM   Final diagnoses:  Lower abdominal pain  Enteritis    Patient CT scan shows no bowel obstruction. Patient's pain has improved in the emergency department. She  initially declined nausea medicine but after this she also feels more improved. There is evidence of an enteritis, thus will treat with Cipro and Flagyl although this could be viral. Given patient feels better, I will recommend a liquid diet and then progression to a normal diet. Follow-up with PCP. Advised of strict return precautions including worsening pain or uncontrolled vomiting.    Sherwood Gambler, MD 12/06/15 [phone]

## 2015-12-06 NOTE — ED Notes (Signed)
Presents with abd pain, onset yesterday in late afternoon. Has some N/V as well

## 2015-12-06 NOTE — ED Notes (Signed)
Pt placed on heart monitor and continuous pulse ox

## 2015-12-07 ENCOUNTER — Inpatient Hospital Stay (HOSPITAL_COMMUNITY): Payer: Medicare Other

## 2015-12-07 DIAGNOSIS — J441 Chronic obstructive pulmonary disease with (acute) exacerbation: Secondary | ICD-10-CM

## 2015-12-07 DIAGNOSIS — N179 Acute kidney failure, unspecified: Secondary | ICD-10-CM

## 2015-12-07 DIAGNOSIS — R0902 Hypoxemia: Secondary | ICD-10-CM | POA: Insufficient documentation

## 2015-12-07 DIAGNOSIS — K219 Gastro-esophageal reflux disease without esophagitis: Secondary | ICD-10-CM

## 2015-12-07 LAB — COMPREHENSIVE METABOLIC PANEL
ALT: 18 U/L (ref 14–54)
AST: 26 U/L (ref 15–41)
Albumin: 3.1 g/dL — ABNORMAL LOW (ref 3.5–5.0)
Alkaline Phosphatase: 26 U/L — ABNORMAL LOW (ref 38–126)
Anion gap: 8 (ref 5–15)
BUN: 14 mg/dL (ref 6–20)
CO2: 30 mmol/L (ref 22–32)
Calcium: 9.1 mg/dL (ref 8.9–10.3)
Chloride: 105 mmol/L (ref 101–111)
Creatinine, Ser: 1.39 mg/dL — ABNORMAL HIGH (ref 0.44–1.00)
GFR calc Af Amer: 40 mL/min — ABNORMAL LOW (ref >60)
GFR calc non Af Amer: 35 mL/min — ABNORMAL LOW (ref >60)
Glucose, Bld: 115 mg/dL — ABNORMAL HIGH (ref 65–99)
Potassium: 3.7 mmol/L (ref 3.5–5.1)
Sodium: 143 mmol/L (ref 135–145)
Total Bilirubin: 0.5 mg/dL (ref 0.3–1.2)
Total Protein: 5.3 g/dL — ABNORMAL LOW (ref 6.5–8.1)

## 2015-12-07 LAB — CBC
HCT: 41 % (ref 36.0–46.0)
Hemoglobin: 12.8 g/dL (ref 12.0–15.0)
MCH: 29.3 pg (ref 26.0–34.0)
MCHC: 31.2 g/dL (ref 30.0–36.0)
MCV: 93.8 fL (ref 78.0–100.0)
Platelets: 248 10*3/uL (ref 150–400)
RBC: 4.37 MIL/uL (ref 3.87–5.11)
RDW: 14.2 % (ref 11.5–15.5)
WBC: 5.8 10*3/uL (ref 4.0–10.5)

## 2015-12-07 LAB — PHOSPHORUS: Phosphorus: 4.1 mg/dL (ref 2.5–4.6)

## 2015-12-07 LAB — MAGNESIUM: Magnesium: 2 mg/dL (ref 1.7–2.4)

## 2015-12-07 LAB — TSH: TSH: 3.813 u[IU]/mL (ref 0.350–4.500)

## 2015-12-07 MED ORDER — ISOSORBIDE MONONITRATE ER 60 MG PO TB24
120.0000 mg | ORAL_TABLET | Freq: Every day | ORAL | Status: DC
  Administered 2015-12-07 – 2015-12-09 (×3): 120 mg via ORAL
  Filled 2015-12-07 (×3): qty 2

## 2015-12-07 MED ORDER — ACETAMINOPHEN 325 MG PO TABS
650.0000 mg | ORAL_TABLET | Freq: Four times a day (QID) | ORAL | Status: DC | PRN
  Administered 2015-12-08: 650 mg via ORAL
  Filled 2015-12-07: qty 2

## 2015-12-07 MED ORDER — ENOXAPARIN SODIUM 30 MG/0.3ML ~~LOC~~ SOLN
30.0000 mg | SUBCUTANEOUS | Status: DC
  Administered 2015-12-07 – 2015-12-09 (×3): 30 mg via SUBCUTANEOUS
  Filled 2015-12-07 (×3): qty 0.3

## 2015-12-07 MED ORDER — HYDROCODONE-ACETAMINOPHEN 5-325 MG PO TABS
1.0000 | ORAL_TABLET | ORAL | Status: DC | PRN
  Administered 2015-12-07 – 2015-12-08 (×3): 2 via ORAL
  Filled 2015-12-07 (×3): qty 2

## 2015-12-07 MED ORDER — ACETAMINOPHEN 650 MG RE SUPP: 650.0000 mg | Freq: Four times a day (QID) | RECTAL | Status: DC | PRN

## 2015-12-07 MED ORDER — EZETIMIBE 10 MG PO TABS
10.0000 mg | ORAL_TABLET | Freq: Every day | ORAL | Status: DC
  Administered 2015-12-07 – 2015-12-09 (×3): 10 mg via ORAL
  Filled 2015-12-07 (×3): qty 1

## 2015-12-07 MED ORDER — METRONIDAZOLE IN NACL 5-0.79 MG/ML-% IV SOLN
500.0000 mg | Freq: Three times a day (TID) | INTRAVENOUS | Status: DC
  Administered 2015-12-07 – 2015-12-09 (×8): 500 mg via INTRAVENOUS
  Filled 2015-12-07 (×9): qty 100

## 2015-12-07 MED ORDER — ONDANSETRON HCL 4 MG PO TABS: 4.0000 mg | ORAL_TABLET | Freq: Four times a day (QID) | ORAL | Status: DC | PRN

## 2015-12-07 MED ORDER — HYDRALAZINE HCL 50 MG PO TABS
50.0000 mg | ORAL_TABLET | Freq: Three times a day (TID) | ORAL | Status: DC
  Administered 2015-12-07 – 2015-12-09 (×3): 50 mg via ORAL
  Filled 2015-12-07 (×3): qty 1

## 2015-12-07 MED ORDER — METHYLPREDNISOLONE SODIUM SUCC 40 MG IJ SOLR
40.0000 mg | Freq: Two times a day (BID) | INTRAMUSCULAR | Status: DC
  Administered 2015-12-07 – 2015-12-08 (×3): 40 mg via INTRAVENOUS
  Filled 2015-12-07 (×3): qty 1

## 2015-12-07 MED ORDER — BUDESONIDE 0.25 MG/2ML IN SUSP
0.2500 mg | Freq: Two times a day (BID) | RESPIRATORY_TRACT | Status: DC
  Administered 2015-12-07 – 2015-12-09 (×4): 0.25 mg via RESPIRATORY_TRACT
  Filled 2015-12-07 (×4): qty 2

## 2015-12-07 MED ORDER — ONDANSETRON HCL 4 MG/2ML IJ SOLN
4.0000 mg | Freq: Four times a day (QID) | INTRAMUSCULAR | Status: DC | PRN
  Administered 2015-12-07: 4 mg via INTRAVENOUS
  Filled 2015-12-07: qty 2

## 2015-12-07 MED ORDER — PANTOPRAZOLE SODIUM 40 MG PO TBEC
40.0000 mg | DELAYED_RELEASE_TABLET | Freq: Every day | ORAL | Status: DC
  Administered 2015-12-07 – 2015-12-08 (×2): 40 mg via ORAL
  Filled 2015-12-07 (×2): qty 1

## 2015-12-07 MED ORDER — SODIUM CHLORIDE 0.9 % IV SOLN
INTRAVENOUS | Status: AC
  Administered 2015-12-07: 02:00:00 via INTRAVENOUS

## 2015-12-07 MED ORDER — ASPIRIN EC 81 MG PO TBEC
81.0000 mg | DELAYED_RELEASE_TABLET | Freq: Every day | ORAL | Status: DC
  Administered 2015-12-07 – 2015-12-08 (×3): 81 mg via ORAL
  Filled 2015-12-07 (×3): qty 1

## 2015-12-07 MED ORDER — NEBIVOLOL HCL 10 MG PO TABS: 5.0000 mg | ORAL_TABLET | Freq: Every day | ORAL | Status: DC | PRN

## 2015-12-07 MED ORDER — IPRATROPIUM BROMIDE 0.02 % IN SOLN
0.5000 mg | Freq: Four times a day (QID) | RESPIRATORY_TRACT | Status: DC
  Administered 2015-12-07 – 2015-12-08 (×6): 0.5 mg via RESPIRATORY_TRACT
  Filled 2015-12-07 (×6): qty 2.5

## 2015-12-07 MED ORDER — POLYETHYLENE GLYCOL 3350 17 G PO PACK
17.0000 g | PACK | Freq: Every day | ORAL | Status: DC
Start: 2015-12-07 — End: 2015-12-09
  Administered 2015-12-09: 17 g via ORAL
  Filled 2015-12-07 (×2): qty 1

## 2015-12-07 MED ORDER — CIPROFLOXACIN IN D5W 400 MG/200ML IV SOLN
400.0000 mg | Freq: Two times a day (BID) | INTRAVENOUS | Status: DC
  Administered 2015-12-07 – 2015-12-09 (×5): 400 mg via INTRAVENOUS
  Filled 2015-12-07 (×6): qty 200

## 2015-12-07 NOTE — Progress Notes (Signed)
[facility] PROGRESS NOTE  Gina Bond [id number] DOB: [birth year] DOA: 12/06/2015 PCP: [name], MD  Assessment/Plan: 1-abd pain and nausea: no obstruction seen on CT; but no BM's and positive abd distension  -since she is not having vomiting and tolerated CLd, will do bowel rest for now and hold on NGT -will continue antibiotics for presumed enteritis  -PRN analgesics and antiemetics -abd x-ray in am  2-acute on chronic renal failure (stage 3): due to dehydration.  -improved with IVF's -will monitor -patient baseline Cr around 1.3  3-COPD Gold II: with mild exacerbation -will initiate pulmicort, flutter valve and steroids  -follow response and continue PRN oxygen supplementation  4-essential HTN: stable currently -will continue home antihypertensive regimen  5-GERD: continue PPI  6-HLD: will continue Zetia  7-GERD: continue PPI  Code Status: Full Family Communication: daughter at bedside  Disposition Plan: remains inpatient, NPO status for bowel rest, increase activity, initiation os solumedrol for her breathing.   Consultants:  None   Procedures:  See below for x-ray reports   Antibiotics:  Cipro 12/06/15  Flagyl 12/06/15  HPI/Subjective: Afebrile, no CP, no vomiting. Feeling slightly nauseous and complaining of abd pain/distension. Patient breathing is tight and she is unable to speak in full sentences. Continue requiring O2 supplementation (2L)   Objective: Filed Vitals:   12/07/15 1405 12/07/15 1406  BP: 129/55 118/55  Pulse: 56 77  Temp:    Resp:      Intake/Output Summary (Last 24 hours) at 12/07/15 1732 Last data filed at 12/07/15 0631  Gross per 24 hour  Intake 533.75 ml  Output     50 ml  Net 483.75 ml   Filed Weights   12/07/15 0000  Weight: 71.1 kg (156 lb 12 oz)    Exam:   General:  Afebrile, complaining of abd pain/distension and also SOB. Unable to speak in full sentences. No BM's  Cardiovascular: S1 and  S2, no rubs, no gallops  Respiratory: poor air movement, scattered rhonchi, mild end exp wheezing. "Patient reported feeling tight"  Abdomen: soft, tender to palpation, positive distension; decrease to absent BS; no guarding   Musculoskeletal: no edema, no cyanosis   Data Reviewed: Basic Metabolic Panel:  Recent Labs Lab 12/06/15 0850 12/06/15 1816 12/07/15 0415  NA 140 142 143  K 3.9 4.0 3.7  CL 104 104 105  CO2 29 28 30   GLUCOSE 122* 138* 115*  BUN 16 14 14   CREATININE 1.46* 1.57* 1.39*  CALCIUM 9.9 9.7 9.1  MG  --   --  2.0  PHOS  --   --  4.1   Liver Function Tests:  Recent Labs Lab 12/06/15 0850 12/06/15 1816 12/07/15 0415  AST 31 26 26   ALT 23 21 18   ALKPHOS 33* 30* 26*  BILITOT 0.6 0.5 0.5  PROT 6.8 6.4* 5.3*  ALBUMIN 4.3 3.9 3.1*    Recent Labs Lab 12/06/15 0850 12/06/15 1816  LIPASE 47 41   CBC:  Recent Labs Lab 12/06/15 0850 12/06/15 1816 12/07/15 0415  WBC 6.7 7.3 5.8  NEUTROABS 4.9  --   --   HGB 14.0 13.6 12.8  HCT 43.8 43.0 41.0  MCV 92.0 92.5 93.8  PLT 275 262 248   BNP (last 3 results)  Recent Labs  12/09/14 1917  BNP 47.7   Studies: Ct Abdomen Pelvis Wo Contrast  12/06/2015  CLINICAL DATA:  Acute lower abdominal pain. EXAM: CT ABDOMEN AND PELVIS WITHOUT CONTRAST TECHNIQUE: Multidetector CT imaging of the abdomen and  pelvis was performed following the standard protocol without IV contrast. COMPARISON:  CT scan of December 04, 2015 and July 24, 2015. FINDINGS: Visualized lung bases are unremarkable. No significant osseous abnormality is noted. Status post cholecystectomy. No focal abnormality is noted in the liver, spleen or pancreas on these unenhanced images. Stable small right adrenal adenoma. Left adrenal gland appears normal. Left kidney and ureter appear normal. No hydronephrosis or renal obstruction is noted. Severe right renal atrophy is again noted. No abnormal fluid collection is noted. Atherosclerosis of abdominal aorta  is noted. 3.9 cm proximal abdominal aortic aneurysm is noted. Postsurgical changes are seen in the right pelvis. Patient appears to be status post hysterectomy. Urinary bladder is unremarkable. Mild inflammatory changes are noted in the right lower quadrant which may represent focal inflammation of the terminal ileum. Large amount of contrast is seen throughout the colon suggesting no significant obstruction. Urinary bladder appears normal. IMPRESSION: Severe right renal atrophy is noted. Stable right adrenal adenoma. Atherosclerosis of abdominal aorta is noted with stable 3.9 cm proximal abdominal aortic aneurysm. Postsurgical changes are noted in the right lower quadrant. Minimal inflammatory changes and small bowel wall thickening is noted in right lower quadrant suggesting focal ileitis or enteritis. Large amount of contrast is noted in the colon suggesting no significant obstruction. Electronically Signed   By: Marijo Conception, M.D.   On: 12/06/2015 10:26   Dg Chest 2 View  12/07/2015  CLINICAL DATA:  Hypoxia.  Vomiting tonight. EXAM: CHEST  2 VIEW COMPARISON:  07/26/2015 FINDINGS: Linear bibasilar opacities, left greater than right. Lower lung volumes from prior exam. The cardiomediastinal contours are normal. Pulmonary vasculature is normal. No consolidation, pleural effusion, or pneumothorax. No acute osseous abnormalities are seen. IMPRESSION: Linear bibasilar atelectasis. Electronically Signed   By: Jeb Levering M.D.   On: 12/07/2015 01:34    Scheduled Meds: . aspirin EC  81 mg Oral QHS  . budesonide (PULMICORT) nebulizer solution  0.25 mg Nebulization BID  . ciprofloxacin  400 mg Intravenous Q12H  . enoxaparin (LOVENOX) injection  30 mg Subcutaneous Q24H  . ezetimibe  10 mg Oral Daily  . hydrALAZINE  50 mg Oral TID  . ipratropium  0.5 mg Nebulization Q6H  . isosorbide mononitrate  120 mg Oral Daily  . methylPREDNISolone (SOLU-MEDROL) injection  40 mg Intravenous Q12H  . metronidazole   500 mg Intravenous Q8H  . pantoprazole  40 mg Oral Daily  . polyethylene glycol  17 g Oral Daily   Continuous Infusions:   Active Problems:   COPD GOLD II    Essential hypertension   Ileitis   CKD (chronic kidney disease) stage 3, GFR 30-59 ml/min   Enteritis   Dehydration   CAD (coronary artery disease)   Hypoxia   Time spent: 35 minutes    Barton Dubois  [facility] Pager [phone]. If 7PM-7AM, please contact night-coverage at [email or web address], password [facility] 12/07/2015, 5:32 PM  LOS: 1 day

## 2015-12-07 NOTE — Care Management Note (Signed)
Case Management Note  Patient Details  Name: Gina Bond MRN: [id number] Date of Birth: 01/12/1932  Subjective/Objective:                    Action/Plan:  Initial UR completed  Expected Discharge Date:                  Expected Discharge Plan:  Home/Self Care  In-House Referral:     Discharge planning Services     Post Acute Care Choice:    Choice offered to:     DME Arranged:    DME Agency:     HH Arranged:    [place] Agency:     Status of Service:  In process, will continue to follow  Medicare Important Message Given:    Date Medicare IM Given:    Medicare IM give by:    Date Additional Medicare IM Given:    Additional Medicare Important Message give by:     If discussed at [place] of Stay Meetings, dates discussed:    Additional Comments:  Marilu Favre, RN 12/07/2015, 8:23 AM

## 2015-12-08 ENCOUNTER — Inpatient Hospital Stay (HOSPITAL_COMMUNITY): Payer: Medicare Other

## 2015-12-08 LAB — CBC
HCT: 37.6 % (ref 36.0–46.0)
Hemoglobin: 12.2 g/dL (ref 12.0–15.0)
MCH: 30.3 pg (ref 26.0–34.0)
MCHC: 32.4 g/dL (ref 30.0–36.0)
MCV: 93.5 fL (ref 78.0–100.0)
Platelets: 221 10*3/uL (ref 150–400)
RBC: 4.02 MIL/uL (ref 3.87–5.11)
RDW: 14 % (ref 11.5–15.5)
WBC: 3.7 10*3/uL — ABNORMAL LOW (ref 4.0–10.5)

## 2015-12-08 LAB — URINE CULTURE

## 2015-12-08 LAB — BASIC METABOLIC PANEL
Anion gap: 4 — ABNORMAL LOW (ref 5–15)
BUN: 14 mg/dL (ref 6–20)
CO2: 30 mmol/L (ref 22–32)
Calcium: 8.7 mg/dL — ABNORMAL LOW (ref 8.9–10.3)
Chloride: 106 mmol/L (ref 101–111)
Creatinine, Ser: 1.38 mg/dL — ABNORMAL HIGH (ref 0.44–1.00)
GFR calc Af Amer: 40 mL/min — ABNORMAL LOW (ref >60)
GFR calc non Af Amer: 35 mL/min — ABNORMAL LOW (ref >60)
Glucose, Bld: 136 mg/dL — ABNORMAL HIGH (ref 65–99)
Potassium: 4.1 mmol/L (ref 3.5–5.1)
Sodium: 140 mmol/L (ref 135–145)

## 2015-12-08 MED ORDER — IPRATROPIUM BROMIDE 0.02 % IN SOLN
0.5000 mg | Freq: Three times a day (TID) | RESPIRATORY_TRACT | Status: DC
  Administered 2015-12-09 (×2): 0.5 mg via RESPIRATORY_TRACT
  Filled 2015-12-08 (×2): qty 2.5

## 2015-12-08 MED ORDER — SIMETHICONE 40 MG/0.6ML PO SUSP
40.0000 mg | Freq: Four times a day (QID) | ORAL | Status: DC | PRN
  Filled 2015-12-08: qty 0.6

## 2015-12-08 MED ORDER — PANTOPRAZOLE SODIUM 40 MG PO TBEC
40.0000 mg | DELAYED_RELEASE_TABLET | Freq: Two times a day (BID) | ORAL | Status: DC
  Administered 2015-12-08 – 2015-12-09 (×2): 40 mg via ORAL
  Filled 2015-12-08 (×2): qty 1

## 2015-12-08 MED ORDER — HYDROCODONE-ACETAMINOPHEN 5-325 MG PO TABS
1.0000 | ORAL_TABLET | Freq: Four times a day (QID) | ORAL | Status: DC | PRN
  Administered 2015-12-09: 2 via ORAL
  Filled 2015-12-08: qty 2

## 2015-12-08 MED ORDER — IPRATROPIUM BROMIDE 0.02 % IN SOLN
RESPIRATORY_TRACT | Status: AC
  Filled 2015-12-08: qty 2.5

## 2015-12-08 MED ORDER — PREDNISONE 20 MG PO TABS
40.0000 mg | ORAL_TABLET | Freq: Every day | ORAL | Status: DC
  Administered 2015-12-09: 40 mg via ORAL
  Filled 2015-12-08: qty 2

## 2015-12-08 NOTE — Progress Notes (Signed)
[facility] PROGRESS NOTE  Gina Bond [id number] DOB: 04-09-1932 DOA: 12/06/2015 PCP: [name], MD  Assessment/Plan: 1-abd pain and nausea: no obstruction seen on CT; but no BM's and positive abd distension on PE.   -since she is improving, have small BM/positive flatus and have reassuring Abd x-rays today, will advance diet to full liquid -will continue antibiotics for presumed enteritis still IV until proven to tolerate PO  -continue PRN analgesics and antiemetics -continue ambulation . -PRN simethicone for obstipation   2-acute on chronic renal failure (stage 3): due to dehydration.  -improved with IVF's -will monitor trend -patient back to baseline   3-COPD Gold II: with mild exacerbation -will continue pulmicort, flutter valve and steroids (last one in transition to PO)  -follow response and continue PRN oxygen supplementation -continue PRN albuterol/atrovent   4-essential HTN: stable currently -will continue home antihypertensive regimen  5-GERD: continue PPI  6-HLD: will continue Zetia  7-GERD: continue PPI  Code Status: Full Family Communication: daughter at bedside  Disposition Plan: remains inpatient, will advance diet to Full liquid, increase activity, transition of steroids to PO, continue nebulizer treatment; continue antibiotics. If tolerated full liquid, abx's can transition to PO and complete 8 more days at discharge.    Consultants:  None   Procedures:  See below for x-ray reports   Antibiotics:  Cipro 12/06/15  Flagyl 12/06/15  HPI/Subjective: Afebrile, no CP, no vomiting. Feeling much better today. abd less tender and less distended. Passing gas and even small amount of stool. No nausea or vomiting and with improvement on her breathing.  Objective: Filed Vitals:   12/08/15 0501 12/08/15 1350  BP: 122/63 127/58  Pulse: 77 69  Temp: 98.2 F (36.8 C) 98 F (36.7 C)  Resp: 18 18    Intake/Output Summary (Last 24  hours) at 12/08/15 1805 Last data filed at 12/08/15 1500  Gross per 24 hour  Intake    720 ml  Output    700 ml  Net     20 ml   Filed Weights   12/07/15 0000  Weight: 71.1 kg (156 lb 12 oz)    Exam:   General:  Afebrile, breathing better and with less abd pain. Reported no further nausea and no vomiting.  Cardiovascular: S1 and S2, no rubs, no gallops  Respiratory: improve air movement, scattered rhonchi, mild exp wheezing. "Patient reported significant improvement in her breathing. No use of accessory muscles  Abdomen: soft, no guarding, less tender to palpation, less distension; positive BS  Musculoskeletal: no edema, no cyanosis   Data Reviewed: Basic Metabolic Panel:  Recent Labs Lab 12/06/15 0850 12/06/15 1816 12/07/15 0415 12/08/15 0527  NA 140 142 143 140  K 3.9 4.0 3.7 4.1  CL 104 104 105 106  CO2 29 28 30 30   GLUCOSE 122* 138* 115* 136*  BUN 16 14 14 14   CREATININE 1.46* 1.57* 1.39* 1.38*  CALCIUM 9.9 9.7 9.1 8.7*  MG  --   --  2.0  --   PHOS  --   --  4.1  --    Liver Function Tests:  Recent Labs Lab 12/06/15 0850 12/06/15 1816 12/07/15 0415  AST 31 26 26   ALT 23 21 18   ALKPHOS 33* 30* 26*  BILITOT 0.6 0.5 0.5  PROT 6.8 6.4* 5.3*  ALBUMIN 4.3 3.9 3.1*    Recent Labs Lab 12/06/15 0850 12/06/15 1816  LIPASE 47 41   CBC:  Recent Labs Lab 12/06/15 0850 12/06/15 1816 12/07/15 0415  12/08/15 0527  WBC 6.7 7.3 5.8 3.7*  NEUTROABS 4.9  --   --   --   HGB 14.0 13.6 12.8 12.2  HCT 43.8 43.0 41.0 37.6  MCV 92.0 92.5 93.8 93.5  PLT 275 262 248 221   BNP (last 3 results)  Recent Labs  12/09/14 1917  BNP 47.7   Studies: Dg Chest 2 View  12/07/2015  CLINICAL DATA:  Hypoxia.  Vomiting tonight. EXAM: CHEST  2 VIEW COMPARISON:  07/26/2015 FINDINGS: Linear bibasilar opacities, left greater than right. Lower lung volumes from prior exam. The cardiomediastinal contours are normal. Pulmonary vasculature is normal. No consolidation, pleural  effusion, or pneumothorax. No acute osseous abnormalities are seen. IMPRESSION: Linear bibasilar atelectasis. Electronically Signed   By: Jeb Levering M.D.   On: 12/07/2015 01:34   Dg Abd 2 Views  12/08/2015  CLINICAL DATA:  82 year old female with right abdominal pain. Nausea vomiting for 3 days. Initial encounter. EXAM: ABDOMEN - 2 VIEW COMPARISON:  CT Abdomen and Pelvis 12/06/2015 and earlier FINDINGS: Upright and supine views. No pneumoperitoneum. Stable widespread surgical clips about the central and right pelvis. Stable cholecystectomy clips. Oral contrast throughout normal colon. Paucity of small bowel gas today in nondilated loops. The stomach appears decompressed. Abdominal visceral contours are stable and within normal limits. Small bilateral pleural effusions now suspected at the lung bases. Osteopenia. Aortoiliac calcified atherosclerosis noted. IMPRESSION: 1.  Normal bowel gas pattern, no free air. 2. Small bilateral pleural effusions now suspected. Electronically Signed   By: Genevie Ann M.D.   On: 12/08/2015 07:58    Scheduled Meds: . aspirin EC  81 mg Oral QHS  . budesonide (PULMICORT) nebulizer solution  0.25 mg Nebulization BID  . ciprofloxacin  400 mg Intravenous Q12H  . enoxaparin (LOVENOX) injection  30 mg Subcutaneous Q24H  . ezetimibe  10 mg Oral Daily  . hydrALAZINE  50 mg Oral TID  . ipratropium  0.5 mg Nebulization Q6H  . isosorbide mononitrate  120 mg Oral Daily  . methylPREDNISolone (SOLU-MEDROL) injection  40 mg Intravenous Q12H  . metronidazole  500 mg Intravenous Q8H  . pantoprazole  40 mg Oral Daily  . polyethylene glycol  17 g Oral Daily   Continuous Infusions:   Active Problems:   COPD GOLD II    Essential hypertension   Ileitis   CKD (chronic kidney disease) stage 3, GFR 30-59 ml/min   Enteritis   Dehydration   CAD (coronary artery disease)   Hypoxia   Time spent: 35 minutes   Barton Dubois  [facility] Pager [phone]. If 7PM-7AM, please  contact night-coverage at [email or web address], password [facility] 12/08/2015, 6:05 PM  LOS: 2 days

## 2015-12-09 DIAGNOSIS — J449 Chronic obstructive pulmonary disease, unspecified: Secondary | ICD-10-CM

## 2015-12-09 DIAGNOSIS — R0902 Hypoxemia: Secondary | ICD-10-CM

## 2015-12-09 DIAGNOSIS — K529 Noninfective gastroenteritis and colitis, unspecified: Principal | ICD-10-CM

## 2015-12-09 MED ORDER — METRONIDAZOLE 500 MG PO TABS: 500.0000 mg | ORAL_TABLET | Freq: Three times a day (TID) | ORAL | Status: DC

## 2015-12-09 MED ORDER — CIPROFLOXACIN HCL 500 MG PO TABS: 500.0000 mg | ORAL_TABLET | Freq: Two times a day (BID) | ORAL | Status: DC

## 2015-12-09 MED ORDER — CIPROFLOXACIN HCL 500 MG PO TABS
500.0000 mg | ORAL_TABLET | Freq: Two times a day (BID) | ORAL | Status: DC
  Administered 2015-12-09: 500 mg via ORAL
  Filled 2015-12-09: qty 1

## 2015-12-09 NOTE — Care Management Important Message (Signed)
Important Message  Patient Details  Name: Gina Bond MRN: [id number] Date of Birth: [birth year]   Medicare Important Message Given:  Yes    [name] P Constantina Laseter 12/09/2015, 1:23 PM
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
## 2015-12-09 NOTE — Progress Notes (Signed)
AVS given to patient and daughter, understanding demonstrated. IV removed. Patient will leave around 4pm per MD.  Regular diet lunch ordered per MD.  Belongings packed. Transportation with daughter.

## 2016-02-01 ENCOUNTER — Encounter (HOSPITAL_BASED_OUTPATIENT_CLINIC_OR_DEPARTMENT_OTHER): Payer: Self-pay | Admitting: Emergency Medicine

## 2016-02-01 ENCOUNTER — Emergency Department (HOSPITAL_BASED_OUTPATIENT_CLINIC_OR_DEPARTMENT_OTHER): Payer: Medicare Other

## 2016-02-01 ENCOUNTER — Emergency Department (HOSPITAL_BASED_OUTPATIENT_CLINIC_OR_DEPARTMENT_OTHER)
Admission: EM | Admit: 2016-02-01 | Discharge: 2016-02-02 | Disposition: A | Discharge status: Home / Self Care | Payer: Medicare Other | Attending: Emergency Medicine | Admitting: Emergency Medicine

## 2016-02-01 DIAGNOSIS — S62629A Displaced fracture of medial phalanx of unspecified finger, initial encounter for closed fracture: Secondary | ICD-10-CM

## 2016-02-01 DIAGNOSIS — S62643A Nondisplaced fracture of proximal phalanx of left middle finger, initial encounter for closed fracture: Secondary | ICD-10-CM | POA: Insufficient documentation

## 2016-02-01 DIAGNOSIS — J449 Chronic obstructive pulmonary disease, unspecified: Secondary | ICD-10-CM | POA: Insufficient documentation

## 2016-02-01 DIAGNOSIS — Y92000 Kitchen of unspecified non-institutional (private) residence as  the place of occurrence of the external cause: Secondary | ICD-10-CM | POA: Insufficient documentation

## 2016-02-01 DIAGNOSIS — I10 Essential (primary) hypertension: Secondary | ICD-10-CM | POA: Insufficient documentation

## 2016-02-01 DIAGNOSIS — S00531A Contusion of lip, initial encounter: Secondary | ICD-10-CM | POA: Diagnosis not present

## 2016-02-01 DIAGNOSIS — W19XXXA Unspecified fall, initial encounter: Secondary | ICD-10-CM

## 2016-02-01 DIAGNOSIS — Z7982 Long term (current) use of aspirin: Secondary | ICD-10-CM | POA: Insufficient documentation

## 2016-02-01 DIAGNOSIS — Y9389 Activity, other specified: Secondary | ICD-10-CM | POA: Diagnosis not present

## 2016-02-01 DIAGNOSIS — Z792 Long term (current) use of antibiotics: Secondary | ICD-10-CM | POA: Insufficient documentation

## 2016-02-01 DIAGNOSIS — Z87448 Personal history of other diseases of urinary system: Secondary | ICD-10-CM | POA: Insufficient documentation

## 2016-02-01 DIAGNOSIS — W010XXA Fall on same level from slipping, tripping and stumbling without subsequent striking against object, initial encounter: Secondary | ICD-10-CM | POA: Insufficient documentation

## 2016-02-01 DIAGNOSIS — Y998 Other external cause status: Secondary | ICD-10-CM | POA: Insufficient documentation

## 2016-02-01 DIAGNOSIS — Z8701 Personal history of pneumonia (recurrent): Secondary | ICD-10-CM | POA: Insufficient documentation

## 2016-02-01 DIAGNOSIS — Z87891 Personal history of nicotine dependence: Secondary | ICD-10-CM | POA: Diagnosis not present

## 2016-02-01 DIAGNOSIS — E785 Hyperlipidemia, unspecified: Secondary | ICD-10-CM | POA: Diagnosis not present

## 2016-02-01 DIAGNOSIS — T07XXXA Unspecified multiple injuries, initial encounter: Secondary | ICD-10-CM

## 2016-02-01 DIAGNOSIS — S0083XA Contusion of other part of head, initial encounter: Secondary | ICD-10-CM | POA: Insufficient documentation

## 2016-02-01 DIAGNOSIS — S6992XA Unspecified injury of left wrist, hand and finger(s), initial encounter: Secondary | ICD-10-CM | POA: Diagnosis present

## 2016-02-01 DIAGNOSIS — Z79899 Other long term (current) drug therapy: Secondary | ICD-10-CM | POA: Diagnosis not present

## 2016-02-01 NOTE — ED Notes (Signed)
Pt tripped and fell on tile floor. Pt complaining of left hand pain, facial pain and chest/abd pain. Denies LOC

## 2016-02-01 NOTE — ED Provider Notes (Addendum)
CSN: [id number]     Arrival date & time 02/01/16  2227 History  By signing my name below, I, Altamease Oiler, attest that this documentation has been prepared under the direction and in the presence of Shanon Rosser, MD. Electronically Signed: Altamease Oiler, ED Scribe. 02/01/2016. 11:25 PM    Chief Complaint  Patient presents with  . Fall   The history is provided by the patient. No language interpreter was used.   Gina Bond is a 82 y.o. female who presents to the Emergency Department complaining of a fall approximately 2 hours ago in the kitchen of her home. Pt states that she tripped and fell to the tile floor. Associated symptoms include left lower lip pain and swelling, anterior chest pain worse with breathing, and left hand pain worse with movement or palpation. ROM of left middle finger in decreased due to pain and swelling. Pain is mild to moderate. Pt denies LOC and other injury.   Past Medical History  Diagnosis Date  . Pneumonia, organism unspecified   . COPD (chronic obstructive pulmonary disease) ([place])   . Hypertension   . Renal disorder   . Hyperlipidemia   . Renal insufficiency    Past Surgical History  Procedure Laterality Date  . Cholecystectomy    . Nerve ablasion    . Appendectomy    . Retinal detachment surgery    . Tonsillectomy     Family History  Problem Relation Age of Onset  . Emphysema Mother     smoked  . Emphysema Sister     smoked  . Heart disease Father   . Lung cancer Brother     smoked a pipe- with mets to bone  . Stroke Mother    Social History  Substance Use Topics  . Smoking status: Former Smoker -- 2.00 packs/day for 57 years    Types: Cigarettes    Quit date: 02/02/2010  . Smokeless tobacco: None  . Alcohol Use: No   OB History    No data available     Review of Systems  10 Systems reviewed and all are negative for acute change except as noted in the HPI.  Allergies  Benicar; Fenofibrate; Sulfa antibiotics;  Sulfacetamide sodium; Verapamil; Atorvastatin; Magnesium; Rosuvastatin calcium; Statins; Other; Sulfamethoxazole; and Tobramycin  Home Medications   Prior to Admission medications   Medication Sig Start Date End Date Taking? Authorizing Provider  acetaminophen (TYLENOL) 500 MG tablet Take 1 tablet (500 mg total) by mouth every 6 (six) hours as needed. Patient taking differently: Take 500 mg by mouth daily as needed for mild pain.  06/13/14   Ernestina Patches, MD  aspirin 81 MG tablet Take 81 mg by mouth at bedtime.     Historical Provider, MD  Cholecalciferol (VITAMIN D3) 5000 UNITS TABS Take 1 tablet by mouth daily.    Historical Provider, MD  ciprofloxacin (CIPRO) 500 MG tablet Take 1 tablet (500 mg total) by mouth 2 (two) times daily. 12/09/15   Kelvin Cellar, MD  docusate sodium (COLACE) 100 MG capsule Take 1 capsule (100 mg total) by mouth 2 (two) times daily. 07/29/15   Eugenie Filler, MD  ezetimibe (ZETIA) 10 MG tablet Take 10 mg by mouth daily.    Historical Provider, MD  fenofibrate micronized (LOFIBRA) 134 MG capsule Take 134 mg by mouth daily.     Historical Provider, MD  fluvastatin (LESCOL) 40 MG capsule Take 40 mg by mouth at bedtime.  07/15/15   Historical Provider, MD  hydrALAZINE (  APRESOLINE) 25 MG tablet Take 50 mg by mouth 3 (three) times daily.     Historical Provider, MD  HYDROcodone-acetaminophen (NORCO) 5-325 MG tablet Take 1-2 tablets by mouth every 4 (four) hours as needed for severe pain. 12/06/15   Sherwood Gambler, MD  isosorbide mononitrate (IMDUR) 120 MG 24 hr tablet Take 120 mg by mouth daily.  09/19/13   Historical Provider, MD  levalbuterol Penne Lash HFA) 45 MCG/ACT inhaler Inhale 2 puffs into the lungs every 4 (four) hours as needed for wheezing or shortness of breath. Patient not taking: Reported on 12/06/2015 12/10/14   Delfina Redwood, MD  metroNIDAZOLE (FLAGYL) 500 MG tablet Take 1 tablet (500 mg total) by mouth 3 (three) times daily. 12/09/15   Kelvin Cellar, MD   Multiple Vitamins-Minerals (CENTRUM SILVER PO) Take 1 tablet by mouth daily.    Historical Provider, MD  nebivolol (BYSTOLIC) 5 MG tablet Take 5 mg by mouth daily as needed (if heart rate >150).    Historical Provider, MD  olmesartan-hydrochlorothiazide (BENICAR HCT) 20-12.5 MG tablet Take 0.5 tablets by mouth daily. 07/09/13   Historical Provider, MD  omeprazole (PRILOSEC) 40 MG capsule Take 40 mg by mouth daily. 01/02/15   Historical Provider, MD  ondansetron (ZOFRAN ODT) 4 MG disintegrating tablet 4mg  ODT q4 hours prn nausea/vomit Patient taking differently: Take 4 mg by mouth every 4 (four) hours as needed for nausea or vomiting. 4mg  ODT q4 hours prn nausea/vomit 12/06/15   Sherwood Gambler, MD  polyethylene glycol (MIRALAX / GLYCOLAX) packet Take 17 g by mouth daily. 07/29/15   Eugenie Filler, MD  verapamil (VERELAN PM) 240 MG 24 hr capsule Take 240 mg by mouth 2 (two) times daily.     Historical Provider, MD   BP 181/86 mmHg  Pulse 75  Temp(Src) 97.5 F (36.4 C) (Oral)  Resp 18  Wt 155 lb (70.308 kg)  SpO2 98% Physical Exam  Nursing note and vitals reviewed. General: Well-developed, well-nourished female in no acute distress; appearance consistent with age of record HENT: normocephalic; hematoma to lower lip and superficial contusion of the left chin Eyes: pupils equal, round and reactive to light; extraocular muscles intact Neck: supple; no C-spine tenderness but pain on rotation to the right Heart: regular rate and rhythm Lungs: clear to auscultation bilaterally Chest: Nontender Abdomen: soft; nondistended; nontender; no masses or hepatosplenomegaly; bowel sounds present Extremities: No deformity; full range of motion; pulses normal; trace edema of lower legs; abrasion and ecchymosis overlying left 3rd MCP joint, tenderness of left 3rd MCP joint, tenderness of left 3rd PIP joint, limited ROM of the middle finger of the left hand, left middle finger NVI with intact tendon function;   arthritic changes noted Neurologic: Awake, alert and oriented; motor function intact in all extremities and symmetric; no facial droop Skin: Warm and dry Psychiatric: Normal mood and affect  ED Course  Procedures (including critical care time) DIAGNOSTIC STUDIES: Oxygen Saturation is 98% on RA,  normal by my interpretation.    COORDINATION OF CARE: 11:19 PM Discussed treatment plan which includes left hand XR and cervical spine XR with pt at bedside and pt agreed to plan.   MDM  Nursing notes and vitals signs, including pulse oximetry, reviewed.  Summary of this visit's results, reviewed by myself:  Imaging Studies: Dg Cervical Spine Complete  02/01/2016  CLINICAL DATA:  Tripped on rug and injured neck, with posterior right neck pain. Initial encounter. EXAM: CERVICAL SPINE - COMPLETE 4+ VIEW COMPARISON:  CT of the  cervical spine performed 06/13/2014 FINDINGS: There is no evidence of fracture or subluxation. Vertebral bodies demonstrate normal height and alignment. Mild multilevel disc space narrowing is noted along the cervical spine. Prevertebral soft tissues are within normal limits. The provided odontoid view demonstrates no significant abnormality. The visualized lung apices are clear. IMPRESSION: No evidence of fracture or subluxation along the cervical spine. Electronically Signed   By: Garald Balding M.D.   On: 02/01/2016 23:54   Dg Hand Complete Left  02/01/2016  CLINICAL DATA:  Pt states she tripped on a rug tonight and injured her left hand. C/o pain in middle finger. States she is unable to bend middle finger. Proximal laceration to middle finger. EXAM: LEFT HAND - COMPLETE 3+ VIEW COMPARISON:  None. FINDINGS: On the lateral view only, there is a small volar plate avulsion fracture from the base of the middle phalanx of the middle finger, without significant displacement. There is no other evidence of a fracture. Bones are demineralized. There are osteoarthritic changes involving  the interphalangeal joints. Soft tissue swelling is seen surrounding the PIP joint of the middle finger. IMPRESSION: 1. Small, nondisplaced, avulsion fracture from the volar/palmar margin of the base of the middle phalanx of the middle finger. 2. No other fracture or acute finding. Electronically Signed   By: Lajean Manes M.D.   On: 02/01/2016 23:05    I personally performed the services described in this documentation, which was scribed in my presence. The recorded information has been reviewed and is accurate.   Shanon Rosser, MD 02/02/16 [id number]  Shanon Rosser, MD 02/02/16 [id number]

## 2016-05-26 DIAGNOSIS — M25551 Pain in right hip: Secondary | ICD-10-CM | POA: Insufficient documentation

## 2016-06-21 ENCOUNTER — Encounter: Payer: Self-pay | Admitting: Physical Therapy

## 2016-06-21 ENCOUNTER — Ambulatory Visit: Payer: Medicare Other | Attending: Orthopedic Surgery | Admitting: Physical Therapy

## 2016-06-21 DIAGNOSIS — M25551 Pain in right hip: Secondary | ICD-10-CM | POA: Diagnosis not present

## 2016-06-21 DIAGNOSIS — R262 Difficulty in walking, not elsewhere classified: Secondary | ICD-10-CM | POA: Diagnosis present

## 2016-06-21 DIAGNOSIS — R252 Cramp and spasm: Secondary | ICD-10-CM | POA: Insufficient documentation

## 2016-06-21 DIAGNOSIS — M62831 Muscle spasm of calf: Secondary | ICD-10-CM

## 2016-06-21 NOTE — Therapy (Signed)
[address] [place], [place], [address] Phone: [phone]   Fax:  [phone]  Physical Therapy Evaluation  Patient Details  Name: Gina Bond MRN: [id number] Date of Birth: 08-Mar-1932 Referring Provider: Paralee Cancel  Encounter Date: 06/21/2016      PT End of Session - 06/21/16 1638    Visit Number 1   Date for PT Re-Evaluation 08/22/16   PT Start Time 1611   PT Stop Time 1705   PT Time Calculation (min) 54 min   Activity Tolerance Patient tolerated treatment well   Behavior During Therapy [facility] for tasks assessed/performed      Past Medical History  Diagnosis Date  . Pneumonia, organism unspecified   . COPD (chronic obstructive pulmonary disease) ([place])   . Hypertension   . Renal disorder   . Hyperlipidemia   . Renal insufficiency     Past Surgical History  Procedure Laterality Date  . Cholecystectomy    . Nerve ablasion    . Appendectomy    . Retinal detachment surgery    . Tonsillectomy      There were no vitals filed for this visit.       Subjective Assessment - 06/21/16 1611    Subjective Patient reports that she has been having some right hip pain and lower extremity pain bilaterally over the past several months.  She reports a cortisone injection a few weeks ago with some relief.  She reports difficulty walking and getting up from sitting.  She reports "[name]" in the LE's nightly.  She reports this occurs every night and with significant pain   Limitations Standing;Walking;House hold activities   Patient Stated Goals have less pain   Currently in Pain? Yes   Pain Score 4    Pain Location Hip   Pain Orientation Left;Right   Pain Descriptors / Indicators Aching;Tightness   Pain Type Chronic pain   Pain Onset More than a month ago   Pain Frequency Constant   Aggravating Factors  reports pain worse in the legs at night with charlie horses pain up to9/10   Pain  Relieving Factors when the cramps come she gets up to relieve the pain pain will subside at best a 2/10   Effect of Pain on Daily Activities limits ability to sleep, walk            [facility] PT Assessment - 06/21/16 0001    Assessment   Medical Diagnosis hip pain   Referring Provider Paralee Cancel   Onset Date/Surgical Date 05/22/16   Prior Therapy no   Precautions   Precautions None   Balance Screen   Has the patient fallen in the past 6 months Yes   How many times? 1   Has the patient had a decrease in activity level because of a fear of falling?  No   Is the patient reluctant to leave their home because of a fear of falling?  No   Home Environment   Additional Comments no stairs, does housework and has a small garden   Prior Function   Level of Independence Independent   Vocation Retired   Leisure does not exercise   Posture/Postural Control   Posture Comments decreased lordosis, slouched sitting posture   ROM / Strength   AROM / PROM / Strength AROM;Strength   AROM   Overall AROM Comments AROM of the lumbar spine decreased 50%, hips WFL's but the LE's are very tight  Strength   Overall Strength Comments 4-/5 for the LE's   Flexibility   Soft Tissue Assessment /Muscle Length --  very tight calves, HS, piriformis and ITB   Palpation   Palpation comment she is very tight and sore in the lumbar area, the buttocks the ITB  calves and HS   Ambulation/Gait   Gait Comments no device, slow, slightly antalgic on the right, reports 150 feet is her max distance before needing to stop and rest                   [facility] Adult PT Treatment/Exercise - 06/21/16 0001    Modalities   Modalities Electrical Stimulation;Moist Heat   Moist Heat Therapy   Number Minutes Moist Heat 15 Minutes   Moist Heat Location Lumbar Spine   Electrical Stimulation   Electrical Stimulation Location lumbar area   Electrical Stimulation Action IFC   Electrical Stimulation Parameters supine    Electrical Stimulation Goals Pain                  PT Short Term Goals - 06/21/16 1646    PT SHORT TERM GOAL #1   Title independent with intial HEP   Time 2   Period Weeks   Status New           PT Long Term Goals - 06/21/16 1649    PT LONG TERM GOAL #1   Title decrease pain 50%   Time 8   Period Weeks   Status New   PT LONG TERM GOAL #2   Title increase lumbar ROM 25%   Time 8   Period Weeks   Status New   PT LONG TERM GOAL #3   Title reports charlie horses at night decreased 50%   Time 8   Period Weeks   Status New   PT LONG TERM GOAL #4   Title vacuum house without difficulty   Time 8   Period Weeks   Status New               Plan - 06/21/16 1639    Clinical Impression Statement Patient with hip and LE pain over the past 3-4 months, she is not sure of a specific cause.  She reports that she has charlie horses at night in the LE's from HS, calves, quads and feet, she had an injection in the hip that helped the hip pain some.  She reports an increased difficulty in walking and getting up from sitting, she reports that she has been unable to use the vaccuum due to increase of pain iwth that.  The biggest issue seems to be mm tightness in the LE's   Rehab Potential Good   PT Frequency 2x / week   PT Duration 8 weeks   PT Treatment/Interventions Moist Heat;Iontophoresis 4mg /ml Dexamethasone;Electrical Stimulation;Cryotherapy;ADLs/Self Care Home Management;Ultrasound;Gait training;Stair training;Functional mobility training;Therapeutic activities;Therapeutic exercise;Manual techniques;Patient/family education   PT Next Visit Plan issue initial HEP with flexibility exercises, start flexibility and exercises in our gym   Consulted and Agree with Plan of Care Patient      Patient will benefit from skilled therapeutic intervention in order to improve the following deficits and impairments:  Abnormal gait, Cardiopulmonary status limiting activity, Decreased  activity tolerance, Decreased mobility, Decreased range of motion, Difficulty walking, Decreased strength, Impaired flexibility, Pain, Increased muscle spasms  Visit Diagnosis: Pain in right hip - Plan: PT plan of care cert/re-cert  Difficulty in walking, not elsewhere classified - Plan: PT plan  of care cert/re-cert  Cramp and spasm - Plan: PT plan of care cert/re-cert  Muscle spasm of calf - Plan: PT plan of care cert/re-cert      G-Codes - [id number]    Functional Assessment Tool Used foto 71% limitation   Functional Limitation Mobility: Walking and moving around   Mobility: Walking and Moving Around Current Status [phone]) At least 60 percent but less than 80 percent impaired, limited or restricted   Mobility: Walking and Moving Around Goal Status [phone]) At least 40 percent but less than 60 percent impaired, limited or restricted       Problem List Patient Active Problem List   Diagnosis Date Noted  . Hypoxia   . Enteritis 12/06/2015  . Dehydration 12/06/2015  . CAD (coronary artery disease) 12/06/2015  . SBO (small bowel obstruction) ([place])   . Renal failure (ARF), acute on chronic (HCC)   . Back pain   . Dyspnea   . Small bowel obstruction ([place])   . Partial small bowel obstruction ([place]) 07/24/2015  . Ileitis 07/24/2015  . CKD (chronic kidney disease) stage 3, GFR 30-59 ml/min 07/24/2015  . Essential hypertension 12/10/2014  . COPD exacerbation ([place]) 12/10/2014  . Unstable angina ([place]) 12/09/2014  . COPD GOLD II  09/13/2013    Sumner Boast., PT 06/21/2016, 5:36 PM  [address] [place], [place], [address] Phone: [phone]   Fax:  [phone]  Name: Gina Bond MRN: [id number] Date of Birth: [birth year]

## 2016-06-23 ENCOUNTER — Ambulatory Visit: Payer: Medicare Other | Admitting: Physical Therapy

## 2016-06-23 ENCOUNTER — Encounter: Payer: Medicare Other | Admitting: Physical Therapy

## 2016-06-27 ENCOUNTER — Ambulatory Visit: Payer: Medicare Other | Admitting: Physical Therapy

## 2016-06-27 DIAGNOSIS — M25551 Pain in right hip: Secondary | ICD-10-CM | POA: Diagnosis not present

## 2016-06-27 DIAGNOSIS — R262 Difficulty in walking, not elsewhere classified: Secondary | ICD-10-CM

## 2016-06-27 NOTE — Therapy (Signed)
[place] [address] [place], [place], [address] Phone: [phone]   Fax:  [phone]  Physical Therapy Treatment  Patient Details  Name: Gina Bond MRN: [id number] Date of Birth: Aug 31, 1932 Referring Provider: Paralee Cancel  Encounter Date: 06/27/2016      PT End of Session - 06/27/16 1425    Visit Number 2   Date for PT Re-Evaluation 08/22/16   PT Start Time 1340   PT Stop Time 1436   PT Time Calculation (min) 56 min   Activity Tolerance Patient tolerated treatment well   Behavior During Therapy [facility] for tasks assessed/performed      Past Medical History:  Diagnosis Date  . COPD (chronic obstructive pulmonary disease) ([place])   . Hyperlipidemia   . Hypertension   . Pneumonia, organism unspecified   . Renal disorder   . Renal insufficiency     Past Surgical History:  Procedure Laterality Date  . APPENDECTOMY    . CHOLECYSTECTOMY    . NERVE ABLASION    . RETINAL DETACHMENT SURGERY    . TONSILLECTOMY      There were no vitals filed for this visit.      Subjective Assessment - 06/27/16 1340    Subjective Pt reported that she is having some pain in her L knee, said it was popping and clicking but tolerable.    Limitations Standing;Walking;House hold activities   Currently in Pain? Yes   Pain Score 8    Pain Location Knee   Pain Orientation Left   Aggravating Factors  Reports L knee pain increases upon moving from sit to stand.                          [facility] Adult PT Treatment/Exercise - 06/27/16 0001      Lumbar Exercises: Standing   Scapular Retraction 20 reps;Strengthening   Theraband Level (Scapular Retraction) Level 1 (Yellow)   Other Standing Lumbar Exercises Wall push offs from green physioball x10     Lumbar Exercises: Seated   Other Seated Lumbar Exercises Seated on blue airex disc, pelvic clock exercise 2x10   Other Seated Lumbar Exercises Sit to stand with  weighted yellow ball x5, trunk rotations with weighted yellow ball, 2x10     Lumbar Exercises: Supine   Bridge 10 reps   Bridge Limitations Limit supine activities,   Other Supine Lumbar Exercises HS curls and trunk rotations with feet on red physioball 2x10 ea     Knee/Hip Exercises: Aerobic   Nustep L4 x 66min     Knee/Hip Exercises: Standing   Wall Squat 10 reps   Wall Squat Limitations Green Physioball     Moist Heat Therapy   Number Minutes Moist Heat 15 Minutes   Moist Heat Location Lumbar Spine     Electrical Stimulation   Electrical Stimulation Location lumbar/R hip area   Electrical Stimulation Action IFC   Electrical Stimulation Parameters supine   Electrical Stimulation Goals Pain                  PT Short Term Goals - 06/21/16 1646      PT SHORT TERM GOAL #1   Title independent with intial HEP   Time 2   Period Weeks   Status New           PT Long Term Goals - 06/21/16 1649      PT LONG TERM GOAL #1  Title decrease pain 50%   Time 8   Period Weeks   Status New     PT LONG TERM GOAL #2   Title increase lumbar ROM 25%   Time 8   Period Weeks   Status New     PT LONG TERM GOAL #3   Title reports charlie horses at night decreased 50%   Time 8   Period Weeks   Status New     PT LONG TERM GOAL #4   Title vacuum house without difficulty   Time 8   Period Weeks   Status New               Plan - 06/27/16 1426    Clinical Impression Statement Pt expressed that she was feeling a stretch to her lumbar area during seated and supine lumbar exercises. Pt felt a stretch in her calves when doing wall push offs from green physioball. Pt expressed that heat and Estim helped last time, encouraged pt to stretch when she can at home.    Rehab Potential Good   PT Frequency 2x / week   PT Duration 8 weeks   PT Treatment/Interventions Moist Heat;Iontophoresis 4mg /ml Dexamethasone;Electrical Stimulation;Cryotherapy;ADLs/Self Care Home  Management;Ultrasound;Gait training;Stair training;Functional mobility training;Therapeutic activities;Therapeutic exercise;Manual techniques;Patient/family education   PT Next Visit Plan Continue to implement trunk rotation exercises as well and LE stretches. Progress aerobic ther ex as tolerated.       Patient will benefit from skilled therapeutic intervention in order to improve the following deficits and impairments:  Abnormal gait, Cardiopulmonary status limiting activity, Decreased activity tolerance, Decreased mobility, Decreased range of motion, Difficulty walking, Decreased strength, Impaired flexibility, Pain, Increased muscle spasms  Visit Diagnosis: Pain in right hip  Difficulty in walking, not elsewhere classified     Problem List Patient Active Problem List   Diagnosis Date Noted  . Hypoxia   . Enteritis 12/06/2015  . Dehydration 12/06/2015  . CAD (coronary artery disease) 12/06/2015  . SBO (small bowel obstruction) ([place])   . Renal failure (ARF), acute on chronic (HCC)   . Back pain   . Dyspnea   . Small bowel obstruction ([place])   . Partial small bowel obstruction ([place]) 07/24/2015  . Ileitis 07/24/2015  . CKD (chronic kidney disease) stage 3, GFR 30-59 ml/min 07/24/2015  . Essential hypertension 12/10/2014  . COPD exacerbation ([place]) 12/10/2014  . Unstable angina ([place]) 12/09/2014  . COPD GOLD II  09/13/2013    Gina Bond, [place] 06/27/2016, 2:50 PM  [address] [place], [place], [address] Phone: [phone]   Fax:  [phone]  Name: Gina Bond MRN: [id number] Date of Birth: 06-Jun-1932

## 2016-06-30 ENCOUNTER — Ambulatory Visit: Payer: Medicare Other | Admitting: Physical Therapy

## 2016-06-30 DIAGNOSIS — M25551 Pain in right hip: Secondary | ICD-10-CM

## 2016-06-30 DIAGNOSIS — R262 Difficulty in walking, not elsewhere classified: Secondary | ICD-10-CM

## 2016-06-30 NOTE — Therapy (Signed)
[address] [place], [place], [address] Phone: [phone]   Fax:  [phone]  Physical Therapy Treatment  Patient Details  Name: Gina Bond MRN: [id number] Date of Birth: January 08, 1932 Referring Provider: Paralee Cancel  Encounter Date: 06/30/2016      PT End of Session - 06/30/16 1431    Visit Number 3   Date for PT Re-Evaluation 08/22/16   PT Start Time 1342   PT Stop Time 1442   PT Time Calculation (min) 60 min   Activity Tolerance Patient tolerated treatment well   Behavior During Therapy [facility] for tasks assessed/performed      Past Medical History:  Diagnosis Date  . COPD (chronic obstructive pulmonary disease) ([place])   . Hyperlipidemia   . Hypertension   . Pneumonia, organism unspecified   . Renal disorder   . Renal insufficiency     Past Surgical History:  Procedure Laterality Date  . APPENDECTOMY    . CHOLECYSTECTOMY    . NERVE ABLASION    . RETINAL DETACHMENT SURGERY    . TONSILLECTOMY      There were no vitals filed for this visit.      Subjective Assessment - 06/30/16 1344    Subjective Pt reports that her L knee was bothering her this morning but feels better right now. Pt reports having an X-Ray for her L knee.   Limitations Standing;Walking;House hold activities   Patient Stated Goals have less pain   Currently in Pain? Yes   Pain Score 5    Pain Location Knee   Pain Orientation Left                         [facility] Adult PT Treatment/Exercise - 06/30/16 0001      Lumbar Exercises: Stretches   Passive Hamstring Stretch 3 reps;10 seconds   Double Knee to Chest Stretch 3 reps;10 seconds   Lower Trunk Rotation 4 reps   Piriformis Stretch 3 reps;10 seconds     Lumbar Exercises: Seated   Other Seated Lumbar Exercises Trunk rotations with yelllow weighted ball x15     Knee/Hip Exercises: Aerobic   Nustep L4 x 2min     Knee/Hip Exercises: Seated   Long  Arc Quad 2 sets;10 reps;Strengthening;Both   Long Arc Quad Weight 3 lbs.   Ball Squeeze 2x10   Marching Strengthening;20 reps;Weights   [employer] 3 lbs.   Abduction/Adduction  Strengthening;Both;2 sets;10 reps   Abd/Adduction Limitations red tband     Modalities   Modalities Electrical Stimulation     Moist Heat Therapy   Number Minutes Moist Heat 15 Minutes   Moist Heat Location Lumbar Spine     Electrical Stimulation   Electrical Stimulation Location lumbar/R hip area   Electrical Stimulation Action IFC   Electrical Stimulation Parameters supine   Electrical Stimulation Goals Pain                  PT Short Term Goals - 06/21/16 1646      PT SHORT TERM GOAL #1   Title independent with intial HEP   Time 2   Period Weeks   Status New           PT Long Term Goals - 06/21/16 1649      PT LONG TERM GOAL #1   Title decrease pain 50%   Time 8   Period Weeks   Status New  PT LONG TERM GOAL #2   Title increase lumbar ROM 25%   Time 8   Period Weeks   Status New     PT LONG TERM GOAL #3   Title reports charlie horses at night decreased 50%   Time 8   Period Weeks   Status New     PT LONG TERM GOAL #4   Title vacuum house without difficulty   Time 8   Period Weeks   Status New               Plan - 06/30/16 1437    Clinical Impression Statement Pt had very tight HS, Piriformis and glutes during passive stretches. Pt expressed that she was having increased pulling sensation in her L knee during LAQ's. Pt needed cueing for proper form for LAQ's.    Rehab Potential Good   PT Frequency 2x / week   PT Duration 8 weeks   PT Treatment/Interventions Moist Heat;Iontophoresis 4mg /ml Dexamethasone;Electrical Stimulation;Cryotherapy;ADLs/Self Care Home Management;Ultrasound;Gait training;Stair training;Functional mobility training;Therapeutic activities;Therapeutic exercise;Manual techniques;Patient/family education   PT Next Visit Plan  Continue to work on flexibility and progress strengthening exercises as tolerated.       Patient will benefit from skilled therapeutic intervention in order to improve the following deficits and impairments:  Abnormal gait, Cardiopulmonary status limiting activity, Decreased activity tolerance, Decreased mobility, Decreased range of motion, Difficulty walking, Decreased strength, Impaired flexibility, Pain, Increased muscle spasms  Visit Diagnosis: No diagnosis found.     Problem List Patient Active Problem List   Diagnosis Date Noted  . Hypoxia   . Enteritis 12/06/2015  . Dehydration 12/06/2015  . CAD (coronary artery disease) 12/06/2015  . SBO (small bowel obstruction) ([place])   . Renal failure (ARF), acute on chronic (HCC)   . Back pain   . Dyspnea   . Small bowel obstruction ([place])   . Partial small bowel obstruction ([place]) 07/24/2015  . Ileitis 07/24/2015  . CKD (chronic kidney disease) stage 3, GFR 30-59 ml/min 07/24/2015  . Essential hypertension 12/10/2014  . COPD exacerbation ([place]) 12/10/2014  . Unstable angina ([place]) 12/09/2014  . COPD GOLD II  09/13/2013    Dallie Piles, SPTA 06/30/2016, 2:42 PM  [place] [address] [place], [place], [address] Phone: [phone]   Fax:  [phone]  Name: Gina Bond MRN: [id number] Date of Birth: [birth year]

## 2016-06-30 NOTE — Therapy (Addendum)
[address] [place], [place], [address] Phone: [phone]   Fax:  [phone]  Physical Therapy Treatment  Patient Details  Name: Gina Bond MRN: [id number] Date of Birth: [birth year] Referring Provider: Paralee Cancel  Encounter Date: 06/30/2016      PT End of Session - 06/30/16 1431    Visit Number 3   Date for PT Re-Evaluation 08/22/16   PT Start Time 1342   PT Stop Time 1442   PT Time Calculation (min) 60 min   Activity Tolerance Patient tolerated treatment well   Behavior During Therapy [facility] for tasks assessed/performed      Past Medical History:  Diagnosis Date  . COPD (chronic obstructive pulmonary disease) ([place])   . Hyperlipidemia   . Hypertension   . Pneumonia, organism unspecified   . Renal disorder   . Renal insufficiency     Past Surgical History:  Procedure Laterality Date  . APPENDECTOMY    . CHOLECYSTECTOMY    . NERVE ABLASION    . RETINAL DETACHMENT SURGERY    . TONSILLECTOMY      There were no vitals filed for this visit.      Subjective Assessment - 06/30/16 1344    Subjective Pt reports that her L knee was bothering her this morning but feels better right now. Pt reports having an X-Ray for her L knee.   Limitations Standing;Walking;House hold activities   Patient Stated Goals have less pain   Currently in Pain? Yes   Pain Score 5    Pain Location Knee   Pain Orientation Left                         [facility] Adult PT Treatment/Exercise - 06/30/16 0001      Lumbar Exercises: Stretches   Passive Hamstring Stretch 3 reps;10 seconds   Double Knee to Chest Stretch 3 reps;10 seconds   Lower Trunk Rotation 4 reps   Piriformis Stretch 3 reps;10 seconds     Lumbar Exercises: Seated   Other Seated Lumbar Exercises Trunk rotations with yelllow weighted ball x15     Knee/Hip Exercises: Aerobic   Nustep L4 x 59min     Knee/Hip Exercises: Seated   Long  Arc Quad 2 sets;10 reps;Strengthening;Both   Long Arc Quad Weight 3 lbs.   Ball Squeeze 2x10   Marching Strengthening;20 reps;Weights   [employer] 3 lbs.   Abduction/Adduction  Strengthening;Both;2 sets;10 reps   Abd/Adduction Limitations red tband     Modalities   Modalities Electrical Stimulation     Moist Heat Therapy   Number Minutes Moist Heat 15 Minutes   Moist Heat Location Lumbar Spine     Electrical Stimulation   Electrical Stimulation Location lumbar/R hip area   Electrical Stimulation Action IFC   Electrical Stimulation Parameters supine   Electrical Stimulation Goals Pain                  PT Short Term Goals - 06/21/16 1646      PT SHORT TERM GOAL #1   Title independent with intial HEP   Time 2   Period Weeks   Status New           PT Long Term Goals - 06/21/16 1649      PT LONG TERM GOAL #1   Title decrease pain 50%   Time 8   Period Weeks   Status New  PT LONG TERM GOAL #2   Title increase lumbar ROM 25%   Time 8   Period Weeks   Status New     PT LONG TERM GOAL #3   Title reports charlie horses at night decreased 50%   Time 8   Period Weeks   Status New     PT LONG TERM GOAL #4   Title vacuum house without difficulty   Time 8   Period Weeks   Status New               Plan - 06/30/16 1437    Clinical Impression Statement Pt had very tight HS, Piriformis and glutes during passive stretches. Pt expressed that she was having increased pulling sensation in her L knee during LAQ's. Pt needed cueing for proper form for LAQ's.    Rehab Potential Good   PT Frequency 2x / week   PT Duration 8 weeks   PT Treatment/Interventions Moist Heat;Iontophoresis 4mg /ml Dexamethasone;Electrical Stimulation;Cryotherapy;ADLs/Self Care Home Management;Ultrasound;Gait training;Stair training;Functional mobility training;Therapeutic activities;Therapeutic exercise;Manual techniques;Patient/family education   PT Next Visit Plan  Continue to work on flexibility and progress strengthening exercises as tolerated.       Patient will benefit from skilled therapeutic intervention in order to improve the following deficits and impairments:  Abnormal gait, Cardiopulmonary status limiting activity, Decreased activity tolerance, Decreased mobility, Decreased range of motion, Difficulty walking, Decreased strength, Impaired flexibility, Pain, Increased muscle spasms  Visit Diagnosis: Pain in right hip  Difficulty in walking, not elsewhere classified     Problem List Patient Active Problem List   Diagnosis Date Noted  . Hypoxia   . Enteritis 12/06/2015  . Dehydration 12/06/2015  . CAD (coronary artery disease) 12/06/2015  . SBO (small bowel obstruction) ([place])   . Renal failure (ARF), acute on chronic (HCC)   . Back pain   . Dyspnea   . Small bowel obstruction ([place])   . Partial small bowel obstruction ([place]) 07/24/2015  . Ileitis 07/24/2015  . CKD (chronic kidney disease) stage 3, GFR 30-59 ml/min 07/24/2015  . Essential hypertension 12/10/2014  . COPD exacerbation ([place]) 12/10/2014  . Unstable angina ([place]) 12/09/2014  . COPD GOLD II  09/13/2013    Scot Jun, PTA 06/30/2016, 4:44 PM  [address] [place], [place], [address] Phone: [phone]   Fax:  [phone]  Name: Gina Bond MRN: [id number] Date of Birth: 06/08/1932   During this treatment session, the therapist was present, participating in and directing the treatment.  Cheri Fowler, PTA

## 2016-07-05 ENCOUNTER — Encounter: Payer: Self-pay | Admitting: Physical Therapy

## 2016-07-05 ENCOUNTER — Ambulatory Visit: Payer: Medicare Other | Attending: Orthopedic Surgery | Admitting: Physical Therapy

## 2016-07-05 DIAGNOSIS — M25551 Pain in right hip: Secondary | ICD-10-CM | POA: Diagnosis not present

## 2016-07-05 DIAGNOSIS — R252 Cramp and spasm: Secondary | ICD-10-CM | POA: Insufficient documentation

## 2016-07-05 DIAGNOSIS — M62831 Muscle spasm of calf: Secondary | ICD-10-CM | POA: Diagnosis present

## 2016-07-05 DIAGNOSIS — R262 Difficulty in walking, not elsewhere classified: Secondary | ICD-10-CM | POA: Insufficient documentation

## 2016-07-05 NOTE — Therapy (Signed)
[facility] [address] [place], [place], [address] Phone: [phone]   Fax:  [phone]  Physical Therapy Treatment  Patient Details  Name: Gina Bond MRN: [id number] Date of Birth: 02/23/1932 Referring Provider: Matthew Olin  Encounter Date: 07/05/2016      PT End of Session - 07/05/16 1641    Visit Number 4   Date for PT Re-Evaluation 08/22/16   PT Start Time 1600   PT Stop Time 1641   PT Time Calculation (min) 41 min   Activity Tolerance Patient tolerated treatment well   Behavior During Therapy WFL for tasks assessed/performed      Past Medical History:  Diagnosis Date  . COPD (chronic obstructive pulmonary disease) (HCC)   . Hyperlipidemia   . Hypertension   . Pneumonia, organism unspecified   . Renal disorder   . Renal insufficiency     Past Surgical History:  Procedure Laterality Date  . APPENDECTOMY    . CHOLECYSTECTOMY    . NERVE ABLASION    . RETINAL DETACHMENT SURGERY    . TONSILLECTOMY      There were no vitals filed for this visit.      Subjective Assessment - 07/05/16 1602    Subjective "Im pretty sore the next day"   Currently in Pain? Yes   Pain Score 4    Pain Location Leg   Pain Orientation Right;Left            [facility] PT Assessment - 07/05/16 0001      AROM   Overall AROM Comments AROM of the lumbar WFL, hips WFL's but the LE's are very tight     Strength   Overall Strength Comments 4/5 for the LE's                     [facility] Adult PT Treatment/Exercise - 07/05/16 0001      Lumbar Exercises: Seated   Sit to Stand 10 reps   Other Seated Lumbar Exercises Standing march 3lb 2x10     Knee/Hip Exercises: Aerobic   Nustep L3 x 7min     Knee/Hip Exercises: Seated   Long Arc Quad 2 sets;10 reps;Strengthening;Both   Long Arc Quad Weight 3 lbs.   Ball Squeeze 2x15   Hamstring Curl 2 sets;10 reps   Hamstring Limitations green tband   Abduction/Adduction  Strengthening;Both;2 sets;10 reps   Abd/Adduction Limitations manual resistance                   PT Short Term Goals - 06/21/16 1646      PT SHORT TERM GOAL #1   Title independent with intial HEP   Time 2   Period Weeks   Status New           PT Long Term Goals - 07/05/16 1603      PT LONG TERM GOAL #1   Title decrease pain 50%   Status On-going     PT LONG TERM GOAL #2   Title increase lumbar ROM 25%   Status Partially Met     PT LONG TERM GOAL #3   Title reports charlie horses at night decreased 50%   Status On-going     PT LONG TERM GOAL #4   Title vacuum house without difficulty   Status On-going               Plan - 07/05/16 1641    Clinical Impression Statement Pt   completed all of today's exercises well. Pt does report getting a charlie horse in the bend of her R knew with seated HS curls. Pt ost treatment pt reported no pain and denied modality. "I can feel my legs"   PT Frequency 2x / week   PT Duration 8 weeks   PT Treatment/Interventions Moist Heat;Iontophoresis 4mg/ml Dexamethasone;Electrical Stimulation;Cryotherapy;ADLs/Self Care Home Management;Ultrasound;Gait training;Stair training;Functional mobility training;Therapeutic activities;Therapeutic exercise;Manual techniques;Patient/family education   PT Next Visit Plan Continue to work on flexibility and progress strengthening exercises as tolerated.       Patient will benefit from skilled therapeutic intervention in order to improve the following deficits and impairments:  Abnormal gait, Cardiopulmonary status limiting activity, Decreased activity tolerance, Decreased mobility, Decreased range of motion, Difficulty walking, Decreased strength, Impaired flexibility, Pain, Increased muscle spasms  Visit Diagnosis: Pain in right hip  Difficulty in walking, not elsewhere classified  Cramp and spasm  Muscle spasm of calf     Problem List Patient Active  Problem List   Diagnosis Date Noted  . Hypoxia   . Enteritis 12/06/2015  . Dehydration 12/06/2015  . CAD (coronary artery disease) 12/06/2015  . SBO (small bowel obstruction) (HCC)   . Renal failure (ARF), acute on chronic (HCC)   . Back pain   . Dyspnea   . Small bowel obstruction (HCC)   . Partial small bowel obstruction (HCC) 07/24/2015  . Ileitis 07/24/2015  . CKD (chronic kidney disease) stage 3, GFR 30-59 ml/min 07/24/2015  . Essential hypertension 12/10/2014  . COPD exacerbation (HCC) 12/10/2014  . Unstable angina (HCC) 12/09/2014  . COPD GOLD II  09/13/2013     G  07/05/2016, 4:44 PM  [facility] [address] , [place], [address] Phone: [phone]   Fax:  [phone]  Name: Gina Bond MRN: [id number] Date of Birth: 05/08/1932    

## 2016-07-07 ENCOUNTER — Ambulatory Visit: Payer: Medicare Other | Admitting: Physical Therapy

## 2016-07-07 ENCOUNTER — Encounter: Payer: Self-pay | Admitting: Physical Therapy

## 2016-07-07 DIAGNOSIS — R252 Cramp and spasm: Secondary | ICD-10-CM

## 2016-07-07 DIAGNOSIS — M25551 Pain in right hip: Secondary | ICD-10-CM

## 2016-07-07 DIAGNOSIS — M62831 Muscle spasm of calf: Secondary | ICD-10-CM

## 2016-07-07 DIAGNOSIS — R262 Difficulty in walking, not elsewhere classified: Secondary | ICD-10-CM

## 2016-07-07 NOTE — Therapy (Signed)
[place] [address] [place], [place], [address] Phone: [phone]   Fax:  [phone]  Physical Therapy Treatment  Patient Details  Name: Gina Bond MRN: [id number] Date of Birth: 23-Feb-1932 Referring Provider: Paralee Cancel  Encounter Date: 07/07/2016      PT End of Session - 07/07/16 1641    Visit Number 5   PT Start Time 9470   PT Stop Time 1641   PT Time Calculation (min) 43 min   Activity Tolerance Patient tolerated treatment well   Behavior During Therapy [facility] for tasks assessed/performed      Past Medical History:  Diagnosis Date  . COPD (chronic obstructive pulmonary disease) ([place])   . Hyperlipidemia   . Hypertension   . Pneumonia, organism unspecified   . Renal disorder   . Renal insufficiency     Past Surgical History:  Procedure Laterality Date  . APPENDECTOMY    . CHOLECYSTECTOMY    . NERVE ABLASION    . RETINAL DETACHMENT SURGERY    . TONSILLECTOMY      There were no vitals filed for this visit.      Subjective Assessment - 07/07/16 1555    Subjective "We are doing ok today"   Currently in Pain? Yes   Pain Score 4    Pain Location Leg   Pain Orientation Right                         [facility] Adult PT Treatment/Exercise - 07/07/16 0001      Lumbar Exercises: Aerobic   Stationary Bike L0 x6 minutes     Lumbar Exercises: Machines for Strengthening   Cybex Knee Extension 10lb 2x10   Cybex Knee Flexion 25lb 2x10   Leg Press 20lb 2x10     Lumbar Exercises: Seated   Sit to Stand 10 reps  no UE, x2   Other Seated Lumbar Exercises Standing march      Knee/Hip Exercises: Standing   Hip Abduction 2 sets;Both;10 reps   Hip Extension 2 sets;10 reps;Both   Lateral Step Up 1 set;Both;10 reps;Hand Hold: 0;Step Height: 6"     Knee/Hip Exercises: Seated   Ball Squeeze 2x15   Abduction/Adduction  Strengthening;Both;2 sets;10 reps   Abd/Adduction Limitations manual  resistance                   PT Short Term Goals - 06/21/16 1646      PT SHORT TERM GOAL #1   Title independent with intial HEP   Time 2   Period Weeks   Status New           PT Long Term Goals - 07/05/16 1603      PT LONG TERM GOAL #1   Title decrease pain 50%   Status On-going     PT LONG TERM GOAL #2   Title increase lumbar ROM 25%   Status Partially Met     PT LONG TERM GOAL #3   Title reports charlie horses at night decreased 50%   Status On-going     PT LONG TERM GOAL #4   Title vacuum house without difficulty   Status On-going               Plan - 07/07/16 1641    Clinical Impression Statement Pt with a progression to machine level interventions. Pt able to perform all of today's exercises without an increase or pain. Pt  demos good strength with LE abduction against manual resistance.    Rehab Potential Good   PT Frequency 2x / week   PT Duration 8 weeks   PT Treatment/Interventions Moist Heat;Iontophoresis 33m/ml Dexamethasone;Electrical Stimulation;Cryotherapy;ADLs/Self Care Home Management;Ultrasound;Gait training;Stair training;Functional mobility training;Therapeutic activities;Therapeutic exercise;Manual techniques;Patient/family education   PT Next Visit Plan Continue to work on flexibility and progress strengthening exercises as tolerated.       Patient will benefit from skilled therapeutic intervention in order to improve the following deficits and impairments:  Abnormal gait, Cardiopulmonary status limiting activity, Decreased activity tolerance, Decreased mobility, Decreased range of motion, Difficulty walking, Decreased strength, Impaired flexibility, Pain, Increased muscle spasms  Visit Diagnosis: Pain in right hip  Difficulty in walking, not elsewhere classified  Cramp and spasm  Muscle spasm of calf     Problem List Patient Active Problem List   Diagnosis Date Noted  . Hypoxia   . Enteritis 12/06/2015  .  Dehydration 12/06/2015  . CAD (coronary artery disease) 12/06/2015  . SBO (small bowel obstruction) (HC[place]   . Renal failure (ARF), acute on chronic (HCC)   . Back pain   . Dyspnea   . Small bowel obstruction (H[place]vel[place]   . Partial small bowel obstruction (H[place] 07/24/2015  . Ileitis 07/24/2015  . CKD (chronic kidney disease) stage 3, GFR 30-59 ml/min 07/24/2015  . Essential hypertension 12/10/2014  . COPD exacerbation (HC[place] 12/10/2014  . Unstable angina (HH[place] 12/09/2014  . COPD GOLD II  09/13/2013    RScot Jun PTA 07/07/2016, 4:44 PM  [place] [address] [address]Phone: [phone]  Fax:  [phone] Name: Gina TurveyMRN: [id number] of Birth: [birth year]

## 2016-07-12 ENCOUNTER — Encounter: Payer: Self-pay | Admitting: Physical Therapy

## 2016-07-12 ENCOUNTER — Ambulatory Visit: Payer: Medicare Other | Admitting: Physical Therapy

## 2016-07-12 DIAGNOSIS — R252 Cramp and spasm: Secondary | ICD-10-CM

## 2016-07-12 DIAGNOSIS — M62831 Muscle spasm of calf: Secondary | ICD-10-CM

## 2016-07-12 DIAGNOSIS — R262 Difficulty in walking, not elsewhere classified: Secondary | ICD-10-CM

## 2016-07-12 DIAGNOSIS — M25551 Pain in right hip: Secondary | ICD-10-CM | POA: Diagnosis not present

## 2016-07-12 NOTE — Therapy (Signed)
[place] [address] [place], [place], [address] Phone: [phone]   Fax:  [phone]  Physical Therapy Treatment  Patient Details  Name: Gina Bond MRN: [id number] Date of Birth: March 18, 1932 Referring Provider: Paralee Cancel  Encounter Date: 07/12/2016      PT End of Session - 07/12/16 1428    Visit Number 6   Date for PT Re-Evaluation 08/22/16   PT Start Time 1345   PT Stop Time 1428   PT Time Calculation (min) 43 min   Activity Tolerance Patient tolerated treatment well   Behavior During Therapy [facility] for tasks assessed/performed      Past Medical History:  Diagnosis Date  . COPD (chronic obstructive pulmonary disease) ([place])   . Hyperlipidemia   . Hypertension   . Pneumonia, organism unspecified   . Renal disorder   . Renal insufficiency     Past Surgical History:  Procedure Laterality Date  . APPENDECTOMY    . CHOLECYSTECTOMY    . NERVE ABLASION    . RETINAL DETACHMENT SURGERY    . TONSILLECTOMY      There were no vitals filed for this visit.      Subjective Assessment - 07/12/16 1345    Subjective "We are doing, I am neve completely pain free, but I feel better than when I walked in the door. "   Currently in Pain? Yes   Pain Score 3    Pain Location Leg   Pain Orientation Right                         [facility] Adult PT Treatment/Exercise - 07/12/16 0001      Ambulation/Gait   Gait Comments Stair negotiation 2 flights alternating pattern one rail R. fatigues quickly     Lumbar Exercises: Aerobic   Stationary Bike NuStep L4 x7 in      Lumbar Exercises: Machines for Strengthening   Cybex Knee Extension 10lb 2x15   Cybex Knee Flexion 25lb 2x15   Leg Press 20lb 3x10     Lumbar Exercises: Seated   Sit to Stand 10 reps  x2, some UE support with some reps   Other Seated Lumbar Exercises Standing march 2lb 2x10   Other Seated Lumbar Exercises Seated HS stretch 10 sec  x3 each     Knee/Hip Exercises: Standing   Hip Abduction 2 sets;Both;10 reps   Abduction Limitations 2lb   Extension Limitations 2lb                  PT Short Term Goals - 06/21/16 1646      PT SHORT TERM GOAL #1   Title independent with intial HEP   Time 2   Period Weeks   Status New           PT Long Term Goals - 07/12/16 1402      PT LONG TERM GOAL #1   Title decrease pain 50%   Status On-going     PT LONG TERM GOAL #2   Title increase lumbar ROM 25%   Status Partially Met     PT LONG TERM GOAL #3   Title reports charlie horses at night decreased 50%   Status Partially Met     PT LONG TERM GOAL #4   Title vacuum house without difficulty   Status Achieved               Plan - 07/12/16  1428    Clinical Impression Statement Pt with tight bilat HS. Pt tolerated all interventions with additional weight and or repetition. Pt with a decrease activity tolerance with stair negotiation, required some standing rest breaks.    Rehab Potential Good   PT Frequency 2x / week   PT Duration 8 weeks   PT Treatment/Interventions Moist Heat;Iontophoresis 88m/ml Dexamethasone;Electrical Stimulation;Cryotherapy;ADLs/Self Care Home Management;Ultrasound;Gait training;Stair training;Functional mobility training;Therapeutic activities;Therapeutic exercise;Manual techniques;Patient/family education   PT Next Visit Plan Flexibility and functional training.      Patient will benefit from skilled therapeutic intervention in order to improve the following deficits and impairments:  Abnormal gait, Cardiopulmonary status limiting activity, Decreased activity tolerance, Decreased mobility, Decreased range of motion, Difficulty walking, Decreased strength, Impaired flexibility, Pain, Increased muscle spasms  Visit Diagnosis: Pain in right hip  Difficulty in walking, not elsewhere classified  Cramp and spasm  Muscle spasm of calf     Problem List Patient Active  Problem List   Diagnosis Date Noted  . Hypoxia   . Enteritis 12/06/2015  . Dehydration 12/06/2015  . CAD (coronary artery disease) 12/06/2015  . SBO (small bowel obstruction) (HMy[place]   . Renal failure (ARF), acute on chronic (HCC)   . Back pain   . Dyspnea   . Small bowel obstruction (HDur[place]   . Partial small bowel obstruction (HR[place] 07/24/2015  . Ileitis 07/24/2015  . CKD (chronic kidney disease) stage 3, GFR 30-59 ml/min 07/24/2015  . Essential hypertension 12/10/2014  . COPD exacerbation (HSan Pierre 12/10/2014  . Unstable angina (HSandy [place] 12/09/2014  . COPD GOLD II  09/13/2013    RScot Jun PTA  07/12/2016, 2:30 PM  [facility] [facility]eme[facility]otts CampSuite [address] [address]Phone: [phone]  Fax:  [phone] Name: Gina BrickMRN: [id number] of Birth: 1May 10, 1933

## 2016-07-14 ENCOUNTER — Ambulatory Visit: Payer: Medicare Other | Admitting: Physical Therapy

## 2016-07-14 ENCOUNTER — Encounter: Payer: Self-pay | Admitting: Physical Therapy

## 2016-07-14 DIAGNOSIS — M25551 Pain in right hip: Secondary | ICD-10-CM

## 2016-07-14 DIAGNOSIS — R252 Cramp and spasm: Secondary | ICD-10-CM

## 2016-07-14 DIAGNOSIS — M62831 Muscle spasm of calf: Secondary | ICD-10-CM

## 2016-07-14 DIAGNOSIS — R262 Difficulty in walking, not elsewhere classified: Secondary | ICD-10-CM

## 2016-07-14 NOTE — Therapy (Signed)
[place] [address] [place], [place], [address] Phone: [phone]   Fax:  [phone]  Physical Therapy Treatment  Patient Details  Name: Gina Bond MRN: [id number] Date of Birth: 01-26-1932 Referring Provider: Paralee Cancel  Encounter Date: 07/14/2016      PT End of Session - 07/14/16 1427    Visit Number 7   Date for PT Re-Evaluation 08/22/16   PT Start Time 1345   PT Stop Time 1427   PT Time Calculation (min) 42 min   Activity Tolerance Patient tolerated treatment well   Behavior During Therapy [facility] for tasks assessed/performed      Past Medical History:  Diagnosis Date  . COPD (chronic obstructive pulmonary disease) ([place])   . Hyperlipidemia   . Hypertension   . Pneumonia, organism unspecified   . Renal disorder   . Renal insufficiency     Past Surgical History:  Procedure Laterality Date  . APPENDECTOMY    . CHOLECYSTECTOMY    . NERVE ABLASION    . RETINAL DETACHMENT SURGERY    . TONSILLECTOMY      There were no vitals filed for this visit.      Subjective Assessment - 07/14/16 1348    Subjective "We are doing, Its better than when I started"   Currently in Pain? Yes   Pain Score 2    Pain Location Leg   Pain Orientation Right                         [facility] Adult PT Treatment/Exercise - 07/14/16 0001      Ambulation/Gait   Gait Comments Stair negotiation 2 flights alternating pattern one rail R. fatigues quickly     Lumbar Exercises: Stretches   Passive Hamstring Stretch 3 reps;10 seconds   Piriformis Stretch 3 reps;10 seconds     Lumbar Exercises: Machines for Strengthening   Cybex Knee Extension 10lb 2x15   Cybex Knee Flexion 25lb 2x15     Knee/Hip Exercises: Standing   Walking with Sports Cord 30lb side stepping x3 each   Other Standing Knee Exercises Heel raises 2x15                   PT Short Term Goals - 06/21/16 1646      PT SHORT TERM  GOAL #1   Title independent with intial HEP   Time 2   Period Weeks   Status New           PT Long Term Goals - 07/14/16 1414      PT LONG TERM GOAL #1   Title decrease pain 50%     PT LONG TERM GOAL #2   Title increase lumbar ROM 25%   Status Partially Met     PT LONG TERM GOAL #3   Title reports charlie horses at night decreased 50%   Status Partially Met     PT LONG TERM GOAL #4   Title vacuum house without difficulty   Status Achieved               Plan - 07/14/16 1427    Clinical Impression Statement Pt continues to fatigue with functional activities, required 3 breaks on the second set of leg extensions due to LE fatigue. Pt with some difficulty controlling the resistance with sport cord walking.   Rehab Potential Good   PT Frequency 2x / week   PT Duration 8 weeks  PT Treatment/Interventions Moist Heat;Iontophoresis 18m/ml Dexamethasone;Electrical Stimulation;Cryotherapy;ADLs/Self Care Home Management;Ultrasound;Gait training;Stair training;Functional mobility training;Therapeutic activities;Therapeutic exercise;Manual techniques;Patient/family education   PT Next Visit Plan Flexibility and functional training.      Patient will benefit from skilled therapeutic intervention in order to improve the following deficits and impairments:  Abnormal gait, Cardiopulmonary status limiting activity, Decreased activity tolerance, Decreased mobility, Decreased range of motion, Difficulty walking, Decreased strength, Impaired flexibility, Pain, Increased muscle spasms  Visit Diagnosis: Pain in right hip  Difficulty in walking, not elsewhere classified  Cramp and spasm  Muscle spasm of calf     Problem List Patient Active Problem List   Diagnosis Date Noted  . Hypoxia   . Enteritis 12/06/2015  . Dehydration 12/06/2015  . CAD (coronary artery disease) 12/06/2015  . SBO (small bowel obstruction) (HValley [place]   . Renal failure (ARF), acute on chronic (HCC)   .  Back pain   . Dyspnea   . Small bowel obstruction (HCesar Chavez   . Partial small bowel obstruction (HKanaw[place] 07/24/2015  . Ileitis 07/24/2015  . CKD (chronic kidney disease) stage 3, GFR 30-59 ml/min 07/24/2015  . Essential hypertension 12/10/2014  . COPD exacerbation (HRu[place] 12/10/2014  . Unstable angina (HGreenbackville 12/09/2014  . COPD GOLD II  09/13/2013    RScot Jun PTA  07/14/2016, 2:30 PM  [address]tha[address]ecumsehSuite [address] [address]Phone: [phone]  Fax:  [phone] Name: CKioni StahlMRN: [id number] of Birth: [birth year]

## 2016-07-19 ENCOUNTER — Ambulatory Visit: Payer: Medicare Other | Admitting: Physical Therapy

## 2016-07-19 ENCOUNTER — Encounter: Payer: Self-pay | Admitting: Physical Therapy

## 2016-07-19 DIAGNOSIS — M25551 Pain in right hip: Secondary | ICD-10-CM

## 2016-07-19 DIAGNOSIS — M62831 Muscle spasm of calf: Secondary | ICD-10-CM

## 2016-07-19 DIAGNOSIS — R252 Cramp and spasm: Secondary | ICD-10-CM

## 2016-07-19 DIAGNOSIS — R262 Difficulty in walking, not elsewhere classified: Secondary | ICD-10-CM

## 2016-07-19 NOTE — Therapy (Signed)
[place], [place], [address] Phone: [phone]   Fax:  [phone]  Physical Therapy Treatment  Patient Details  Name: Gina Bond MRN: [id number] Date of Birth: [birth year] Referring Provider: Paralee Cancel  Encounter Date: 07/19/2016      PT End of Session - 07/19/16 1645    Visit Number 8   Date for PT Re-Evaluation 08/22/16   PT Start Time 1600   PT Stop Time 1646   PT Time Calculation (min) 46 min   Activity Tolerance Patient tolerated treatment well   Behavior During Therapy [facility] for tasks assessed/performed      Past Medical History:  Diagnosis Date  . COPD (chronic obstructive pulmonary disease) ([place])   . Hyperlipidemia   . Hypertension   . Pneumonia, organism unspecified   . Renal disorder   . Renal insufficiency     Past Surgical History:  Procedure Laterality Date  . APPENDECTOMY    . CHOLECYSTECTOMY    . NERVE ABLASION    . RETINAL DETACHMENT SURGERY    . TONSILLECTOMY      There were no vitals filed for this visit.      Subjective Assessment - 07/19/16 1607    Subjective "Im doing all right"   Currently in Pain? Yes   Pain Score 3    Pain Location Leg   Pain Orientation Left;Right            [facility] PT Assessment - 07/19/16 0001      AROM   Overall AROM Comments AROM of the lumbar WFL, hips WFL's     Strength   Overall Strength Comments 5/5 for the LE's                     [facility] Adult PT Treatment/Exercise - 07/19/16 0001      Ambulation/Gait   Gait Comments Stair negotiation 1 flights alternating pattern one rail R. fatigues quickly     Lumbar Exercises: Stretches   Passive Hamstring Stretch 10 seconds;5 reps   Piriformis Stretch 3 reps;10 seconds     Lumbar Exercises: Aerobic   Stationary Bike 39mn L 0      Lumbar Exercises: Machines for Strengthening   Cybex Knee Flexion 25lb 2x15   Leg Press 30lb 3x10     Knee/Hip  Exercises: Standing   Walking with Sports Cord 30lb side stepping x3 each     Knee/Hip Exercises: Seated   Sit to Sand 10 reps  x2 holding blue ball                  PT Short Term Goals - 06/21/16 1646      PT SHORT TERM GOAL #1   Title independent with intial HEP   Time 2   Period Weeks   Status New           PT Long Term Goals - 07/19/16 1627      PT LONG TERM GOAL #1   Title decrease pain 50%     PT LONG TERM GOAL #3   Title reports charlie horses at night decreased 50%   Status Partially Met               Plan - 07/19/16 1645    Clinical Impression Statement Pt with very tight HS on both LE, less fatigue with stair negotiation. Pt is very hyper verbal and requires cues to redirect. No issues with machine level  interventions   Rehab Potential Good   PT Frequency 2x / week   PT Duration 8 weeks   PT Treatment/Interventions Moist Heat;Iontophoresis 41m/ml Dexamethasone;Electrical Stimulation;Cryotherapy;ADLs/Self Care Home Management;Ultrasound;Gait training;Stair training;Functional mobility training;Therapeutic activities;Therapeutic exercise;Manual techniques;Patient/family education   PT Next Visit Plan Flexibility and functional training.      Patient will benefit from skilled therapeutic intervention in order to improve the following deficits and impairments:  Abnormal gait, Cardiopulmonary status limiting activity, Decreased activity tolerance, Decreased mobility, Decreased range of motion, Difficulty walking, Decreased strength, Impaired flexibility, Pain, Increased muscle spasms  Visit Diagnosis: Difficulty in walking, not elsewhere classified  Cramp and spasm  Muscle spasm of calf  Pain in right hip     Problem List Patient Active Problem List   Diagnosis Date Noted  . Hypoxia   . Enteritis 12/06/2015  . Dehydration 12/06/2015  . CAD (coronary artery disease) 12/06/2015  . SBO (small bowel obstruction) (HNapan[place]   . Renal  failure (ARF), acute on chronic (HCC)   . Back pain   . Dyspnea   . Small bowel obstruction (HWood[place]   . Partial small bowel obstruction (HTrout Creek 07/24/2015  . Ileitis 07/24/2015  . CKD (chronic kidney disease) stage 3, GFR 30-59 ml/min 07/24/2015  . Essential hypertension 12/10/2014  . COPD exacerbation (HStone 12/10/2014  . Unstable angina (HRandallstown 12/09/2014  . COPD GOLD II  09/13/2013    RScot Jun PTA  07/19/2016, 4:50 PM  [address] [address]Phone: [phone]  Fax:  [phone] Name: Gina TexidorMRN: [id number] of Birth: 1Mar 17, 1933

## 2016-07-21 ENCOUNTER — Ambulatory Visit: Payer: Medicare Other | Admitting: Physical Therapy

## 2016-07-21 ENCOUNTER — Encounter: Payer: Self-pay | Admitting: Physical Therapy

## 2016-07-21 DIAGNOSIS — M62831 Muscle spasm of calf: Secondary | ICD-10-CM

## 2016-07-21 DIAGNOSIS — M25551 Pain in right hip: Secondary | ICD-10-CM

## 2016-07-21 DIAGNOSIS — R252 Cramp and spasm: Secondary | ICD-10-CM

## 2016-07-21 DIAGNOSIS — R262 Difficulty in walking, not elsewhere classified: Secondary | ICD-10-CM

## 2016-07-21 NOTE — Therapy (Signed)
[address] [place], [place], [address] Phone: [phone]   Fax:  [phone]  Physical Therapy Treatment  Patient Details  Name: Gina Bond MRN: [id number] Date of Birth: Sep 29, 1932 Referring Provider: Paralee Cancel  Encounter Date: 07/21/2016      PT End of Session - 07/21/16 1529    Visit Number 9   Date for PT Re-Evaluation 08/22/16   PT Start Time 1445   PT Stop Time 1529   PT Time Calculation (min) 44 min   Activity Tolerance Patient tolerated treatment well   Behavior During Therapy [facility] for tasks assessed/performed      Past Medical History:  Diagnosis Date  . COPD (chronic obstructive pulmonary disease) ([place])   . Hyperlipidemia   . Hypertension   . Pneumonia, organism unspecified   . Renal disorder   . Renal insufficiency     Past Surgical History:  Procedure Laterality Date  . APPENDECTOMY    . CHOLECYSTECTOMY    . NERVE ABLASION    . RETINAL DETACHMENT SURGERY    . TONSILLECTOMY      There were no vitals filed for this visit.      Subjective Assessment - 07/21/16 1446    Subjective "You have to take it easy on me today, my knee and my sciatica"   Currently in Pain? Yes   Pain Score 6    Pain Location Hip   Pain Orientation Right                         [facility] Adult PT Treatment/Exercise - 07/21/16 0001      Lumbar Exercises: Stretches   Passive Hamstring Stretch 10 seconds;5 reps   ITB Stretch 3 reps;10 seconds  LLE   Piriformis Stretch 3 reps;10 seconds     Lumbar Exercises: Aerobic   Stationary Bike 1mn L 0      Lumbar Exercises: Machines for Strengthening   Leg Press 20lb 3x10   Other Lumbar Machine Exercise Seated rows & lats  25lb 2x10     Lumbar Exercises: Seated   Other Seated Lumbar Exercises Standing march 3lb 2x10;    Other Seated Lumbar Exercises Standing straight arm pull downs 15lb 2x15                  PT Short  Term Goals - 06/21/16 1646      PT SHORT TERM GOAL #1   Title independent with intial HEP   Time 2   Period Weeks   Status New           PT Long Term Goals - 07/19/16 1627      PT LONG TERM GOAL #1   Title decrease pain 50%     PT LONG TERM GOAL #3   Title reports charlie horses at night decreased 50%   Status Partially Met               Plan - 07/21/16 1529    Clinical Impression Statement Pt with c/o her sciatica returning this date. Pt with no c/o increase pain with LE interventions. Pt tolerated all core stabilization interventions well. Does reports some shoulder popping with straight arm pull down.     Rehab Potential Good   PT Frequency 2x / week   PT Duration 8 weeks   PT Treatment/Interventions Moist Heat;Iontophoresis 424mml Dexamethasone;Electrical Stimulation;Cryotherapy;ADLs/Self Care Home Management;Ultrasound;Gait training;Stair training;Functional mobility training;Therapeutic activities;Therapeutic exercise;Manual techniques;Patient/family education  PT Next Visit Plan assess treatment, get sciatica to calm down, Flexibility and functional training.      Patient will benefit from skilled therapeutic intervention in order to improve the following deficits and impairments:  Abnormal gait, Cardiopulmonary status limiting activity, Decreased activity tolerance, Decreased mobility, Decreased range of motion, Difficulty walking, Decreased strength, Impaired flexibility, Pain, Increased muscle spasms  Visit Diagnosis: Difficulty in walking, not elsewhere classified  Cramp and spasm  Muscle spasm of calf  Pain in right hip     Problem List Patient Active Problem List   Diagnosis Date Noted  . Hypoxia   . Enteritis 12/06/2015  . Dehydration 12/06/2015  . CAD (coronary artery disease) 12/06/2015  . SBO (small bowel obstruction) ([place])   . Renal failure (ARF), acute on chronic (HCC)   . Back pain   . Dyspnea   . Small bowel obstruction ([place])    . Partial small bowel obstruction ([place]) 07/24/2015  . Ileitis 07/24/2015  . CKD (chronic kidney disease) stage 3, GFR 30-59 ml/min 07/24/2015  . Essential hypertension 12/10/2014  . COPD exacerbation ([place]) 12/10/2014  . Unstable angina ([place]) 12/09/2014  . COPD GOLD II  09/13/2013    Scot Jun, PTA  07/21/2016, 3:39 PM  [address] [place], [place], [address] Phone: [phone]   Fax:  [phone]  Name: Gina Bond MRN: [id number] Date of Birth: [birth year]

## 2016-07-26 ENCOUNTER — Ambulatory Visit: Payer: Medicare Other | Admitting: Physical Therapy

## 2016-07-26 ENCOUNTER — Encounter: Payer: Self-pay | Admitting: Physical Therapy

## 2016-07-26 DIAGNOSIS — M25551 Pain in right hip: Secondary | ICD-10-CM | POA: Diagnosis not present

## 2016-07-26 DIAGNOSIS — M62831 Muscle spasm of calf: Secondary | ICD-10-CM

## 2016-07-26 DIAGNOSIS — R262 Difficulty in walking, not elsewhere classified: Secondary | ICD-10-CM

## 2016-07-26 DIAGNOSIS — R252 Cramp and spasm: Secondary | ICD-10-CM

## 2016-07-26 NOTE — Therapy (Signed)
[address] [place], [place], [address] Phone: [phone]   Fax:  [phone]  Physical Therapy Treatment  Patient Details  Name: Gina Bond MRN: [id number] Date of Birth: Nov 06, 1932 Referring Provider: Paralee Cancel  Encounter Date: 07/26/2016      PT End of Session - 07/26/16 1339    Visit Number 10   Date for PT Re-Evaluation 08/22/16   PT Start Time 1300   PT Stop Time 1343   PT Time Calculation (min) 43 min   Activity Tolerance Patient tolerated treatment well   Behavior During Therapy [facility] for tasks assessed/performed      Past Medical History:  Diagnosis Date  . COPD (chronic obstructive pulmonary disease) ([place])   . Hyperlipidemia   . Hypertension   . Pneumonia, organism unspecified   . Renal disorder   . Renal insufficiency     Past Surgical History:  Procedure Laterality Date  . APPENDECTOMY    . CHOLECYSTECTOMY    . NERVE ABLASION    . RETINAL DETACHMENT SURGERY    . TONSILLECTOMY      There were no vitals filed for this visit.      Subjective Assessment - 07/26/16 1300    Subjective "My sciatica has gotten a lot better, But I still havre little quirk"   Currently in Pain? Yes   Pain Score 4    Pain Location Hip   Pain Orientation Right                         [facility] Adult PT Treatment/Exercise - 07/26/16 0001      High Level Balance   High Level Balance Activities Side stepping     Lumbar Exercises: Stretches   Passive Hamstring Stretch 10 seconds;5 reps   Piriformis Stretch 10 seconds;5 reps     Lumbar Exercises: Aerobic   Stationary Bike 26mn L 3      Lumbar Exercises: Machines for Strengthening   Cybex Knee Flexion 25lb 2x15   Leg Press 20lb 3x10   Other Lumbar Machine Exercise Seated rows & lats  25lb 2x10     Lumbar Exercises: Supine   Clam 20 reps;1 second   Clam Limitations blue Tband   Bridge 15 reps;1 second  x2   Straight Leg  Raise 1 second;15 reps   Other Supine Lumbar Exercises Hooklying ball squeezes                  PT Short Term Goals - 06/21/16 1646      PT SHORT TERM GOAL #1   Title independent with intial HEP   Time 2   Period Weeks   Status New           PT Long Term Goals - 07/26/16 1343      PT LONG TERM GOAL #1   Title decrease pain 50%   Status Achieved     PT LONG TERM GOAL #2   Title increase lumbar ROM 25%   Status Achieved     PT LONG TERM GOAL #3   Title reports charlie horses at night decreased 50%   Status Partially Met     PT LONG TERM GOAL #4   Title vacuum house without difficulty   Status Achieved               Plan - 07/26/16 1339    Clinical Impression Statement Pt with less issues with  sciatica this date. Tolerated isolated hip strengthen in supine position well. Pt does fatigue easily with exercises and requires multiple breaks.   Rehab Potential Good   PT Frequency 2x / week   PT Duration 8 weeks   PT Treatment/Interventions Moist Heat;Iontophoresis 51m/ml Dexamethasone;Electrical Stimulation;Cryotherapy;ADLs/Self Care Home Management;Ultrasound;Gait training;Stair training;Functional mobility training;Therapeutic activities;Therapeutic exercise;Manual techniques;Patient/family education   PT Next Visit Plan Flexibility and functional training, start talks about d/c      Patient will benefit from skilled therapeutic intervention in order to improve the following deficits and impairments:  Abnormal gait, Cardiopulmonary status limiting activity, Decreased activity tolerance, Decreased mobility, Decreased range of motion, Difficulty walking, Decreased strength, Impaired flexibility, Pain, Increased muscle spasms  Visit Diagnosis: Difficulty in walking, not elsewhere classified  Cramp and spasm  Muscle spasm of calf  Pain in right hip     Problem List Patient Active Problem List   Diagnosis Date Noted  . Hypoxia   . Enteritis  12/06/2015  . Dehydration 12/06/2015  . CAD (coronary artery disease) 12/06/2015  . S[place] (small bowel obstruction) ([place]   . Renal failure (ARF), acute on chronic (HCC)   . Back pain   . Dyspnea   . Small bowel obstruction ([place]   . Partial small bowel obstruction ([place] 07/24/2015  . Ileitis 07/24/2015  . CKD (chronic kidney disease) stage 3, GFR 30-59 ml/min 07/24/2015  . Essential hypertension 12/10/2014  . COPD exacerbation ([place] 12/10/2014  . Unstable angina ([place] 12/09/2014  . COPD GOLD II  09/13/2013    RScot Jun PTA 07/26/2016, 1:44 PM  [address] [address]Phone: [phone]  Fax:  [phone] Name: Gina SparlinMRN: [id number] of Birth: 104-13-1933

## 2016-07-28 ENCOUNTER — Ambulatory Visit: Payer: Medicare Other | Admitting: Physical Therapy

## 2016-07-28 ENCOUNTER — Encounter: Payer: Self-pay | Admitting: Physical Therapy

## 2016-07-28 DIAGNOSIS — M25551 Pain in right hip: Secondary | ICD-10-CM

## 2016-07-28 DIAGNOSIS — R252 Cramp and spasm: Secondary | ICD-10-CM

## 2016-07-28 DIAGNOSIS — R262 Difficulty in walking, not elsewhere classified: Secondary | ICD-10-CM

## 2016-07-28 DIAGNOSIS — M62831 Muscle spasm of calf: Secondary | ICD-10-CM

## 2016-07-28 NOTE — Therapy (Signed)
[address] [address] [place], [place], [address] Phone: [phone]   Fax:  [phone]  Physical Therapy Treatment  Patient Details  Name: Gina Bond MRN: [id number] Date of Birth: 1932/09/25 Referring Provider: Paralee Cancel  Encounter Date: 07/28/2016      PT End of Session - 07/28/16 1512    Visit Number 11   Date for PT Re-Evaluation 08/22/16   PT Start Time 1425   PT Stop Time 1512   PT Time Calculation (min) 47 min   Activity Tolerance Patient tolerated treatment well   Behavior During Therapy [facility] for tasks assessed/performed      Past Medical History:  Diagnosis Date  . COPD (chronic obstructive pulmonary disease) ([place])   . Hyperlipidemia   . Hypertension   . Pneumonia, organism unspecified   . Renal disorder   . Renal insufficiency     Past Surgical History:  Procedure Laterality Date  . APPENDECTOMY    . CHOLECYSTECTOMY    . NERVE ABLASION    . RETINAL DETACHMENT SURGERY    . TONSILLECTOMY      There were no vitals filed for this visit.      Subjective Assessment - 07/28/16 1427    Subjective "Ive done pretty good,"   Currently in Pain? Yes   Pain Score 3    Pain Location Leg   Pain Orientation Right                         [facility] Adult PT Treatment/Exercise - 07/28/16 0001      Ambulation/Gait   Gait Comments Walked outside and around building with pt, up and down slopes. Pt with a heavy posterior lean when ambulating down hill. Pt gait speed increase with initial down shill walking but able to correct     Lumbar Exercises: Machines for Strengthening   Cybex Knee Flexion 25lb 2x15   Leg Press 30lb 3x10     Lumbar Exercises: Seated   Sit to Stand 10 reps  OHP with blue ball x2      Lumbar Exercises: Supine   Clam 1 second;15 reps  x2   Clam Limitations blue Tband   Other Supine Lumbar Exercises Hook lying ball squeezes                   PT Short Term Goals - 06/21/16 1646      PT SHORT TERM GOAL #1   Title independent with intial HEP   Time 2   Period Weeks   Status New           PT Long Term Goals - 07/28/16 1452      PT LONG TERM GOAL #1   Title decrease pain 50%   Status Achieved     PT LONG TERM GOAL #2   Title increase lumbar ROM 25%   Status Achieved     PT LONG TERM GOAL #3   Title reports charlie horses at night decreased 50%   Status Partially Met     PT LONG TERM GOAL #4   Title vacuum house without difficulty   Baseline Reports soreness the next day   Status Partially Met               Plan - 07/28/16 1512    Clinical Impression Statement Pt reports that she thinks she may still have trouble vacuuming despite reporting that she had no issue previously.  She stated that she can vacuum fine just sore the next day. Pt with a heavy posterior lean walking down hill able to correct some with cues. Pt educated that being sore is a bnormal response to activity.    Rehab Potential Good   PT Frequency 2x / week   PT Duration 8 weeks   PT Treatment/Interventions Moist Heat;Iontophoresis 64m/ml Dexamethasone;Electrical Stimulation;Cryotherapy;ADLs/Self Care Home Management;Ultrasound;Gait training;Stair training;Functional mobility training;Therapeutic activities;Therapeutic exercise;Manual techniques;Patient/family education   PT Next Visit Plan Progress towards independent program      Patient will benefit from skilled therapeutic intervention in order to improve the following deficits and impairments:  Abnormal gait, Cardiopulmonary status limiting activity, Decreased activity tolerance, Decreased mobility, Decreased range of motion, Difficulty walking, Decreased strength, Impaired flexibility, Pain, Increased muscle spasms  Visit Diagnosis: Difficulty in walking, not elsewhere classified  Cramp and spasm  Muscle spasm of calf  Pain in right hip       G-Codes - [id number]     Functional Assessment Tool Used foto 55% limitation   Functional Limitation Mobility: Walking and moving around   Mobility: Walking and Moving Around Current Status [phone] At least 40 percent but less than 60 percent impaired, limited or restricted   Mobility: Walking and Moving Around Goal Status [phone] At least 40 percent but less than 60 percent impaired, limited or restricted      Problem List Patient Active Problem List   Diagnosis Date Noted  . Hypoxia   . Enteritis 12/06/2015  . Dehydration 12/06/2015  . CAD (coronary artery disease) 12/06/2015  . SBO (small bowel obstruction) ([place]   . Renal failure (ARF), acute on chronic (HCC)   . Back pain   . Dyspnea   . Small bowel obstruction ([place]   . Partial small bowel obstruction ([place] 07/24/2015  . Ileitis 07/24/2015  . CKD (chronic kidney disease) stage 3, GFR 30-59 ml/min 07/24/2015  . Essential hypertension 12/10/2014  . COPD exacerbation ([place] 12/10/2014  . Unstable angina ([place] 12/09/2014  . COPD GOLD II  09/13/2013    RScot Jun PTA  07/28/2016, 3:18 PM  [address] [address]Phone: [phone]  Fax:  [phone] Name: CCotina FreedmanMRN: [id number] of Birth: [birth year]

## 2016-07-30 ENCOUNTER — Emergency Department (HOSPITAL_BASED_OUTPATIENT_CLINIC_OR_DEPARTMENT_OTHER)
Admission: EM | Admit: 2016-07-30 | Discharge: 2016-07-30 | Disposition: A | Discharge status: Home / Self Care | Payer: Medicare Other | Attending: Emergency Medicine | Admitting: Emergency Medicine

## 2016-07-30 ENCOUNTER — Encounter (HOSPITAL_BASED_OUTPATIENT_CLINIC_OR_DEPARTMENT_OTHER): Payer: Self-pay | Admitting: Emergency Medicine

## 2016-07-30 DIAGNOSIS — I10 Essential (primary) hypertension: Secondary | ICD-10-CM | POA: Diagnosis not present

## 2016-07-30 DIAGNOSIS — Y929 Unspecified place or not applicable: Secondary | ICD-10-CM | POA: Diagnosis not present

## 2016-07-30 DIAGNOSIS — S0501XA Injury of conjunctiva and corneal abrasion without foreign body, right eye, initial encounter: Secondary | ICD-10-CM

## 2016-07-30 DIAGNOSIS — Z79899 Other long term (current) drug therapy: Secondary | ICD-10-CM | POA: Insufficient documentation

## 2016-07-30 DIAGNOSIS — Y999 Unspecified external cause status: Secondary | ICD-10-CM | POA: Diagnosis not present

## 2016-07-30 DIAGNOSIS — Z87891 Personal history of nicotine dependence: Secondary | ICD-10-CM | POA: Insufficient documentation

## 2016-07-30 DIAGNOSIS — Y939 Activity, unspecified: Secondary | ICD-10-CM | POA: Insufficient documentation

## 2016-07-30 DIAGNOSIS — H5711 Ocular pain, right eye: Secondary | ICD-10-CM | POA: Diagnosis present

## 2016-07-30 DIAGNOSIS — X58XXXA Exposure to other specified factors, initial encounter: Secondary | ICD-10-CM | POA: Diagnosis not present

## 2016-07-30 DIAGNOSIS — J449 Chronic obstructive pulmonary disease, unspecified: Secondary | ICD-10-CM | POA: Insufficient documentation

## 2016-07-30 DIAGNOSIS — Z7982 Long term (current) use of aspirin: Secondary | ICD-10-CM | POA: Insufficient documentation

## 2016-07-30 MED ORDER — TETRACAINE HCL 0.5 % OP SOLN
2.0000 [drp] | Freq: Once | OPHTHALMIC | Status: AC
  Administered 2016-07-30: 2 [drp] via OPHTHALMIC
  Filled 2016-07-30: qty 4

## 2016-07-30 MED ORDER — FLUORESCEIN SODIUM 1 MG OP STRP
1.0000 | ORAL_STRIP | Freq: Once | OPHTHALMIC | Status: AC
  Administered 2016-07-30: 1 via OPHTHALMIC
  Filled 2016-07-30: qty 1

## 2016-07-30 MED ORDER — ERYTHROMYCIN 5 MG/GM OP OINT
TOPICAL_OINTMENT | OPHTHALMIC | 0 refills | Status: DC
Start: 2016-07-30 — End: 2018-06-02

## 2016-07-30 NOTE — ED Notes (Signed)
Woods lamp at bedside.  

## 2016-07-30 NOTE — ED Triage Notes (Signed)
Patient reports that her right eye started to hurt about 2 weeks ago, today the pain is worse and feels like an eye lash is scratching her eye

## 2016-07-30 NOTE — ED Provider Notes (Signed)
[facility] DEPT [facility] Provider Note   CSN: [id number] Arrival date & time: 07/30/16  1542  By signing my name below, I, Hansel Feinstein, attest that this documentation has been prepared under the direction and in the presence of  [employer], PA-C. Electronically Signed: Hansel Feinstein, ED Scribe. 07/30/16. 7:05 PM.    History   Chief Complaint Chief Complaint  Patient presents with  . Eye Pain    HPI Gina Bond is a 82 y.o. female who presents to the Emergency Department complaining of moderate right eye pain onset this morning. Pt also notes that she has noticed redness and swelling to her lacrimal duct for the past week. She describes her pain as scratchy and itchy. Pt reports that she tried eye drops, but this exacerbated her symptoms. She denies any vision changes, HA. Pt does not wear contacts, but wears glasses. Pt has h/o bilateral cataract removal 10 years ago and left detached retina after this surgery.   The history is provided by the patient. No language interpreter was used.    Past Medical History:  Diagnosis Date  . COPD (chronic obstructive pulmonary disease) ([place])   . Hyperlipidemia   . Hypertension   . Pneumonia, organism unspecified   . Renal disorder   . Renal insufficiency     Patient Active Problem List   Diagnosis Date Noted  . Hypoxia   . Enteritis 12/06/2015  . Dehydration 12/06/2015  . CAD (coronary artery disease) 12/06/2015  . SBO (small bowel obstruction) ([place])   . Renal failure (ARF), acute on chronic (HCC)   . Back pain   . Dyspnea   . Small bowel obstruction ([place])   . Partial small bowel obstruction ([place]) 07/24/2015  . Ileitis 07/24/2015  . CKD (chronic kidney disease) stage 3, GFR 30-59 ml/min 07/24/2015  . Essential hypertension 12/10/2014  . COPD exacerbation ([place]) 12/10/2014  . Unstable angina ([place]) 12/09/2014  . COPD GOLD II  09/13/2013    Past Surgical History:  Procedure Laterality Date  . APPENDECTOMY    .  CHOLECYSTECTOMY    . NERVE ABLASION    . RETINAL DETACHMENT SURGERY    . TONSILLECTOMY      OB History    No data available       Home Medications    Prior to Admission medications   Medication Sig Start Date End Date Taking? Authorizing Provider  acetaminophen (TYLENOL) 500 MG tablet Take 1 tablet (500 mg total) by mouth every 6 (six) hours as needed. Patient taking differently: Take 500 mg by mouth daily as needed for mild pain.  06/13/14   Ernestina Patches, MD  aspirin 81 MG tablet Take 81 mg by mouth at bedtime.     Historical Provider, MD  Cholecalciferol (VITAMIN D3) 5000 UNITS TABS Take 1 tablet by mouth daily.    Historical Provider, MD  ciprofloxacin (CIPRO) 500 MG tablet Take 1 tablet (500 mg total) by mouth 2 (two) times daily. 12/09/15   Kelvin Cellar, MD  docusate sodium (COLACE) 100 MG capsule Take 1 capsule (100 mg total) by mouth 2 (two) times daily. 07/29/15   Eugenie Filler, MD  ezetimibe (ZETIA) 10 MG tablet Take 10 mg by mouth daily.    Historical Provider, MD  fenofibrate micronized (LOFIBRA) 134 MG capsule Take 134 mg by mouth daily.     Historical Provider, MD  fluvastatin (LESCOL) 40 MG capsule Take 40 mg by mouth at bedtime.  07/15/15   Historical Provider, MD  hydrALAZINE (APRESOLINE)  25 MG tablet Take 50 mg by mouth 3 (three) times daily.     Historical Provider, MD  HYDROcodone-acetaminophen (NORCO) 5-325 MG tablet Take 1-2 tablets by mouth every 4 (four) hours as needed for severe pain. 12/06/15   Sherwood Gambler, MD  isosorbide mononitrate (IMDUR) 120 MG 24 hr tablet Take 120 mg by mouth daily.  09/19/13   Historical Provider, MD  levalbuterol Penne Lash HFA) 45 MCG/ACT inhaler Inhale 2 puffs into the lungs every 4 (four) hours as needed for wheezing or shortness of breath. Patient not taking: Reported on 12/06/2015 12/10/14   Delfina Redwood, MD  metroNIDAZOLE (FLAGYL) 500 MG tablet Take 1 tablet (500 mg total) by mouth 3 (three) times daily. 12/09/15   Kelvin Cellar, MD  Multiple Vitamins-Minerals (CENTRUM SILVER PO) Take 1 tablet by mouth daily.    Historical Provider, MD  nebivolol (BYSTOLIC) 5 MG tablet Take 5 mg by mouth daily as needed (if heart rate >150).    Historical Provider, MD  olmesartan-hydrochlorothiazide (BENICAR HCT) 20-12.5 MG tablet Take 0.5 tablets by mouth daily. 07/09/13   Historical Provider, MD  omeprazole (PRILOSEC) 40 MG capsule Take 40 mg by mouth daily. 01/02/15   Historical Provider, MD  ondansetron (ZOFRAN ODT) 4 MG disintegrating tablet 4mg  ODT q4 hours prn nausea/vomit Patient taking differently: Take 4 mg by mouth every 4 (four) hours as needed for nausea or vomiting. 4mg  ODT q4 hours prn nausea/vomit 12/06/15   Sherwood Gambler, MD  polyethylene glycol (MIRALAX / GLYCOLAX) packet Take 17 g by mouth daily. 07/29/15   Eugenie Filler, MD  verapamil (VERELAN PM) 240 MG 24 hr capsule Take 240 mg by mouth 2 (two) times daily.     Historical Provider, MD    Family History Family History  Problem Relation Age of Onset  . Emphysema Mother     smoked  . Stroke Mother   . Emphysema Sister     smoked  . Heart disease Father   . Lung cancer Brother     smoked a pipe- with mets to bone    Social History Social History  Substance Use Topics  . Smoking status: Former Smoker    Packs/day: 2.00    Years: 57.00    Types: Cigarettes    Quit date: 02/02/2010  . Smokeless tobacco: Never Used  . Alcohol use No     Allergies   Benicar [olmesartan]; Fenofibrate; Sulfa antibiotics; Sulfacetamide sodium; Verapamil; Atorvastatin; Magnesium; Rosuvastatin calcium; Statins; Other; Sulfamethoxazole; and Tobramycin   Review of Systems Review of Systems A complete 10 system review of systems was obtained and all systems are negative except as noted in the HPI and PMH.    Physical Exam Updated Vital Signs BP 136/71 (BP Location: Left Arm)   Pulse (!) 56   Temp 98.1 F (36.7 C) (Oral)   Resp 18   Ht 5\' 6"  (1.676 m)   Wt 154 lb  (69.9 kg)   SpO2 94%   BMI 24.86 kg/m   Physical Exam  Constitutional: She appears well-developed and well-nourished.  HENT:  Head: Normocephalic.  Eyes: Conjunctivae and EOM are normal.  Slit lamp exam:      The right eye shows corneal abrasion.    Redness along lower lid and central area of mild swelling to the lower lid. No periorbital erythema.   Cardiovascular: Normal rate.   Pulmonary/Chest: Effort normal. No respiratory distress.  Abdominal: She exhibits no distension.  Musculoskeletal: Normal range of motion.  Neurological: She  is alert.  Skin: Skin is warm and dry.  Psychiatric: She has a normal mood and affect. Her behavior is normal.  Nursing note and vitals reviewed.    ED Treatments / Results  Labs (all labs ordered are listed, but only abnormal results are displayed) Labs Reviewed - No data to display  EKG  EKG Interpretation None       Radiology No results found.  Procedures Procedures (including critical care time)  DIAGNOSTIC STUDIES: Oxygen Saturation is 94% on RA, adequate by my interpretation.    COORDINATION OF CARE: 7:04 PM Discussed treatment plan with pt at bedside which includes antibiotics, ophthalmology f/u and pt agreed to plan.   Medications Ordered in ED Medications - No data to display   Initial Impression / Assessment and Plan / ED Course  I have reviewed the triage vital signs and the nursing notes.  Pertinent labs & imaging results that were available during my care of the patient were reviewed by me and considered in my medical decision making (see chart for details).  Clinical Course    Patient will be treated for the irritation of her lower lid along with a corneal abrasion.  Advised the patient to follow-up with her ophthalmologist.  Patient agrees the plan and all questions were answered  Final Clinical Impressions(s) / ED Diagnoses   Final diagnoses:  None    New Prescriptions New Prescriptions   No  medications on file      Dalia Heading, PA-C 07/30/16 2019    Sharlett Iles, MD 08/01/16 [phone]

## 2016-07-30 NOTE — Discharge Instructions (Signed)
Follow-up your ophthalmologist.  Return here for any worsening in your condition

## 2016-08-02 ENCOUNTER — Ambulatory Visit: Payer: Medicare Other | Admitting: Physical Therapy

## 2016-08-04 ENCOUNTER — Encounter: Payer: Self-pay | Admitting: Physical Therapy

## 2016-08-04 ENCOUNTER — Ambulatory Visit: Payer: Medicare Other | Admitting: Physical Therapy

## 2016-08-04 DIAGNOSIS — M25551 Pain in right hip: Secondary | ICD-10-CM | POA: Diagnosis not present

## 2016-08-04 DIAGNOSIS — R262 Difficulty in walking, not elsewhere classified: Secondary | ICD-10-CM

## 2016-08-04 DIAGNOSIS — R252 Cramp and spasm: Secondary | ICD-10-CM

## 2016-08-04 NOTE — Therapy (Signed)
[place], [place], [address] Phone: [phone]   Fax:  [phone]  Physical Therapy Treatment  Patient Details  Name: Gina Bond MRN: [id number] Date of Birth: 29-Jan-1932 Referring Provider: Paralee Cancel  Encounter Date: 08/04/2016      PT End of Session - 08/04/16 1647    Visit Number 12   Date for PT Re-Evaluation 08/22/16   PT Start Time 1558   PT Stop Time 1647   PT Time Calculation (min) 49 min   Activity Tolerance Patient tolerated treatment well   Behavior During Therapy [facility] for tasks assessed/performed      Past Medical History:  Diagnosis Date  . COPD (chronic obstructive pulmonary disease) ([place])   . Hyperlipidemia   . Hypertension   . Pneumonia, organism unspecified   . Renal disorder   . Renal insufficiency     Past Surgical History:  Procedure Laterality Date  . APPENDECTOMY    . CHOLECYSTECTOMY    . NERVE ABLASION    . RETINAL DETACHMENT SURGERY    . TONSILLECTOMY      There were no vitals filed for this visit.      Subjective Assessment - 08/04/16 1601    Subjective "Well I vacuumed, I don't know if I can do my exercises but I vacuumed." Pt reports that she is having trouble with her eyes   Currently in Pain? Yes   Pain Score 4    Pain Location --  R eye 4/10; 5/10                         [facility] Adult PT Treatment/Exercise - 08/04/16 0001      Knee/Hip Exercises: Aerobic   Nustep L5 x7 min      Knee/Hip Exercises: Standing   Other Standing Knee Exercises Standing march 3lb 2x10      Knee/Hip Exercises: Supine   Bridges with Ball Squeeze Both;2 sets;10 reps   Other Supine Knee/Hip Exercises Clam blue Tband 2x15    Other Supine Knee/Hip Exercises Bridges 2x15                   PT Short Term Goals - 06/21/16 1646      PT SHORT TERM GOAL #1   Title independent with intial HEP   Time 2   Period Weeks   Status New           PT Long Term Goals - 07/28/16 1452      PT LONG TERM GOAL #1   Title decrease pain 50%   Status Achieved     PT LONG TERM GOAL #2   Title increase lumbar ROM 25%   Status Achieved     PT LONG TERM GOAL #3   Title reports charlie horses at night decreased 50%   Status Partially Met     PT LONG TERM GOAL #4   Title vacuum house without difficulty   Baseline Reports soreness the next day   Status Partially Met               Plan - 08/04/16 1647    Clinical Impression Statement Pt reports that she vacuumed earlier and her R lateral thigh is sore. Pt has no issues completing today's exercises. She demos good strength with supine interventions but does fatigue easily. She stated that she was suppose to receive an injection in her R hip today but put  if off a month.   Rehab Potential Good   PT Frequency 2x / week   PT Duration 8 weeks   PT Treatment/Interventions Moist Heat;Iontophoresis 80m/ml Dexamethasone;Electrical Stimulation;Cryotherapy;ADLs/Self Care Home Management;Ultrasound;Gait training;Stair training;Functional mobility training;Therapeutic activities;Therapeutic exercise;Manual techniques;Patient/family education   PT Next Visit Plan ionto R hip      Patient will benefit from skilled therapeutic intervention in order to improve the following deficits and impairments:  Abnormal gait, Cardiopulmonary status limiting activity, Decreased activity tolerance, Decreased mobility, Decreased range of motion, Difficulty walking, Decreased strength, Impaired flexibility, Pain, Increased muscle spasms  Visit Diagnosis: Difficulty in walking, not elsewhere classified  Cramp and spasm  Pain in right hip     Problem List Patient Active Problem List   Diagnosis Date Noted  . Hypoxia   . Enteritis 12/06/2015  . Dehydration 12/06/2015  . CAD (coronary artery disease) 12/06/2015  . SBO (small bowel obstruction) ([place]   . Renal failure (ARF), acute on chronic  (HCC)   . Back pain   . Dyspnea   . Small bowel obstruction ([place]   . Partial small bowel obstruction ([facility] 07/24/2015  . Ileitis 07/24/2015  . CKD (chronic kidney disease) stage 3, GFR 30-59 ml/min 07/24/2015  . Essential hypertension 12/10/2014  . COPD exacerbation ([place] 12/10/2014  . Unstable angina ([place] 12/09/2014  . COPD GOLD II  09/13/2013    RScot Jun PTA  08/04/2016, 4:51 PM  [address] [address]Phone: [phone]  Fax:  [phone] Name: Gina ShackettMRN: [id number] of Birth: [birth year]

## 2016-08-11 ENCOUNTER — Ambulatory Visit: Payer: Medicare Other | Attending: Orthopedic Surgery | Admitting: Physical Therapy

## 2016-08-11 ENCOUNTER — Encounter: Payer: Self-pay | Admitting: Physical Therapy

## 2016-08-11 DIAGNOSIS — R262 Difficulty in walking, not elsewhere classified: Secondary | ICD-10-CM

## 2016-08-11 DIAGNOSIS — M62831 Muscle spasm of calf: Secondary | ICD-10-CM | POA: Diagnosis present

## 2016-08-11 DIAGNOSIS — R252 Cramp and spasm: Secondary | ICD-10-CM | POA: Insufficient documentation

## 2016-08-11 DIAGNOSIS — M25551 Pain in right hip: Secondary | ICD-10-CM | POA: Diagnosis present

## 2016-08-11 NOTE — Therapy (Signed)
[place] [address] [place], [place], [address] Phone: [phone]   Fax:  [phone]  Physical Therapy Treatment  Patient Details  Name: Gina Bond MRN: [id number] Date of Birth: [birth year] Referring Provider: Paralee Bond  Encounter Date: 08/11/2016      PT End of Session - 08/11/16 1141    Visit Number 13   Date for PT Re-Evaluation 08/22/16   PT Start Time 1100   PT Stop Time 1144   PT Time Calculation (min) 44 min   Activity Tolerance Patient tolerated treatment well   Behavior During Therapy [facility] for tasks assessed/performed      Past Medical History:  Diagnosis Date  . COPD (chronic obstructive pulmonary disease) ([place])   . Hyperlipidemia   . Hypertension   . Pneumonia, organism unspecified   . Renal disorder   . Renal insufficiency     Past Surgical History:  Procedure Laterality Date  . APPENDECTOMY    . CHOLECYSTECTOMY    . NERVE ABLASION    . RETINAL DETACHMENT SURGERY    . TONSILLECTOMY      There were no vitals filed for this visit.      Subjective Assessment - 08/11/16 1102    Subjective "Im doing fine, This hip is bothering me I think its cause of the weather"   Currently in Pain? Yes   Pain Score 4    Pain Location --  Hip and Legs   Pain Orientation Right            [facility] PT Assessment - 08/11/16 0001      AROM   Overall AROM Comments AROM of the lumbar WFL, hips WFL's                     [facility] Adult PT Treatment/Exercise - 08/11/16 0001      Lumbar Exercises: Machines for Strengthening   Cybex Knee Extension 10lb 2x10   Cybex Knee Flexion 20lb 2x10   Leg Press 30lb 3x10     Lumbar Exercises: Supine   Other Supine Lumbar Exercises Seated trunk flex with green Tband 3x10      Knee/Hip Exercises: Aerobic   Nustep L5 x7 min      Knee/Hip Exercises: Standing   Hip Abduction 2 sets;Both;10 reps   Abduction Limitations 3lb   Hip Extension 2  sets;10 reps;Both   Extension Limitations 3lb   Lateral Step Up Both;1 set;10 reps     Knee/Hip Exercises: Seated   Sit to Sand 2 sets;15 reps;without UE support  holding yellow ball                   PT Short Term Goals - 06/21/16 1646      PT SHORT TERM GOAL #1   Title independent with intial HEP   Time 2   Period Weeks   Status New           PT Long Term Goals - 08/11/16 1105      PT LONG TERM GOAL #1   Title decrease pain 50%   Status Achieved     PT LONG TERM GOAL #2   Title increase lumbar ROM 25%   Status Achieved     PT LONG TERM GOAL #3   Title reports charlie horses at night decreased 50%   Status Partially Met     PT LONG TERM GOAL #4   Title vacuum house without difficulty  Status Partially Met               Plan - 08/11/16 1142    Clinical Impression Statement Pt able to complete allof today's exercises. Reports some knee pain with seated leg extensions. Cues provided to maintain proper form with standing hip extensions.    Rehab Potential Good   PT Duration 8 weeks   PT Treatment/Interventions Moist Heat;Iontophoresis 44m/ml Dexamethasone;Electrical Stimulation;Cryotherapy;ADLs/Self Care Home Management;Ultrasound;Gait training;Stair training;Functional mobility training;Therapeutic activities;Therapeutic exercise;Manual techniques;Patient/family education   PT Next Visit Plan D/C next visit      Patient will benefit from skilled therapeutic intervention in order to improve the following deficits and impairments:  Abnormal gait, Cardiopulmonary status limiting activity, Decreased activity tolerance, Decreased mobility, Decreased range of motion, Difficulty walking, Decreased strength, Impaired flexibility, Pain, Increased muscle spasms  Visit Diagnosis: Difficulty in walking, not elsewhere classified  Pain in right hip  Muscle spasm of calf  Cramp and spasm     Problem List Patient Active Problem List   Diagnosis Date  Noted  . Hypoxia   . Enteritis 12/06/2015  . Dehydration 12/06/2015  . CAD (coronary artery disease) 12/06/2015  . SBO (small bowel obstruction) ([place]   . Renal failure (ARF), acute on chronic (HCC)   . Back pain   . Dyspnea   . Small bowel obstruction ([place]   . Partial small bowel obstruction ([place] 07/24/2015  . Ileitis 07/24/2015  . CKD (chronic kidney disease) stage 3, GFR 30-59 ml/min 07/24/2015  . Essential hypertension 12/10/2014  . COPD exacerbation ([facility] 12/10/2014  . Unstable angina ([place] 12/09/2014  . COPD GOLD II  09/13/2013    RScot Jun PTA  08/11/2016, 11:45 AM  [address] [address]Phone: [phone]  Fax:  [phone] Name: Gina HoltonMRN: [id number] of Birth: [birth year]

## 2016-08-17 ENCOUNTER — Encounter: Payer: Self-pay | Admitting: Physical Therapy

## 2016-08-17 ENCOUNTER — Ambulatory Visit: Payer: Medicare Other | Admitting: Physical Therapy

## 2016-08-17 DIAGNOSIS — M25551 Pain in right hip: Secondary | ICD-10-CM

## 2016-08-17 DIAGNOSIS — R262 Difficulty in walking, not elsewhere classified: Secondary | ICD-10-CM | POA: Diagnosis not present

## 2016-08-17 DIAGNOSIS — M62831 Muscle spasm of calf: Secondary | ICD-10-CM

## 2016-08-17 NOTE — Therapy (Signed)
[place] [address] [place], [place], [address] Phone: [phone]   Fax:  [phone]  Physical Therapy Treatment  Patient Details  Name: Gina Bond MRN: [id number] Date of Birth: 1932/04/04 Referring Provider: Paralee Cancel  Encounter Date: 08/17/2016      PT End of Session - 08/17/16 1224    Visit Number 14   Date for PT Re-Evaluation 08/22/16   PT Start Time 1140   PT Stop Time 1224   PT Time Calculation (min) 44 min   Activity Tolerance Patient tolerated treatment well   Behavior During Therapy [facility] for tasks assessed/performed      Past Medical History:  Diagnosis Date  . COPD (chronic obstructive pulmonary disease) ([place])   . Hyperlipidemia   . Hypertension   . Pneumonia, organism unspecified   . Renal disorder   . Renal insufficiency     Past Surgical History:  Procedure Laterality Date  . APPENDECTOMY    . CHOLECYSTECTOMY    . NERVE ABLASION    . RETINAL DETACHMENT SURGERY    . TONSILLECTOMY      There were no vitals filed for this visit.      Subjective Assessment - 08/17/16 1144    Subjective "Im fine, Im not any worst"   Currently in Pain? Yes   Pain Score 3    Pain Location Hip   Pain Orientation Posterior;Right                         [facility] Adult PT Treatment/Exercise - 08/17/16 0001      High Level Balance   High Level Balance Activities Side stepping;Tandem walking   High Level Balance Comments Single leg stance 10 sec each      Lumbar Exercises: Aerobic   Elliptical I10 R3 x2 minutes     Lumbar Exercises: Machines for Strengthening   Leg Press 30lb 3x10   Other Lumbar Machine Exercise       Knee/Hip Exercises: Standing   Hip Abduction 2 sets;Both;10 reps   Abduction Limitations 3lb   Hip Extension 2 sets;10 reps;Both   Extension Limitations 3lb   Lateral Step Up Both;10 reps;2 sets  3lb cuff weight around LE                   PT  Short Term Goals - 06/21/16 1646      PT SHORT TERM GOAL #1   Title independent with intial HEP   Time 2   Period Weeks   Status New           PT Long Term Goals - 08/17/16 1229      PT LONG TERM GOAL #1   Title decrease pain 50%   Status Achieved     PT LONG TERM GOAL #2   Title increase lumbar ROM 25%   Status Achieved     PT LONG TERM GOAL #3   Title reports charlie horses at night decreased 50%   Status Partially Met     PT LONG TERM GOAL #4   Title vacuum house without difficulty   Status Partially Met               Plan - 08/17/16 1224    Clinical Impression Statement Pt again completed all exercises well in PT. Pt reports no increase in pain during today's interventions. Pt appears to reach a therapeutic plateau. Pt continues to reports that vacuuming  bothers her R hip.    Rehab Potential Good   Clinical Impairments Affecting Rehab Potential --   PT Frequency 2x / week   PT Duration 8 weeks   PT Treatment/Interventions Moist Heat;Iontophoresis 19m/ml Dexamethasone;Electrical Stimulation;Cryotherapy;ADLs/Self Care Home Management;Ultrasound;Gait training;Stair training;Functional mobility training;Therapeutic activities;Therapeutic exercise;Manual techniques;Patient/family education   PT Next Visit Plan Spoke with lead PT.Will place pt on 2 week hold. Pt will call back to schedule more appointments if she notices a decline in function. Will D/C if pt has not called in 2 weeks.      Patient will benefit from skilled therapeutic intervention in order to improve the following deficits and impairments:  Abnormal gait, Cardiopulmonary status limiting activity, Decreased activity tolerance, Decreased mobility, Decreased range of motion, Difficulty walking, Decreased strength, Impaired flexibility, Pain, Increased muscle spasms  Visit Diagnosis: Difficulty in walking, not elsewhere classified  Muscle spasm of calf  Pain in right hip     Problem  List Patient Active Problem List   Diagnosis Date Noted  . Hypoxia   . Enteritis 12/06/2015  . Dehydration 12/06/2015  . CAD (coronary artery disease) 12/06/2015  . SBO (small bowel obstruction) ([place]   . Renal failure (ARF), acute on chronic (HCC)   . Back pain   . Dyspnea   . Small bowel obstruction ([place]   . Partial small bowel obstruction ([facility] 07/24/2015  . Ileitis 07/24/2015  . CKD (chronic kidney disease) stage 3, GFR 30-59 ml/min 07/24/2015  . Essential hypertension 12/10/2014  . COPD exacerbation ([place] 12/10/2014  . Unstable angina ([place] 12/09/2014  . COPD GOLD II  09/13/2013    RScot Jun9/13/2017, 12:32 PM  [address] [address]Phone: [phone]  Fax:  [phone] Name: Gina RawlinsonMRN: [id number] of Birth: [birth year]

## 2016-10-11 ENCOUNTER — Emergency Department (HOSPITAL_BASED_OUTPATIENT_CLINIC_OR_DEPARTMENT_OTHER)
Admission: EM | Admit: 2016-10-11 | Discharge: 2016-10-12 | Disposition: A | Discharge status: Home / Self Care | Payer: Medicare Other | Attending: Family Medicine | Admitting: Family Medicine

## 2016-10-11 ENCOUNTER — Encounter (HOSPITAL_BASED_OUTPATIENT_CLINIC_OR_DEPARTMENT_OTHER): Payer: Self-pay | Admitting: *Deleted

## 2016-10-11 ENCOUNTER — Emergency Department (HOSPITAL_BASED_OUTPATIENT_CLINIC_OR_DEPARTMENT_OTHER): Payer: Medicare Other

## 2016-10-11 DIAGNOSIS — Z87891 Personal history of nicotine dependence: Secondary | ICD-10-CM | POA: Diagnosis not present

## 2016-10-11 DIAGNOSIS — Z7982 Long term (current) use of aspirin: Secondary | ICD-10-CM | POA: Diagnosis not present

## 2016-10-11 DIAGNOSIS — I2510 Atherosclerotic heart disease of native coronary artery without angina pectoris: Secondary | ICD-10-CM | POA: Diagnosis not present

## 2016-10-11 DIAGNOSIS — Z79899 Other long term (current) drug therapy: Secondary | ICD-10-CM | POA: Insufficient documentation

## 2016-10-11 DIAGNOSIS — J449 Chronic obstructive pulmonary disease, unspecified: Secondary | ICD-10-CM | POA: Diagnosis not present

## 2016-10-11 DIAGNOSIS — I129 Hypertensive chronic kidney disease with stage 1 through stage 4 chronic kidney disease, or unspecified chronic kidney disease: Secondary | ICD-10-CM | POA: Diagnosis not present

## 2016-10-11 DIAGNOSIS — R1084 Generalized abdominal pain: Secondary | ICD-10-CM | POA: Diagnosis present

## 2016-10-11 DIAGNOSIS — K5900 Constipation, unspecified: Secondary | ICD-10-CM

## 2016-10-11 DIAGNOSIS — N183 Chronic kidney disease, stage 3 (moderate): Secondary | ICD-10-CM | POA: Insufficient documentation

## 2016-10-11 HISTORY — DX: Accidental discharge from unspecified firearms or gun, initial encounter: W34.00XA

## 2016-10-11 HISTORY — DX: Unspecified intestinal obstruction, unspecified as to partial versus complete obstruction: K56.609

## 2016-10-11 LAB — URINE MICROSCOPIC-ADD ON
RBC / HPF: NONE SEEN RBC/hpf (ref 0–5)
WBC, UA: NONE SEEN WBC/hpf (ref 0–5)

## 2016-10-11 LAB — CBC WITH DIFFERENTIAL/PLATELET
Basophils Absolute: 0.1 10*3/uL (ref 0.0–0.1)
Basophils Relative: 1 %
Eosinophils Absolute: 0.3 10*3/uL (ref 0.0–0.7)
Eosinophils Relative: 3 %
HCT: 44.4 % (ref 36.0–46.0)
Hemoglobin: 14.7 g/dL (ref 12.0–15.0)
Lymphocytes Relative: 15 %
Lymphs Abs: 1.6 10*3/uL (ref 0.7–4.0)
MCH: 30.4 pg (ref 26.0–34.0)
MCHC: 33.1 g/dL (ref 30.0–36.0)
MCV: 91.9 fL (ref 78.0–100.0)
Monocytes Absolute: 0.2 10*3/uL (ref 0.1–1.0)
Monocytes Relative: 2 %
Neutro Abs: 8.4 10*3/uL — ABNORMAL HIGH (ref 1.7–7.7)
Neutrophils Relative %: 79 %
Platelets: 290 10*3/uL (ref 150–400)
RBC: 4.83 MIL/uL (ref 3.87–5.11)
RDW: 13.7 % (ref 11.5–15.5)
WBC: 10.6 10*3/uL — ABNORMAL HIGH (ref 4.0–10.5)

## 2016-10-11 LAB — COMPREHENSIVE METABOLIC PANEL
ALT: 24 U/L (ref 14–54)
AST: 28 U/L (ref 15–41)
Albumin: 4.6 g/dL (ref 3.5–5.0)
Alkaline Phosphatase: 28 U/L — ABNORMAL LOW (ref 38–126)
Anion gap: 10 (ref 5–15)
BUN: 19 mg/dL (ref 6–20)
CO2: 27 mmol/L (ref 22–32)
Calcium: 9.8 mg/dL (ref 8.9–10.3)
Chloride: 102 mmol/L (ref 101–111)
Creatinine, Ser: 1.24 mg/dL — ABNORMAL HIGH (ref 0.44–1.00)
GFR calc Af Amer: 46 mL/min — ABNORMAL LOW (ref >60)
GFR calc non Af Amer: 40 mL/min — ABNORMAL LOW (ref >60)
Glucose, Bld: 121 mg/dL — ABNORMAL HIGH (ref 65–99)
Potassium: 4.1 mmol/L (ref 3.5–5.1)
Sodium: 139 mmol/L (ref 135–145)
Total Bilirubin: 0.5 mg/dL (ref 0.3–1.2)
Total Protein: 7.2 g/dL (ref 6.5–8.1)

## 2016-10-11 LAB — URINALYSIS, ROUTINE W REFLEX MICROSCOPIC
Bilirubin Urine: NEGATIVE
Glucose, UA: NEGATIVE mg/dL
Hgb urine dipstick: NEGATIVE
Ketones, ur: NEGATIVE mg/dL
Leukocytes, UA: NEGATIVE
Nitrite: NEGATIVE
Protein, ur: 100 mg/dL — AB
Specific Gravity, Urine: 1.016 (ref 1.005–1.030)
pH: 6.5 (ref 5.0–8.0)

## 2016-10-11 LAB — LIPASE, BLOOD: Lipase: 46 U/L (ref 11–51)

## 2016-10-11 LAB — I-STAT CG4 LACTIC ACID, ED: Lactic Acid, Venous: 1.01 mmol/L (ref 0.5–1.9)

## 2016-10-11 MED ORDER — FENTANYL CITRATE (PF) 100 MCG/2ML IJ SOLN
25.0000 ug | Freq: Once | INTRAMUSCULAR | Status: AC
  Administered 2016-10-11: 25 ug via INTRAVENOUS
  Filled 2016-10-11: qty 2

## 2016-10-11 MED ORDER — SODIUM CHLORIDE 0.9 % IV BOLUS (SEPSIS)
1000.0000 mL | Freq: Once | INTRAVENOUS | Status: AC
  Administered 2016-10-11: 1000 mL via INTRAVENOUS

## 2016-10-11 MED ORDER — FLEET ENEMA 7-19 GM/118ML RE ENEM: 1.0000 | ENEMA | Freq: Once | RECTAL | 0 refills | Status: AC

## 2016-10-11 MED ORDER — ONDANSETRON HCL 4 MG PO TABS: 4.0000 mg | ORAL_TABLET | Freq: Three times a day (TID) | ORAL | 0 refills | Status: DC | PRN

## 2016-10-11 MED ORDER — ONDANSETRON HCL 4 MG/2ML IJ SOLN
4.0000 mg | Freq: Once | INTRAMUSCULAR | Status: AC
  Administered 2016-10-11: 4 mg via INTRAVENOUS
  Filled 2016-10-11: qty 2

## 2016-10-11 NOTE — Discharge Instructions (Signed)
You were seen in the ED tonight for abdominal pain, bloating, nausea, vomiting, and constipation. Your CT does not show any small bowel obstruction. You are safe to go home. We recommend you continue taking Miralax as much as you need to until you have a bowel movement. To get a jump start on your bowels, we prescribed you an enema for home. Take Zofran as needed for nausea and Tylenol as needed for abdominal pain. Please come back to the ED if you have worse abdominal pain or fever. Please follow up with your PCP. Take care!

## 2016-10-11 NOTE — ED Notes (Signed)
Went into room to obtain vitals, resident at bedside talking with patient.
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
## 2016-10-11 NOTE — ED Triage Notes (Signed)
Abdominal pain today. Hx of small bowel obstruction with possible adhesions.

## 2016-10-11 NOTE — ED Notes (Signed)
Patient transported to CT 

## 2017-03-17 DIAGNOSIS — R197 Diarrhea, unspecified: Secondary | ICD-10-CM | POA: Insufficient documentation

## 2017-05-24 ENCOUNTER — Encounter (HOSPITAL_BASED_OUTPATIENT_CLINIC_OR_DEPARTMENT_OTHER): Payer: Self-pay | Admitting: Emergency Medicine

## 2017-05-24 ENCOUNTER — Emergency Department (HOSPITAL_BASED_OUTPATIENT_CLINIC_OR_DEPARTMENT_OTHER)
Admission: EM | Admit: 2017-05-24 | Discharge: 2017-05-24 | Disposition: A | Discharge status: Home / Self Care | Payer: Medicare Other | Attending: Emergency Medicine | Admitting: Emergency Medicine

## 2017-05-24 ENCOUNTER — Emergency Department (HOSPITAL_BASED_OUTPATIENT_CLINIC_OR_DEPARTMENT_OTHER): Payer: Medicare Other

## 2017-05-24 DIAGNOSIS — J449 Chronic obstructive pulmonary disease, unspecified: Secondary | ICD-10-CM | POA: Insufficient documentation

## 2017-05-24 DIAGNOSIS — R1084 Generalized abdominal pain: Secondary | ICD-10-CM | POA: Diagnosis not present

## 2017-05-24 DIAGNOSIS — N183 Chronic kidney disease, stage 3 (moderate): Secondary | ICD-10-CM | POA: Insufficient documentation

## 2017-05-24 DIAGNOSIS — I129 Hypertensive chronic kidney disease with stage 1 through stage 4 chronic kidney disease, or unspecified chronic kidney disease: Secondary | ICD-10-CM | POA: Insufficient documentation

## 2017-05-24 DIAGNOSIS — Z79899 Other long term (current) drug therapy: Secondary | ICD-10-CM | POA: Insufficient documentation

## 2017-05-24 DIAGNOSIS — Z87891 Personal history of nicotine dependence: Secondary | ICD-10-CM | POA: Diagnosis not present

## 2017-05-24 DIAGNOSIS — R109 Unspecified abdominal pain: Secondary | ICD-10-CM

## 2017-05-24 LAB — CBC WITH DIFFERENTIAL/PLATELET
Basophils Absolute: 0.1 10*3/uL (ref 0.0–0.1)
Basophils Relative: 1 %
Eosinophils Absolute: 0.7 10*3/uL (ref 0.0–0.7)
Eosinophils Relative: 10 %
HCT: 41.4 % (ref 36.0–46.0)
Hemoglobin: 13.7 g/dL (ref 12.0–15.0)
Lymphocytes Relative: 29 %
Lymphs Abs: 1.9 10*3/uL (ref 0.7–4.0)
MCH: 31.4 pg (ref 26.0–34.0)
MCHC: 33.1 g/dL (ref 30.0–36.0)
MCV: 95 fL (ref 78.0–100.0)
Monocytes Absolute: 0.5 10*3/uL (ref 0.1–1.0)
Monocytes Relative: 7 %
Neutro Abs: 3.3 10*3/uL (ref 1.7–7.7)
Neutrophils Relative %: 52 %
Platelets: 271 10*3/uL (ref 150–400)
RBC: 4.36 MIL/uL (ref 3.87–5.11)
RDW: 13.2 % (ref 11.5–15.5)
WBC: 6.4 10*3/uL (ref 4.0–10.5)

## 2017-05-24 LAB — URINALYSIS, ROUTINE W REFLEX MICROSCOPIC
Bilirubin Urine: NEGATIVE
Glucose, UA: NEGATIVE mg/dL
Hgb urine dipstick: NEGATIVE
Ketones, ur: NEGATIVE mg/dL
Leukocytes, UA: NEGATIVE
Nitrite: NEGATIVE
Protein, ur: NEGATIVE mg/dL
Specific Gravity, Urine: 1.007 (ref 1.005–1.030)
pH: 6.5 (ref 5.0–8.0)

## 2017-05-24 LAB — COMPREHENSIVE METABOLIC PANEL
ALT: 20 U/L (ref 14–54)
AST: 23 U/L (ref 15–41)
Albumin: 4.2 g/dL (ref 3.5–5.0)
Alkaline Phosphatase: 26 U/L — ABNORMAL LOW (ref 38–126)
Anion gap: 8 (ref 5–15)
BUN: 21 mg/dL — ABNORMAL HIGH (ref 6–20)
CO2: 27 mmol/L (ref 22–32)
Calcium: 9.6 mg/dL (ref 8.9–10.3)
Chloride: 105 mmol/L (ref 101–111)
Creatinine, Ser: 1.53 mg/dL — ABNORMAL HIGH (ref 0.44–1.00)
GFR calc Af Amer: 35 mL/min — ABNORMAL LOW (ref >60)
GFR calc non Af Amer: 31 mL/min — ABNORMAL LOW (ref >60)
Glucose, Bld: 99 mg/dL (ref 65–99)
Potassium: 3.7 mmol/L (ref 3.5–5.1)
Sodium: 140 mmol/L (ref 135–145)
Total Bilirubin: 0.5 mg/dL (ref 0.3–1.2)
Total Protein: 6.8 g/dL (ref 6.5–8.1)

## 2017-05-24 LAB — LIPASE, BLOOD: Lipase: 78 U/L — ABNORMAL HIGH (ref 11–51)

## 2017-05-24 LAB — I-STAT CG4 LACTIC ACID, ED: Lactic Acid, Venous: 0.51 mmol/L (ref 0.5–1.9)

## 2017-05-24 MED ORDER — IOPAMIDOL (ISOVUE-300) INJECTION 61%
100.0000 mL | Freq: Once | INTRAVENOUS | Status: AC | PRN
  Administered 2017-05-24: 80 mL via INTRAVENOUS

## 2017-05-24 NOTE — Discharge Instructions (Signed)
Your blood work, urine analysis, and CT scan did not show any abnormalities to explain your pain. It is possible that your pain is muscular. Try heating pads. Tylenol. Follow up with family doctor if not improving. Return if worsening.

## 2017-05-24 NOTE — ED Notes (Signed)
Pt discharged to lobby. Her daughter is coming to pick her up. Ambulatory without difficulty

## 2017-05-24 NOTE — ED Triage Notes (Signed)
Patient has had a pain to her left upper quadrant since Saturday. Hx of bowel obstruction. Patient reports that she is also constipated. The patient has taken several medications to help with the constipation. Denies any N/V at this time
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
## 2017-06-15 DIAGNOSIS — K5909 Other constipation: Secondary | ICD-10-CM | POA: Insufficient documentation

## 2018-01-08 ENCOUNTER — Other Ambulatory Visit: Payer: Self-pay | Admitting: Gastroenterology

## 2018-01-08 DIAGNOSIS — R1032 Left lower quadrant pain: Secondary | ICD-10-CM

## 2018-01-18 ENCOUNTER — Ambulatory Visit
Admission: RE | Admit: 2018-01-18 | Discharge: 2018-01-18 | Disposition: A | Discharge status: Home / Self Care | Payer: Medicare Other | Source: Ambulatory Visit | Attending: Gastroenterology | Admitting: Gastroenterology

## 2018-01-18 DIAGNOSIS — R1032 Left lower quadrant pain: Secondary | ICD-10-CM

## 2018-06-02 ENCOUNTER — Emergency Department (HOSPITAL_BASED_OUTPATIENT_CLINIC_OR_DEPARTMENT_OTHER)
Admission: EM | Admit: 2018-06-02 | Discharge: 2018-06-02 | Disposition: A | Discharge status: Home / Self Care | Payer: Medicare Other | Attending: Emergency Medicine | Admitting: Emergency Medicine

## 2018-06-02 ENCOUNTER — Other Ambulatory Visit: Payer: Self-pay

## 2018-06-02 ENCOUNTER — Encounter (HOSPITAL_BASED_OUTPATIENT_CLINIC_OR_DEPARTMENT_OTHER): Payer: Self-pay | Admitting: Emergency Medicine

## 2018-06-02 DIAGNOSIS — H5789 Other specified disorders of eye and adnexa: Secondary | ICD-10-CM

## 2018-06-02 DIAGNOSIS — Z79899 Other long term (current) drug therapy: Secondary | ICD-10-CM | POA: Insufficient documentation

## 2018-06-02 DIAGNOSIS — N183 Chronic kidney disease, stage 3 (moderate): Secondary | ICD-10-CM | POA: Insufficient documentation

## 2018-06-02 DIAGNOSIS — I129 Hypertensive chronic kidney disease with stage 1 through stage 4 chronic kidney disease, or unspecified chronic kidney disease: Secondary | ICD-10-CM | POA: Insufficient documentation

## 2018-06-02 DIAGNOSIS — J449 Chronic obstructive pulmonary disease, unspecified: Secondary | ICD-10-CM | POA: Insufficient documentation

## 2018-06-02 DIAGNOSIS — Z7982 Long term (current) use of aspirin: Secondary | ICD-10-CM | POA: Diagnosis not present

## 2018-06-02 DIAGNOSIS — Z87891 Personal history of nicotine dependence: Secondary | ICD-10-CM | POA: Diagnosis not present

## 2018-06-02 DIAGNOSIS — I2510 Atherosclerotic heart disease of native coronary artery without angina pectoris: Secondary | ICD-10-CM | POA: Diagnosis not present

## 2018-06-02 MED ORDER — ARTIFICIAL TEARS OPHTHALMIC OINT: TOPICAL_OINTMENT | OPHTHALMIC | 0 refills | Status: DC | PRN

## 2018-06-02 MED ORDER — ERYTHROMYCIN 5 MG/GM OP OINT: TOPICAL_OINTMENT | OPHTHALMIC | 0 refills | Status: DC

## 2018-06-02 MED ORDER — TETRACAINE HCL 0.5 % OP SOLN
2.0000 [drp] | Freq: Once | OPHTHALMIC | Status: AC
  Administered 2018-06-02: 2 [drp] via OPHTHALMIC
  Filled 2018-06-02: qty 4

## 2018-06-02 MED ORDER — FLUORESCEIN SODIUM 1 MG OP STRP
1.0000 | ORAL_STRIP | Freq: Once | OPHTHALMIC | Status: AC
  Administered 2018-06-02: 1 via OPHTHALMIC
  Filled 2018-06-02: qty 1

## 2018-06-02 NOTE — ED Triage Notes (Signed)
Pt woke up this morning with R eye pain. She feels like something is in it.

## 2018-06-02 NOTE — ED Notes (Signed)
ED Provider at bedside. 
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
## 2018-06-02 NOTE — Discharge Instructions (Addendum)
Contact a health care provider if: Your symptoms get worse or do not improve with treatment. You have mild eye pain. You have sensitivity to light. You have spots or blisters on your eyes. You have pus draining from your eye. You have a fever. Get help right away if: You have redness, swelling, or other symptoms in only one eye. Your vision is blurred or you have vision changes. You have severe eye pain.

## 2018-09-04 ENCOUNTER — Encounter: Payer: Self-pay | Admitting: Vascular Surgery

## 2018-09-04 ENCOUNTER — Other Ambulatory Visit: Payer: Self-pay

## 2018-09-04 ENCOUNTER — Ambulatory Visit (INDEPENDENT_AMBULATORY_CARE_PROVIDER_SITE_OTHER): Payer: Medicare Other | Admitting: Vascular Surgery

## 2018-09-04 VITALS — BP 158/77 | HR 68 | Resp 18 | Ht 66.0 in | Wt 149.0 lb

## 2018-09-04 DIAGNOSIS — I714 Abdominal aortic aneurysm, without rupture, unspecified: Secondary | ICD-10-CM | POA: Insufficient documentation
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
## 2018-10-13 ENCOUNTER — Other Ambulatory Visit: Payer: Self-pay

## 2018-10-13 ENCOUNTER — Emergency Department (HOSPITAL_BASED_OUTPATIENT_CLINIC_OR_DEPARTMENT_OTHER)
Admission: EM | Admit: 2018-10-13 | Discharge: 2018-10-13 | Disposition: A | Discharge status: Home / Self Care | Payer: Medicare Other | Attending: Emergency Medicine | Admitting: Emergency Medicine

## 2018-10-13 ENCOUNTER — Encounter (HOSPITAL_BASED_OUTPATIENT_CLINIC_OR_DEPARTMENT_OTHER): Payer: Self-pay | Admitting: Emergency Medicine

## 2018-10-13 ENCOUNTER — Emergency Department (HOSPITAL_BASED_OUTPATIENT_CLINIC_OR_DEPARTMENT_OTHER): Payer: Medicare Other

## 2018-10-13 DIAGNOSIS — I129 Hypertensive chronic kidney disease with stage 1 through stage 4 chronic kidney disease, or unspecified chronic kidney disease: Secondary | ICD-10-CM | POA: Insufficient documentation

## 2018-10-13 DIAGNOSIS — Y929 Unspecified place or not applicable: Secondary | ICD-10-CM | POA: Insufficient documentation

## 2018-10-13 DIAGNOSIS — Z905 Acquired absence of kidney: Secondary | ICD-10-CM | POA: Insufficient documentation

## 2018-10-13 DIAGNOSIS — N183 Chronic kidney disease, stage 3 (moderate): Secondary | ICD-10-CM | POA: Insufficient documentation

## 2018-10-13 DIAGNOSIS — Z7982 Long term (current) use of aspirin: Secondary | ICD-10-CM | POA: Diagnosis not present

## 2018-10-13 DIAGNOSIS — Y999 Unspecified external cause status: Secondary | ICD-10-CM | POA: Diagnosis not present

## 2018-10-13 DIAGNOSIS — Z79899 Other long term (current) drug therapy: Secondary | ICD-10-CM | POA: Diagnosis not present

## 2018-10-13 DIAGNOSIS — I2510 Atherosclerotic heart disease of native coronary artery without angina pectoris: Secondary | ICD-10-CM | POA: Insufficient documentation

## 2018-10-13 DIAGNOSIS — I714 Abdominal aortic aneurysm, without rupture: Secondary | ICD-10-CM | POA: Insufficient documentation

## 2018-10-13 DIAGNOSIS — Y9389 Activity, other specified: Secondary | ICD-10-CM | POA: Diagnosis not present

## 2018-10-13 DIAGNOSIS — W2203XA Walked into furniture, initial encounter: Secondary | ICD-10-CM | POA: Diagnosis not present

## 2018-10-13 DIAGNOSIS — S92515A Nondisplaced fracture of proximal phalanx of left lesser toe(s), initial encounter for closed fracture: Secondary | ICD-10-CM | POA: Diagnosis not present

## 2018-10-13 DIAGNOSIS — S99822A Other specified injuries of left foot, initial encounter: Secondary | ICD-10-CM | POA: Diagnosis present

## 2018-10-13 DIAGNOSIS — J449 Chronic obstructive pulmonary disease, unspecified: Secondary | ICD-10-CM | POA: Insufficient documentation

## 2018-10-13 DIAGNOSIS — Z87891 Personal history of nicotine dependence: Secondary | ICD-10-CM | POA: Diagnosis not present
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
## 2018-10-13 NOTE — ED Notes (Signed)
ED Provider at bedside. 

## 2018-10-13 NOTE — ED Triage Notes (Signed)
Pt sent from [facility] for further eval of pain to left leg. Pt hit her left foot on a bed post a week ago. Pt has a broken toe on left foot diagnosed today at [facility]. Pt sent here to r/o DVT.

## 2018-10-13 NOTE — Discharge Instructions (Signed)
You can also try wearing a compression sock to help with swelling and elevate when you can

## 2019-01-29 ENCOUNTER — Telehealth: Payer: Self-pay

## 2019-01-29 NOTE — Telephone Encounter (Signed)
Pt's daughter called stating her mother had recent tests done and the results are being sent to you by Dr. Precious Haws. Wanted to make sure you received them. The results are in regards to an abdominal aortic aneurysm.   Please Advise.

## 2019-02-12 ENCOUNTER — Ambulatory Visit: Payer: Medicare Other

## 2019-02-15 ENCOUNTER — Other Ambulatory Visit: Payer: Self-pay

## 2019-02-15 DIAGNOSIS — I714 Abdominal aortic aneurysm, without rupture, unspecified: Secondary | ICD-10-CM

## 2019-03-12 ENCOUNTER — Ambulatory Visit: Payer: Medicare Other | Admitting: Vascular Surgery

## 2019-03-12 ENCOUNTER — Other Ambulatory Visit: Payer: Medicare Other

## 2019-04-03 ENCOUNTER — Other Ambulatory Visit: Payer: Self-pay | Admitting: Cardiology

## 2019-04-03 DIAGNOSIS — I714 Abdominal aortic aneurysm, without rupture, unspecified: Secondary | ICD-10-CM

## 2019-05-21 ENCOUNTER — Other Ambulatory Visit: Payer: Self-pay

## 2019-05-21 ENCOUNTER — Ambulatory Visit
Admission: RE | Admit: 2019-05-21 | Discharge: 2019-05-21 | Disposition: A | Discharge status: Home / Self Care | Payer: Medicare Other | Source: Ambulatory Visit | Attending: Vascular Surgery | Admitting: Vascular Surgery

## 2019-05-21 DIAGNOSIS — I714 Abdominal aortic aneurysm, without rupture, unspecified: Secondary | ICD-10-CM

## 2019-05-27 ENCOUNTER — Other Ambulatory Visit: Payer: Self-pay

## 2019-05-27 ENCOUNTER — Encounter: Payer: Self-pay | Admitting: Cardiology

## 2019-05-27 ENCOUNTER — Ambulatory Visit (INDEPENDENT_AMBULATORY_CARE_PROVIDER_SITE_OTHER): Payer: Medicare Other | Admitting: Cardiology

## 2019-05-27 VITALS — BP 173/97 | HR 67 | Ht 66.5 in | Wt 156.0 lb

## 2019-05-27 DIAGNOSIS — N183 Chronic kidney disease, stage 3 unspecified: Secondary | ICD-10-CM

## 2019-05-27 DIAGNOSIS — I2781 Cor pulmonale (chronic): Secondary | ICD-10-CM | POA: Diagnosis not present

## 2019-05-27 DIAGNOSIS — I129 Hypertensive chronic kidney disease with stage 1 through stage 4 chronic kidney disease, or unspecified chronic kidney disease: Secondary | ICD-10-CM | POA: Diagnosis not present

## 2019-05-27 DIAGNOSIS — I714 Abdominal aortic aneurysm, without rupture, unspecified: Secondary | ICD-10-CM

## 2019-05-27 DIAGNOSIS — R600 Localized edema: Secondary | ICD-10-CM

## 2019-05-27 DIAGNOSIS — I10 Essential (primary) hypertension: Secondary | ICD-10-CM
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
## 2019-05-28 ENCOUNTER — Ambulatory Visit (INDEPENDENT_AMBULATORY_CARE_PROVIDER_SITE_OTHER): Payer: Medicare Other | Admitting: Vascular Surgery

## 2019-05-28 ENCOUNTER — Other Ambulatory Visit: Payer: Self-pay

## 2019-05-28 ENCOUNTER — Encounter: Payer: Self-pay | Admitting: Vascular Surgery

## 2019-05-28 ENCOUNTER — Telehealth: Payer: Self-pay

## 2019-05-28 VITALS — Ht 67.0 in | Wt 150.0 lb

## 2019-05-28 DIAGNOSIS — I714 Abdominal aortic aneurysm, without rupture, unspecified: Secondary | ICD-10-CM
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
## 2019-05-28 NOTE — Telephone Encounter (Signed)
Pt daughter called stating her mom has had a bad night; she had cp, pain down Left arm, she was vomiting; She took tylenol about 4 am; Her daughter says that she seems ok now no cp anymore; She has nitro but she didn't take it; [phone]

## 2019-05-28 NOTE — Telephone Encounter (Signed)
[phone].   Thanks for giving me the right number :-).  Please have her come in ASAP. Spoke to daughter and patient and symptoms are concerning for angina. If no EKG change, will set up stress

## 2019-05-29 ENCOUNTER — Ambulatory Visit (INDEPENDENT_AMBULATORY_CARE_PROVIDER_SITE_OTHER): Payer: Medicare Other | Admitting: Cardiology

## 2019-05-29 VITALS — BP 170/77 | HR 76 | Ht 67.0 in | Wt 150.0 lb

## 2019-05-29 DIAGNOSIS — I209 Angina pectoris, unspecified: Secondary | ICD-10-CM

## 2019-05-29 DIAGNOSIS — I10 Essential (primary) hypertension: Secondary | ICD-10-CM

## 2019-05-29 DIAGNOSIS — R0989 Other specified symptoms and signs involving the circulatory and respiratory systems: Secondary | ICD-10-CM | POA: Diagnosis not present

## 2019-05-29 DIAGNOSIS — J449 Chronic obstructive pulmonary disease, unspecified: Secondary | ICD-10-CM

## 2019-05-29 MED ORDER — NEBIVOLOL HCL 20 MG PO TABS: 20.0000 mg | ORAL_TABLET | Freq: Every day | ORAL | 3 refills | Status: DC

## 2019-05-29 MED ORDER — NEBIVOLOL HCL 20 MG PO TABS: 10.0000 mg | ORAL_TABLET | Freq: Every day | ORAL | 3 refills | Status: DC
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
## 2019-05-30 ENCOUNTER — Telehealth: Payer: Self-pay

## 2019-05-30 NOTE — Telephone Encounter (Signed)
I had given her samples. Continue to take 10 mg (20 mg 1/2 tablet and monitor)

## 2019-05-30 NOTE — Telephone Encounter (Signed)
Pt daughter called stated that her mothers BP was running low: 8 pm 84/41 (37), 10 pm 96/48 (36), this morning 93/70 (41). Pt stated that you spoke regarding cutting Bystolic medication in half waiting on Rx to come in so she doesn't have to cut it.  Daughter stated that she wouldn't give her any more of the Bystolic until she hear from you. Please advise.

## 2019-05-30 NOTE — Telephone Encounter (Signed)
She called me this evening, BP is excellent 110/72 and c/o dyspnea unrelated to medication and will continue to monitor. Await stress test

## 2019-05-30 NOTE — Telephone Encounter (Signed)
Spoke to daughter will continue to monitor and give a report to [place] of how she does over the weekend on Monday.

## 2019-05-31 ENCOUNTER — Telehealth: Payer: Self-pay

## 2019-05-31 NOTE — Telephone Encounter (Signed)
Pt daughter called and said that her mother has SOB after taking Bystolic, she does not want her to take it. Can she stop this go back on her previous medications Metoprolol and Hydralazine

## 2019-05-31 NOTE — Telephone Encounter (Signed)
Spoke to her, she will discontinue Bystolic and resume all her prior meds

## 2019-05-31 NOTE — Telephone Encounter (Signed)
Pt's daughter has called 3 times today  Please call her daughter [phone]

## 2019-06-03 NOTE — Telephone Encounter (Signed)
error 

## 2019-06-06 NOTE — Telephone Encounter (Signed)
error 

## 2019-06-15 ENCOUNTER — Telehealth: Payer: Self-pay | Admitting: Cardiology

## 2019-06-15 NOTE — Telephone Encounter (Signed)
Patient's daughter called on 06/14/2019 evening. She is very concerned about possible drug interaction of using metoprolol, along with verapamil. Reviewed recent office visit note. It appears that she was recommended to switch from metoprolol to bystolic by Dr, Einar Gip, which she did not tolerate. Thus, it was switched back to metoprolol. Patient's daughter also has concerns about patient undergoing pharmacological stress test next week. Currently, patient does not have any ongoing symptoms.  I recommend that she hold off metoprolol for now. I will request Dr. Einar Gip to discuss with the patient/daughter on Monday to further discuss the above concerns/questions.   Nigel Mormon, MD

## 2019-06-19 ENCOUNTER — Other Ambulatory Visit: Payer: Medicare Other

## 2019-06-28 ENCOUNTER — Other Ambulatory Visit: Payer: Self-pay

## 2019-06-28 ENCOUNTER — Ambulatory Visit (INDEPENDENT_AMBULATORY_CARE_PROVIDER_SITE_OTHER): Payer: Medicare Other

## 2019-06-28 DIAGNOSIS — I209 Angina pectoris, unspecified: Secondary | ICD-10-CM

## 2019-06-28 DIAGNOSIS — I200 Unstable angina: Secondary | ICD-10-CM

## 2019-07-01 NOTE — Progress Notes (Signed)
Pt aware.

## 2019-07-04 ENCOUNTER — Other Ambulatory Visit: Payer: Self-pay

## 2019-07-04 ENCOUNTER — Ambulatory Visit (INDEPENDENT_AMBULATORY_CARE_PROVIDER_SITE_OTHER): Payer: Medicare Other

## 2019-07-04 DIAGNOSIS — R0989 Other specified symptoms and signs involving the circulatory and respiratory systems: Secondary | ICD-10-CM | POA: Diagnosis not present

## 2019-07-08 NOTE — Progress Notes (Signed)
LMOM with details

## 2019-07-19 ENCOUNTER — Encounter: Payer: Self-pay | Admitting: Cardiology

## 2019-07-22 ENCOUNTER — Other Ambulatory Visit: Payer: Self-pay

## 2019-07-22 ENCOUNTER — Encounter: Payer: Self-pay | Admitting: Cardiology

## 2019-07-22 ENCOUNTER — Ambulatory Visit (INDEPENDENT_AMBULATORY_CARE_PROVIDER_SITE_OTHER): Payer: Medicare Other | Admitting: Cardiology

## 2019-07-22 ENCOUNTER — Other Ambulatory Visit: Payer: Self-pay | Admitting: Cardiology

## 2019-07-22 VITALS — BP 140/78 | HR 69 | Temp 96.6°F | Ht 66.0 in | Wt 156.9 lb

## 2019-07-22 DIAGNOSIS — I209 Angina pectoris, unspecified: Secondary | ICD-10-CM

## 2019-07-22 DIAGNOSIS — I714 Abdominal aortic aneurysm, without rupture, unspecified: Secondary | ICD-10-CM

## 2019-07-22 DIAGNOSIS — I129 Hypertensive chronic kidney disease with stage 1 through stage 4 chronic kidney disease, or unspecified chronic kidney disease: Secondary | ICD-10-CM | POA: Diagnosis not present

## 2019-07-22 DIAGNOSIS — N183 Chronic kidney disease, stage 3 unspecified: Secondary | ICD-10-CM

## 2019-07-22 DIAGNOSIS — R600 Localized edema: Secondary | ICD-10-CM

## 2019-07-22 DIAGNOSIS — I2781 Cor pulmonale (chronic): Secondary | ICD-10-CM

## 2019-07-22 DIAGNOSIS — I10 Essential (primary) hypertension: Secondary | ICD-10-CM

## 2019-07-22 MED ORDER — FUROSEMIDE 20 MG PO TABS: 20.0000 mg | ORAL_TABLET | ORAL | 1 refills | Status: DC

## 2019-07-22 MED ORDER — FUROSEMIDE 20 MG PO TABS: 20.0000 mg | ORAL_TABLET | Freq: Every day | ORAL | 0 refills | Status: DC
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
## 2019-08-15 ENCOUNTER — Telehealth: Payer: Self-pay

## 2019-08-15 NOTE — Telephone Encounter (Signed)
Pt daughter called stating that she had pain in her chest and pain down L side of arm and wants her to be seen tomorrow before the weekend

## 2019-08-16 NOTE — Telephone Encounter (Signed)
Called pt to inform her about message below. Pt mention she only used one and that's when she first got the Rx about 6 months ago.

## 2019-08-19 NOTE — Telephone Encounter (Signed)
Spoke with daughter this morning ; She feels like it can be the aneurysm since all the heart tests have been good; She will set up appt

## 2019-08-22 ENCOUNTER — Ambulatory Visit: Payer: Medicare Other | Admitting: Cardiology

## 2019-08-23 ENCOUNTER — Other Ambulatory Visit: Payer: Self-pay

## 2019-08-23 DIAGNOSIS — R600 Localized edema: Secondary | ICD-10-CM

## 2019-08-23 DIAGNOSIS — N183 Chronic kidney disease, stage 3 unspecified: Secondary | ICD-10-CM

## 2019-08-23 MED ORDER — FUROSEMIDE 20 MG PO TABS: ORAL_TABLET | ORAL | 2 refills | Status: DC

## 2019-09-03 ENCOUNTER — Telehealth: Payer: Self-pay

## 2019-09-04 ENCOUNTER — Telehealth: Payer: Self-pay

## 2019-09-04 NOTE — Telephone Encounter (Signed)
I will certainly discuss on her OV soon [place]

## 2019-09-04 NOTE — Telephone Encounter (Signed)
Pt's daughter called and said that the patient has recently been diagnosed with Macular Degeneration. She feels that the lasix has contributed to this and she has stopped taking it. She does have an appointment with you next Monday, and her swelling has subsided.

## 2019-09-09 ENCOUNTER — Ambulatory Visit (INDEPENDENT_AMBULATORY_CARE_PROVIDER_SITE_OTHER): Payer: Medicare Other | Admitting: Cardiology

## 2019-09-09 ENCOUNTER — Other Ambulatory Visit: Payer: Self-pay

## 2019-09-09 ENCOUNTER — Encounter: Payer: Self-pay | Admitting: Cardiology

## 2019-09-09 VITALS — BP 149/82 | HR 67 | Temp 96.4°F | Ht 66.0 in | Wt 156.6 lb

## 2019-09-09 DIAGNOSIS — I10 Essential (primary) hypertension: Secondary | ICD-10-CM

## 2019-09-09 DIAGNOSIS — I714 Abdominal aortic aneurysm, without rupture, unspecified: Secondary | ICD-10-CM

## 2019-09-09 DIAGNOSIS — I129 Hypertensive chronic kidney disease with stage 1 through stage 4 chronic kidney disease, or unspecified chronic kidney disease: Secondary | ICD-10-CM

## 2019-09-09 DIAGNOSIS — R600 Localized edema: Secondary | ICD-10-CM

## 2019-09-09 DIAGNOSIS — I209 Angina pectoris, unspecified: Secondary | ICD-10-CM | POA: Diagnosis not present

## 2019-09-09 DIAGNOSIS — I2781 Cor pulmonale (chronic): Secondary | ICD-10-CM

## 2019-09-09 DIAGNOSIS — N1832 Chronic kidney disease, stage 3b: Secondary | ICD-10-CM

## 2019-09-09 MED ORDER — FUROSEMIDE 20 MG PO TABS: 20.0000 mg | ORAL_TABLET | ORAL | 1 refills | Status: DC

## 2019-09-09 MED ORDER — HYDRALAZINE HCL 50 MG PO TABS: 50.0000 mg | ORAL_TABLET | Freq: Three times a day (TID) | ORAL | 1 refills | Status: DC
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
## 2019-09-10 ENCOUNTER — Other Ambulatory Visit: Payer: Self-pay

## 2019-09-13 ENCOUNTER — Telehealth: Payer: Self-pay

## 2019-09-13 NOTE — Telephone Encounter (Signed)
Pt called to inform us that she is feeling bad when you takes the Hydralazine 25 pt is take 2 tab TID. Pt mention she can not take the dose she is take 50mg . Due that she gets leg cramps. Pt would like to go back to 2 tab BID. Please advise. Thank you

## 2019-09-13 NOTE — Telephone Encounter (Signed)
Ask her to take it 25 mg TID if able to

## 2019-09-16 NOTE — Telephone Encounter (Signed)
Called pt to inform her to take the Hydralazine 25mg  TID. Pt understood.

## 2019-10-02 ENCOUNTER — Other Ambulatory Visit: Payer: Self-pay | Admitting: Cardiology

## 2019-10-07 ENCOUNTER — Other Ambulatory Visit: Payer: Self-pay

## 2019-10-07 ENCOUNTER — Ambulatory Visit (INDEPENDENT_AMBULATORY_CARE_PROVIDER_SITE_OTHER): Payer: Medicare Other

## 2019-10-07 DIAGNOSIS — I714 Abdominal aortic aneurysm, without rupture, unspecified: Secondary | ICD-10-CM

## 2019-10-13 ENCOUNTER — Other Ambulatory Visit: Payer: Self-pay | Admitting: Cardiology

## 2019-10-13 DIAGNOSIS — I714 Abdominal aortic aneurysm, without rupture, unspecified: Secondary | ICD-10-CM

## 2019-10-23 ENCOUNTER — Ambulatory Visit: Payer: Medicare Other | Admitting: Cardiology

## 2019-10-23 NOTE — Telephone Encounter (Signed)
error 

## 2019-11-15 ENCOUNTER — Other Ambulatory Visit: Payer: Self-pay | Admitting: Vascular Surgery

## 2019-11-15 DIAGNOSIS — I714 Abdominal aortic aneurysm, without rupture, unspecified: Secondary | ICD-10-CM

## 2019-11-25 ENCOUNTER — Other Ambulatory Visit: Payer: Medicare Other

## 2019-11-26 ENCOUNTER — Other Ambulatory Visit: Payer: Medicare Other

## 2019-11-28 ENCOUNTER — Ambulatory Visit: Payer: Medicare Other | Admitting: Cardiology

## 2019-12-03 ENCOUNTER — Ambulatory Visit: Payer: Medicare Other | Admitting: Vascular Surgery

## 2019-12-04 ENCOUNTER — Ambulatory Visit
Admission: RE | Admit: 2019-12-04 | Discharge: 2019-12-04 | Disposition: A | Discharge status: Home / Self Care | Payer: Medicare Other | Source: Ambulatory Visit | Attending: Vascular Surgery | Admitting: Vascular Surgery

## 2019-12-04 DIAGNOSIS — I714 Abdominal aortic aneurysm, without rupture, unspecified: Secondary | ICD-10-CM

## 2019-12-20 ENCOUNTER — Ambulatory Visit: Payer: Medicare Other | Attending: Internal Medicine

## 2019-12-20 DIAGNOSIS — Z23 Encounter for immunization: Secondary | ICD-10-CM

## 2019-12-20 NOTE — Progress Notes (Signed)
   Covid-19 Vaccination Clinic  Name:  Gina Bond    MRN: [id number] DOB: 04-Nov-1932  12/20/2019  [name] was observed post Covid-19 immunization for 30 minutes based on pre-vaccination screening without incidence. She was provided with Vaccine Information Sheet and instruction to access the V-Safe system.   [name] was instructed to call 911 with any severe reactions post vaccine: Marland Kitchen Difficulty breathing  . Swelling of your face and throat  . A fast heartbeat  . A bad rash all over your body  . Dizziness and weakness    Immunizations Administered    Name Date Dose VIS Date Route   Pfizer COVID-19 Vaccine 12/20/2019 10:56 AM 0.3 mL 11/15/2019 Intramuscular   Manufacturer: [place]   Lot: [id number]   [place]: [id number]

## 2019-12-24 ENCOUNTER — Other Ambulatory Visit: Payer: Self-pay

## 2019-12-24 ENCOUNTER — Ambulatory Visit (INDEPENDENT_AMBULATORY_CARE_PROVIDER_SITE_OTHER): Payer: Medicare Other | Admitting: Vascular Surgery

## 2019-12-24 ENCOUNTER — Encounter: Payer: Self-pay | Admitting: Vascular Surgery

## 2019-12-24 VITALS — BP 122/73 | HR 65 | Ht 66.5 in | Wt 154.0 lb

## 2019-12-24 DIAGNOSIS — I714 Abdominal aortic aneurysm, without rupture, unspecified: Secondary | ICD-10-CM
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
## 2019-12-26 ENCOUNTER — Other Ambulatory Visit: Payer: Self-pay | Admitting: *Deleted

## 2019-12-26 DIAGNOSIS — I714 Abdominal aortic aneurysm, without rupture, unspecified: Secondary | ICD-10-CM

## 2020-01-06 ENCOUNTER — Telehealth: Payer: Self-pay

## 2020-01-06 NOTE — Telephone Encounter (Signed)
Yes it is okay to do this

## 2020-01-06 NOTE — Telephone Encounter (Signed)
Called pt daughter to inform her that it is ok for pt to start Hydralazine 50mg  today and going on forward. Pt daughter also mention that pt mail order Pravastatin is backed order and it might take 2 weeks before pt gets it.

## 2020-01-06 NOTE — Telephone Encounter (Signed)
Pt daughter called to inform us that pt did not start her Hydralazine 50mg  instead she continue with Hydralazine 25mg  TIB. Pt daughter mention her bp on 01/29 she take 25mg  TIB (Total 75mg ) 140/77 pulse 65, 01/30 25mg  TIB (Total 75 mg) 162/54 pulse 66,  Yesterday 25mg  four times  (Total 100mg ) 154/94 pulse 73, andToday 25mg  164/85 pulse 65. Pt would like to know if it is ok for her to start her Hydralazine 50mg  this afternoon seens she took a Hydralazine 25mg  this morning. please Arnoldo Hooker thank you

## 2020-01-08 ENCOUNTER — Ambulatory Visit: Payer: Medicare Other | Attending: Internal Medicine

## 2020-01-08 ENCOUNTER — Ambulatory Visit: Payer: Medicare Other

## 2020-01-08 DIAGNOSIS — Z23 Encounter for immunization: Secondary | ICD-10-CM | POA: Insufficient documentation

## 2020-01-08 NOTE — Progress Notes (Signed)
   Covid-19 Vaccination Clinic  Name:  Gina Bond    MRN: [id number] DOB: 03-Dec-1932  01/08/2020  Ms. Week was observed post Covid-19 immunization for 15 minutes without incidence. She was provided with Vaccine Information Sheet and instruction to access the V-Safe system.   [name] was instructed to call 911 with any severe reactions post vaccine: Marland Kitchen Difficulty breathing  . Swelling of your face and throat  . A fast heartbeat  . A bad rash all over your body  . Dizziness and weakness    Immunizations Administered    Name Date Dose VIS Date Route   Pfizer COVID-19 Vaccine 01/08/2020 11:26 AM 0.3 mL 11/15/2019 Intramuscular   Manufacturer: [place]   Lot: [id number]   [place]: [id number]

## 2020-03-09 ENCOUNTER — Encounter: Payer: Self-pay | Admitting: Cardiology

## 2020-03-09 ENCOUNTER — Ambulatory Visit: Payer: Medicare Other | Admitting: Cardiology

## 2020-03-09 ENCOUNTER — Other Ambulatory Visit: Payer: Self-pay

## 2020-03-09 VITALS — BP 130/67 | HR 74 | Temp 97.2°F | Resp 14 | Ht 66.5 in | Wt 160.5 lb

## 2020-03-09 DIAGNOSIS — I10 Essential (primary) hypertension: Secondary | ICD-10-CM

## 2020-03-09 DIAGNOSIS — I2781 Cor pulmonale (chronic): Secondary | ICD-10-CM

## 2020-03-09 DIAGNOSIS — I714 Abdominal aortic aneurysm, without rupture, unspecified: Secondary | ICD-10-CM

## 2020-03-09 DIAGNOSIS — N1832 Chronic kidney disease, stage 3b: Secondary | ICD-10-CM
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
## 2020-03-11 ENCOUNTER — Other Ambulatory Visit: Payer: Self-pay | Admitting: Cardiology

## 2020-03-11 DIAGNOSIS — I10 Essential (primary) hypertension: Secondary | ICD-10-CM

## 2020-03-31 ENCOUNTER — Other Ambulatory Visit: Payer: Self-pay | Admitting: Cardiology

## 2020-03-31 DIAGNOSIS — I10 Essential (primary) hypertension: Secondary | ICD-10-CM

## 2020-04-02 ENCOUNTER — Other Ambulatory Visit: Payer: Self-pay

## 2020-04-02 DIAGNOSIS — I10 Essential (primary) hypertension: Secondary | ICD-10-CM

## 2020-04-02 MED ORDER — HYDRALAZINE HCL 50 MG PO TABS
ORAL_TABLET | ORAL | 2 refills | Status: DC
Start: 2020-04-02 — End: 2020-05-12

## 2020-04-08 ENCOUNTER — Telehealth: Payer: Self-pay

## 2020-04-08 ENCOUNTER — Other Ambulatory Visit: Payer: Self-pay | Admitting: Ophthalmology

## 2020-04-08 DIAGNOSIS — R220 Localized swelling, mass and lump, head: Secondary | ICD-10-CM

## 2020-04-08 NOTE — Telephone Encounter (Signed)
Daughter called to say hydralazine had not been sent to mail order pharmacy. I called pharmacy and gave verbal order for medication. I informed daughter that medication was called into pharmacy. She verbalized understanding.

## 2020-04-23 ENCOUNTER — Ambulatory Visit
Admission: RE | Admit: 2020-04-23 | Discharge: 2020-04-23 | Disposition: A | Discharge status: Home / Self Care | Payer: Medicare Other | Source: Ambulatory Visit | Attending: Ophthalmology | Admitting: Ophthalmology

## 2020-04-23 DIAGNOSIS — R220 Localized swelling, mass and lump, head: Secondary | ICD-10-CM

## 2020-05-12 ENCOUNTER — Ambulatory Visit (INDEPENDENT_AMBULATORY_CARE_PROVIDER_SITE_OTHER): Payer: Medicare Other | Admitting: Podiatry

## 2020-05-12 ENCOUNTER — Other Ambulatory Visit: Payer: Self-pay

## 2020-05-12 ENCOUNTER — Encounter: Payer: Self-pay | Admitting: Podiatry

## 2020-05-12 DIAGNOSIS — B351 Tinea unguium: Secondary | ICD-10-CM

## 2020-05-12 DIAGNOSIS — M79676 Pain in unspecified toe(s): Secondary | ICD-10-CM
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
## 2020-06-05 ENCOUNTER — Telehealth: Payer: Self-pay

## 2020-06-05 ENCOUNTER — Other Ambulatory Visit: Payer: Self-pay

## 2020-06-05 MED ORDER — PRAVASTATIN SODIUM 20 MG PO TABS: ORAL_TABLET | ORAL | 3 refills | Status: DC

## 2020-06-05 MED ORDER — PRAVASTATIN SODIUM 40 MG PO TABS: ORAL_TABLET | ORAL | 3 refills | Status: DC

## 2020-06-05 NOTE — Telephone Encounter (Signed)
Pravastatin 20mg , take 2 tablets 40mg  every evening after dinner. This RX must be faxed or verbally called into [facility] mail service

## 2020-06-21 ENCOUNTER — Other Ambulatory Visit: Payer: Self-pay

## 2020-06-21 ENCOUNTER — Encounter (HOSPITAL_BASED_OUTPATIENT_CLINIC_OR_DEPARTMENT_OTHER): Payer: Self-pay | Admitting: Emergency Medicine

## 2020-06-21 ENCOUNTER — Emergency Department (HOSPITAL_BASED_OUTPATIENT_CLINIC_OR_DEPARTMENT_OTHER)
Admission: EM | Admit: 2020-06-21 | Discharge: 2020-06-21 | Disposition: A | Discharge status: Home / Self Care | Payer: Medicare Other | Attending: Emergency Medicine | Admitting: Emergency Medicine

## 2020-06-21 DIAGNOSIS — N183 Chronic kidney disease, stage 3 unspecified: Secondary | ICD-10-CM | POA: Insufficient documentation

## 2020-06-21 DIAGNOSIS — I129 Hypertensive chronic kidney disease with stage 1 through stage 4 chronic kidney disease, or unspecified chronic kidney disease: Secondary | ICD-10-CM | POA: Insufficient documentation

## 2020-06-21 DIAGNOSIS — I119 Hypertensive heart disease without heart failure: Secondary | ICD-10-CM | POA: Insufficient documentation

## 2020-06-21 DIAGNOSIS — I25110 Atherosclerotic heart disease of native coronary artery with unstable angina pectoris: Secondary | ICD-10-CM | POA: Insufficient documentation

## 2020-06-21 DIAGNOSIS — Z87891 Personal history of nicotine dependence: Secondary | ICD-10-CM | POA: Diagnosis not present

## 2020-06-21 DIAGNOSIS — H538 Other visual disturbances: Secondary | ICD-10-CM | POA: Diagnosis present

## 2020-06-21 DIAGNOSIS — J441 Chronic obstructive pulmonary disease with (acute) exacerbation: Secondary | ICD-10-CM | POA: Diagnosis not present

## 2020-06-21 DIAGNOSIS — Z79899 Other long term (current) drug therapy: Secondary | ICD-10-CM | POA: Diagnosis not present

## 2020-06-21 DIAGNOSIS — H5711 Ocular pain, right eye: Secondary | ICD-10-CM | POA: Diagnosis not present

## 2020-06-21 MED ORDER — TETRACAINE HCL 0.5 % OP SOLN
1.0000 [drp] | Freq: Once | OPHTHALMIC | Status: AC
  Administered 2020-06-21: 1 [drp] via OPHTHALMIC
  Filled 2020-06-21: qty 4

## 2020-06-21 MED ORDER — FLUORESCEIN SODIUM 1 MG OP STRP
1.0000 | ORAL_STRIP | Freq: Once | OPHTHALMIC | Status: AC
  Administered 2020-06-21: 1 via OPHTHALMIC
  Filled 2020-06-21: qty 1

## 2020-06-21 NOTE — ED Notes (Addendum)
[facility] on-call Ophthamologist Dr Everette Rank for consult with Dr Maryan Rued @ [phone]

## 2020-06-21 NOTE — Discharge Instructions (Signed)
Today your visual acuity and the pressure in your eye are normal.  There is no sign of retinal detachment, glaucoma or scratches to the eye.  However if you start developing floaters or loss of vision in your right eye between now in being seen in the clinic tomorrow you need to go to the [facility] to be seen by the ophthalmologist.  In the meantime continue to usually use lubricating eyedrops or purchase lubricating ointment at the pharmacy to help coat the eye.  You can also take Tylenol to help with some of the pain.

## 2020-06-21 NOTE — ED Notes (Signed)
Clicked off order, someone else completed.

## 2020-06-21 NOTE — ED Triage Notes (Signed)
Pt c/o woke up today with feeling of "grit" in her right eye.
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
## 2020-06-21 NOTE — ED Notes (Signed)
C/o pain in rt eye, states had sudden onset this am

## 2020-06-21 NOTE — ED Notes (Signed)
AVS reviewed with pt and daughter, reinforced post care information by EDP, opportunity for questions provided. Copy of AVS provided, highlighted consulting MD name and number, reminded pt and daughter to call MD in am for follow up.

## 2020-07-07 DIAGNOSIS — R220 Localized swelling, mass and lump, head: Secondary | ICD-10-CM | POA: Insufficient documentation

## 2020-07-07 DIAGNOSIS — C44311 Basal cell carcinoma of skin of nose: Secondary | ICD-10-CM | POA: Insufficient documentation

## 2020-07-20 ENCOUNTER — Other Ambulatory Visit: Payer: Self-pay | Admitting: Cardiology

## 2020-07-20 DIAGNOSIS — N1832 Chronic kidney disease, stage 3b: Secondary | ICD-10-CM

## 2020-07-20 DIAGNOSIS — I2781 Cor pulmonale (chronic): Secondary | ICD-10-CM

## 2020-07-20 DIAGNOSIS — R600 Localized edema: Secondary | ICD-10-CM

## 2020-07-22 ENCOUNTER — Telehealth: Payer: Self-pay

## 2020-07-22 ENCOUNTER — Other Ambulatory Visit: Payer: Self-pay

## 2020-07-22 DIAGNOSIS — R600 Localized edema: Secondary | ICD-10-CM

## 2020-07-22 DIAGNOSIS — I2781 Cor pulmonale (chronic): Secondary | ICD-10-CM

## 2020-07-22 DIAGNOSIS — N1832 Chronic kidney disease, stage 3b: Secondary | ICD-10-CM

## 2020-07-22 MED ORDER — FUROSEMIDE 20 MG PO TABS: 20.0000 mg | ORAL_TABLET | Freq: Every morning | ORAL | 1 refills | Status: DC

## 2020-07-28 ENCOUNTER — Other Ambulatory Visit: Payer: Self-pay

## 2020-07-28 ENCOUNTER — Telehealth: Payer: Self-pay

## 2020-07-28 DIAGNOSIS — I2781 Cor pulmonale (chronic): Secondary | ICD-10-CM

## 2020-07-28 DIAGNOSIS — N1832 Chronic kidney disease, stage 3b: Secondary | ICD-10-CM

## 2020-07-28 DIAGNOSIS — R600 Localized edema: Secondary | ICD-10-CM

## 2020-07-28 MED ORDER — FUROSEMIDE 20 MG PO TABS: 20.0000 mg | ORAL_TABLET | Freq: Every morning | ORAL | 1 refills | Status: DC

## 2020-07-28 MED ORDER — FUROSEMIDE 20 MG PO TABS: 20.0000 mg | ORAL_TABLET | Freq: Every morning | ORAL | 3 refills | Status: DC

## 2020-07-28 NOTE — Telephone Encounter (Signed)
error 

## 2020-08-03 ENCOUNTER — Other Ambulatory Visit: Payer: Self-pay | Admitting: Cardiology

## 2020-08-03 DIAGNOSIS — N1832 Chronic kidney disease, stage 3b: Secondary | ICD-10-CM

## 2020-08-03 DIAGNOSIS — I2781 Cor pulmonale (chronic): Secondary | ICD-10-CM

## 2020-08-03 DIAGNOSIS — R600 Localized edema: Secondary | ICD-10-CM

## 2020-08-04 ENCOUNTER — Other Ambulatory Visit: Payer: Self-pay

## 2020-08-04 DIAGNOSIS — R600 Localized edema: Secondary | ICD-10-CM

## 2020-08-04 DIAGNOSIS — I2781 Cor pulmonale (chronic): Secondary | ICD-10-CM

## 2020-08-04 DIAGNOSIS — N1832 Chronic kidney disease, stage 3b: Secondary | ICD-10-CM

## 2020-08-04 MED ORDER — FUROSEMIDE 20 MG PO TABS: 20.0000 mg | ORAL_TABLET | Freq: Every morning | ORAL | 1 refills | Status: DC

## 2020-08-07 ENCOUNTER — Other Ambulatory Visit: Payer: Self-pay

## 2020-08-07 ENCOUNTER — Ambulatory Visit: Payer: Medicare Other

## 2020-08-07 DIAGNOSIS — I714 Abdominal aortic aneurysm, without rupture, unspecified: Secondary | ICD-10-CM

## 2020-08-11 ENCOUNTER — Other Ambulatory Visit: Payer: Self-pay | Admitting: Cardiology

## 2020-08-11 DIAGNOSIS — I714 Abdominal aortic aneurysm, without rupture, unspecified: Secondary | ICD-10-CM

## 2020-08-21 ENCOUNTER — Other Ambulatory Visit: Payer: Self-pay

## 2020-08-21 ENCOUNTER — Ambulatory Visit (INDEPENDENT_AMBULATORY_CARE_PROVIDER_SITE_OTHER): Payer: Medicare Other | Admitting: Podiatry

## 2020-08-21 ENCOUNTER — Encounter: Payer: Self-pay | Admitting: Podiatry

## 2020-08-21 DIAGNOSIS — M79676 Pain in unspecified toe(s): Secondary | ICD-10-CM | POA: Diagnosis not present

## 2020-08-21 DIAGNOSIS — B351 Tinea unguium: Secondary | ICD-10-CM | POA: Diagnosis not present

## 2020-08-21 NOTE — Patient Instructions (Signed)
Apply triple antibiotic ointment to right great toe once daily for one week.  Ingrown Toenail An ingrown toenail occurs when the corner or sides of a toenail grow into the surrounding skin. This causes discomfort and pain. The big toe is most commonly affected, but any of the toes can be affected. If an ingrown toenail is not treated, it can become infected. What are the causes? This condition may be caused by:  Wearing shoes that are too small or tight.  An injury, such as stubbing your toe or having your toe stepped on.  Improper cutting or care of your toenails.  Having nail or foot abnormalities that were present from birth (congenital abnormalities), such as having a nail that is too big for your toe. What increases the risk? The following factors may make you more likely to develop ingrown toenails:  Age. Nails tend to get thicker with age, so ingrown nails are more common among older people.  Cutting your toenails incorrectly, such as cutting them very short or cutting them unevenly. An ingrown toenail is more likely to get infected if you have:  Diabetes.  Blood flow (circulation) problems. What are the signs or symptoms? Symptoms of an ingrown toenail may include:  Pain, soreness, or tenderness.  Redness.  Swelling.  Hardening of the skin that surrounds the toenail. Signs that an ingrown toenail may be infected include:  Fluid or pus.  Symptoms that get worse instead of better. How is this diagnosed? An ingrown toenail may be diagnosed based on your medical history, your symptoms, and a physical exam. If you have fluid or blood coming from your toenail, a sample may be collected to test for the specific type of bacteria that is causing the infection. How is this treated? Treatment depends on how severe your ingrown toenail is. You may be able to care for your toenail at home.  If you have an infection, you may be prescribed antibiotic medicines.  If you have  fluid or pus draining from your toenail, your health care provider may drain it.  If you have trouble walking, you may be given crutches to use.  If you have a severe or infected ingrown toenail, you may need a procedure to remove part or all of the nail. Follow these instructions at home: Foot care   Do not pick at your toenail or try to remove it yourself.  Soak your foot in warm, soapy water. Do this for 20 minutes, 3 times a day, or as often as told by your health care provider. This helps to keep your toe clean and keep your skin soft.  Wear shoes that fit well and are not too tight. Your health care provider may recommend that you wear open-toed shoes while you heal.  Trim your toenails regularly and carefully. Cut your toenails straight across to prevent injury to the skin at the corners of the toenail. Do not cut your nails in a curved shape.  Keep your feet clean and dry to help prevent infection. Medicines  Take over-the-counter and prescription medicines only as told by your health care provider.  If you were prescribed an antibiotic, take it as told by your health care provider. Do not stop taking the antibiotic even if you start to feel better. Activity  Return to your normal activities as told by your health care provider. Ask your health care provider what activities are safe for you.  Avoid activities that cause pain. General instructions  If your health  care provider told you to use crutches to help you move around, use them as instructed.  Keep all follow-up visits as told by your health care provider. This is important. Contact a health care provider if:  You have more redness, swelling, pain, or other symptoms that do not improve with treatment.  You have fluid, blood, or pus coming from your toenail. Get help right away if:  You have a red streak on your skin that starts at your foot and spreads up your leg.  You have a fever. Summary  An ingrown  toenail occurs when the corner or sides of a toenail grow into the surrounding skin. This causes discomfort and pain. The big toe is most commonly affected, but any of the toes can be affected.  If an ingrown toenail is not treated, it can become infected.  Fluid or pus draining from your toenail is a sign of infection. Your health care provider may need to drain it. You may be given antibiotics to treat the infection.  Trimming your toenails regularly and properly can help you prevent an ingrown toenail. This information is not intended to replace advice given to you by your health care provider. Make sure you discuss any questions you have with your health care provider. Document Revised: 03/15/2019 Document Reviewed: 08/09/2017 Elsevier Patient Education  [place].
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
## 2020-09-04 ENCOUNTER — Ambulatory Visit: Payer: Medicare Other | Admitting: Cardiology

## 2020-09-04 ENCOUNTER — Other Ambulatory Visit: Payer: Self-pay

## 2020-09-04 ENCOUNTER — Encounter: Payer: Self-pay | Admitting: Cardiology

## 2020-09-04 VITALS — BP 147/73 | HR 65 | Resp 16 | Ht 66.0 in | Wt 156.0 lb

## 2020-09-04 DIAGNOSIS — I10 Essential (primary) hypertension: Secondary | ICD-10-CM

## 2020-09-04 DIAGNOSIS — I714 Abdominal aortic aneurysm, without rupture, unspecified: Secondary | ICD-10-CM

## 2020-09-04 DIAGNOSIS — I209 Angina pectoris, unspecified: Secondary | ICD-10-CM

## 2020-09-04 DIAGNOSIS — N1832 Chronic kidney disease, stage 3b: Secondary | ICD-10-CM

## 2020-09-04 MED ORDER — ISOSORBIDE DINITRATE 30 MG PO TABS: 60.0000 mg | ORAL_TABLET | Freq: Three times a day (TID) | ORAL | 3 refills | Status: DC

## 2020-09-04 MED ORDER — HYDRALAZINE HCL 50 MG PO TABS: 50.0000 mg | ORAL_TABLET | Freq: Three times a day (TID) | ORAL | 3 refills | Status: DC

## 2020-09-04 NOTE — Patient Instructions (Signed)
Abdominal Aortic Aneurysm  An aneurysm is a bulge in one of the blood vessels that carry blood away from the heart (artery). It happens when blood pushes up against a weak or damaged place in the wall of an artery. An abdominal aortic aneurysm happens in the main artery of the body (aorta). Some aneurysms may not cause problems. If it grows, it can burst or tear, causing bleeding inside the body. This is an emergency. It needs to be treated right away. What are the causes? The exact cause of this condition is not known. What increases the risk? The following may make you more likely to get this condition:  Being a female who is 60 years of age or older.  Being white (Caucasian).  Using tobacco.  Having a family history of aneurysms.  Having the following conditions: ? Hardening of the arteries (arteriosclerosis). ? Inflammation of the walls of an artery (arteritis). ? Certain genetic conditions. ? Being very overweight (obesity). ? An infection in the wall of the aorta (infectious aortitis). ? High cholesterol. ? High blood pressure (hypertension). What are the signs or symptoms? Symptoms depend on the size of the aneurysm and how fast it is growing. Most grow slowly and do not cause any symptoms. If symptoms do occur, they may include:  Pain in the belly (abdomen), side, or back.  Feeling full after eating only small amounts of food.  Feeling a throbbing lump in the belly. Symptoms that the aneurysm has burst (ruptured) include:  Sudden, very bad pain in the belly, side, or back.  Feeling sick to your stomach (nauseous).  Throwing up (vomiting).  Feeling light-headed or passing out. How is this treated? Treatment for this condition depends on:  The size of the aneurysm.  How fast it is growing.  Your age.  Your risk of having it burst. If your aneurysm is smaller than 2 inches (5 cm), your doctor may manage it by:  Checking it often to see if it is getting bigger.  You may have an imaging test (ultrasound) to check it every 3-6 months, every year, or every few years.  Giving you medicines to: ? Control blood pressure. ? Treat pain. ? Fight infection. If your aneurysm is larger than 2 inches (5 cm), you may need surgery to fix it. Follow these instructions at home: Lifestyle  Do not use any products that have nicotine or tobacco in them. This includes cigarettes, e-cigarettes, and chewing tobacco. If you need help quitting, ask your doctor.  Get regular exercise. Ask your doctor what types of exercise are best for you. Eating and drinking  Eat a heart-healthy diet. This includes eating plenty of: ? Fresh fruits and vegetables. ? Whole grains. ? Low-fat (lean) protein. ? Low-fat dairy products.  Avoid foods that are high in saturated fat and cholesterol. These foods include red meat and some dairy products.  Do not drink alcohol if: ? Your doctor tells you not to drink. ? You are pregnant, may be pregnant, or are planning to become pregnant.  If you drink alcohol: ? Limit how much you use to:  0-1 drink a day for women.  0-2 drinks a day for men. ? Be aware of how much alcohol is in your drink. In the [place] one drink equals any of these:  One typical bottle of beer (12 oz).  One-half glass of wine (5 oz).  One shot of hard liquor (1 oz). General instructions  Take over-the-counter and prescription medicines only as   told by your doctor.  Keep your blood pressure within normal limits. Ask your doctor what your blood pressure should be.  Have your blood sugar (glucose) level and cholesterol levels checked regularly. Keep your blood sugar level and cholesterol levels within normal limits.  Avoid heavy lifting and activities that take a lot of effort. Ask your doctor what activities are safe for you.  Keep all follow-up visits as told by your doctor. This is important. ? Talk to your doctor about regular screenings to see if the  aneurysm is getting bigger. Contact a doctor if you:  Have pain in your belly, side, or back.  Have a throbbing feeling in your belly.  Have a family history of aneurysms. Get help right away if you:  Have sudden, bad pain in your belly, side, or back.  Feel sick to your stomach.  Throw up.  Have trouble pooping (constipation).  Have trouble peeing (urinating).  Feel light-headed.  Have a fast heart rate when you stand.  Have sweaty skin that is cold to the touch (clammy).  Have shortness of breath.  Have a fever. These symptoms may be an emergency. Do not wait to see if the symptoms will go away. Get medical help right away. Call your local emergency services (911 in the [place] Do not drive yourself to the hospital. Summary  An aneurysm is a bulge in one of the blood vessels that carry blood away from the heart (artery). Some aneurysms may not cause problems.  You may need to have yours checked often. If it grows, it can burst or tear. This causes bleeding inside the body. It needs to be treated right away.  Follow instructions from your doctor about healthy lifestyle changes.  Keep all follow-up visits as told by your doctor. This is important. This information is not intended to replace advice given to you by your health care provider. Make sure you discuss any questions you have with your health care provider. Document Revised: 03/11/2019 Document Reviewed: 06/30/2018 Elsevier Patient Education  2020 Elsevier Inc.  
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
## 2020-09-08 LAB — CBC
Hematocrit: 42.9 % (ref 34.0–46.6)
Hemoglobin: 14.5 g/dL (ref 11.1–15.9)
MCH: 30.9 pg (ref 26.6–33.0)
MCHC: 33.8 g/dL (ref 31.5–35.7)
MCV: 92 fL (ref 79–97)
Platelets: 240 10*3/uL (ref 150–450)
RBC: 4.69 x10E6/uL (ref 3.77–5.28)
RDW: 12.4 % (ref 11.7–15.4)
WBC: 4.2 10*3/uL (ref 3.4–10.8)

## 2020-09-08 LAB — LIPID PANEL WITH LDL/HDL RATIO
Cholesterol, Total: 154 mg/dL (ref 100–199)
HDL: 45 mg/dL (ref >39)
LDL Chol Calc (NIH): 83 mg/dL (ref 0–99)
LDL/HDL Ratio: 1.8 ratio (ref 0.0–3.2)
Triglycerides: 147 mg/dL (ref 0–149)
VLDL Cholesterol Cal: 26 mg/dL (ref 5–40)

## 2020-09-08 LAB — CMP14+EGFR
ALT: 21 IU/L (ref 0–32)
AST: 25 IU/L (ref 0–40)
Albumin/Globulin Ratio: 2.3 — ABNORMAL HIGH (ref 1.2–2.2)
Albumin: 4.5 g/dL (ref 3.6–4.6)
Alkaline Phosphatase: 36 IU/L — ABNORMAL LOW (ref 44–121)
BUN/Creatinine Ratio: 18 (ref 12–28)
BUN: 23 mg/dL (ref 8–27)
Bilirubin Total: 0.4 mg/dL (ref 0.0–1.2)
CO2: 28 mmol/L (ref 20–29)
Calcium: 10 mg/dL (ref 8.7–10.3)
Chloride: 104 mmol/L (ref 96–106)
Creatinine, Ser: 1.28 mg/dL — ABNORMAL HIGH (ref 0.57–1.00)
GFR calc Af Amer: 44 mL/min/{1.73_m2} — ABNORMAL LOW (ref >59)
GFR calc non Af Amer: 38 mL/min/{1.73_m2} — ABNORMAL LOW (ref >59)
Globulin, Total: 2 g/dL (ref 1.5–4.5)
Glucose: 85 mg/dL (ref 65–99)
Potassium: 4.2 mmol/L (ref 3.5–5.2)
Sodium: 145 mmol/L — ABNORMAL HIGH (ref 134–144)
Total Protein: 6.5 g/dL (ref 6.0–8.5)

## 2020-09-09 ENCOUNTER — Other Ambulatory Visit: Payer: Self-pay

## 2020-09-14 ENCOUNTER — Other Ambulatory Visit: Payer: Self-pay

## 2020-09-14 ENCOUNTER — Telehealth: Payer: Self-pay

## 2020-09-14 NOTE — Telephone Encounter (Signed)
Received a call from patient's daughter who is concerned about pt's medication. Daughter states that the medication Isosorbide should have been discontinued, yet still received it with medications. Daughter believes that was a change in medication type per pharmacy and is concerned due to the fact that she has voiced to her Providers and the pharmacy that this cannot be done due to possible serious health issues. Please advise. Thanks!

## 2020-09-17 ENCOUNTER — Telehealth: Payer: Self-pay

## 2020-09-17 NOTE — Telephone Encounter (Signed)
Patient daughter Girtha Hake) called, stating that you recently prescribed Isosorbide Dinitrate and the pharmacy is questioning whether or not to fill it because she has a Magnesium allergy on file. Levada Dy is asking if patient she needs to go back to the Isosorbide Mononitrate or change to something else all together. If she needs to go back to Isosorbide Mononitrate, then she will be needing a refill soon, because she only had has a few days left on that bottle, before the changes were mad. Please advise.   Daughter is requesting to respond on MyChart.

## 2020-09-18 NOTE — Telephone Encounter (Signed)
Spoke with patient daughter. She will contact pharmacy. If there are any issues, she will call me back, so I can contact the pharmacy.

## 2020-09-18 NOTE — Telephone Encounter (Signed)
Please fill. Mag allergy although true, the content is minimal and also you need Mg to live

## 2020-09-22 ENCOUNTER — Telehealth: Payer: Self-pay

## 2020-09-22 DIAGNOSIS — I10 Essential (primary) hypertension: Secondary | ICD-10-CM

## 2020-09-22 DIAGNOSIS — I209 Angina pectoris, unspecified: Secondary | ICD-10-CM

## 2020-09-22 MED ORDER — ISOSORBIDE DINITRATE 30 MG PO TABS: 60.0000 mg | ORAL_TABLET | Freq: Three times a day (TID) | ORAL | 3 refills | Status: DC

## 2020-09-22 NOTE — Telephone Encounter (Signed)
Received call from patient's daughter regarding the medication isosorbide dinitrate. Daughter was very upset with lack of communication in regards to filling this medication. Apologized for inconvenience and contacted pharmacy to refill medication. Done. Pt daughter voiced appreciation and has no further questions at this time.

## 2020-10-12 ENCOUNTER — Other Ambulatory Visit: Payer: Medicare Other

## 2020-10-12 ENCOUNTER — Telehealth: Payer: Self-pay

## 2020-10-12 ENCOUNTER — Other Ambulatory Visit: Payer: Self-pay

## 2020-10-12 ENCOUNTER — Emergency Department (HOSPITAL_BASED_OUTPATIENT_CLINIC_OR_DEPARTMENT_OTHER): Payer: Medicare Other

## 2020-10-12 ENCOUNTER — Emergency Department (HOSPITAL_BASED_OUTPATIENT_CLINIC_OR_DEPARTMENT_OTHER)
Admission: EM | Admit: 2020-10-12 | Discharge: 2020-10-12 | Disposition: A | Discharge status: Home / Self Care | Payer: Medicare Other | Attending: Emergency Medicine | Admitting: Emergency Medicine

## 2020-10-12 DIAGNOSIS — J449 Chronic obstructive pulmonary disease, unspecified: Secondary | ICD-10-CM | POA: Diagnosis not present

## 2020-10-12 DIAGNOSIS — Z87891 Personal history of nicotine dependence: Secondary | ICD-10-CM | POA: Diagnosis not present

## 2020-10-12 DIAGNOSIS — Z85828 Personal history of other malignant neoplasm of skin: Secondary | ICD-10-CM | POA: Diagnosis not present

## 2020-10-12 DIAGNOSIS — M7122 Synovial cyst of popliteal space [Baker], left knee: Secondary | ICD-10-CM

## 2020-10-12 DIAGNOSIS — M25562 Pain in left knee: Secondary | ICD-10-CM | POA: Diagnosis present

## 2020-10-12 DIAGNOSIS — N183 Chronic kidney disease, stage 3 unspecified: Secondary | ICD-10-CM | POA: Diagnosis not present

## 2020-10-12 DIAGNOSIS — I2510 Atherosclerotic heart disease of native coronary artery without angina pectoris: Secondary | ICD-10-CM | POA: Diagnosis not present

## 2020-10-12 DIAGNOSIS — I129 Hypertensive chronic kidney disease with stage 1 through stage 4 chronic kidney disease, or unspecified chronic kidney disease: Secondary | ICD-10-CM | POA: Diagnosis not present

## 2020-10-12 MED ORDER — NOREPINEPHRINE 4 MG/250ML-% IV SOLN: 2.0000 ug/min | INTRAVENOUS | Status: DC

## 2020-10-12 MED ORDER — SODIUM CHLORIDE 0.9 % IV SOLN: 250.0000 mL | INTRAVENOUS | Status: DC

## 2020-10-12 NOTE — ED Triage Notes (Signed)
C/o left calf pain x 3 days with swelling

## 2020-10-12 NOTE — Telephone Encounter (Signed)
Patients daughter called and said that her mother is having severe arm and now leg pain since starting the new BP meds you prescribed.

## 2020-10-12 NOTE — Telephone Encounter (Signed)
It would be nice to know which medication. She has been on the present meds and stable. So doubt medications. But can hold couple days and restart
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
## 2020-10-16 ENCOUNTER — Other Ambulatory Visit: Payer: Self-pay

## 2020-10-16 ENCOUNTER — Ambulatory Visit: Payer: Medicare Other | Admitting: Cardiology

## 2020-10-16 ENCOUNTER — Encounter: Payer: Self-pay | Admitting: Cardiology

## 2020-10-16 VITALS — BP 164/83 | HR 68 | Resp 16 | Ht 66.0 in | Wt 158.0 lb

## 2020-10-16 DIAGNOSIS — N1832 Chronic kidney disease, stage 3b: Secondary | ICD-10-CM

## 2020-10-16 DIAGNOSIS — I10 Essential (primary) hypertension: Secondary | ICD-10-CM

## 2020-10-16 DIAGNOSIS — I209 Angina pectoris, unspecified: Secondary | ICD-10-CM

## 2020-10-16 DIAGNOSIS — I2781 Cor pulmonale (chronic): Secondary | ICD-10-CM

## 2020-10-16 MED ORDER — ISOSORBIDE MONONITRATE ER 120 MG PO TB24: 120.0000 mg | ORAL_TABLET | Freq: Every day | ORAL | 3 refills | Status: DC

## 2020-10-16 NOTE — Patient Instructions (Signed)
Please continue to monitor your blood pressure at home.  Since he made the changes with the medication, discontinuing isosorbide dinitrate and switching you back to isosorbide mononitrate, if blood pressure is still >140/80 mmHg, let me know and he can MyChart message me.  I would like to try atenolol 25 to 50 mg daily.  You can look this up in the Internet.
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
## 2020-10-23 ENCOUNTER — Other Ambulatory Visit: Payer: Self-pay

## 2020-10-27 ENCOUNTER — Encounter: Payer: Self-pay | Admitting: Physical Therapy

## 2020-10-27 ENCOUNTER — Ambulatory Visit: Payer: Medicare Other | Attending: Family Medicine | Admitting: Physical Therapy

## 2020-10-27 ENCOUNTER — Other Ambulatory Visit: Payer: Self-pay

## 2020-10-27 DIAGNOSIS — M25562 Pain in left knee: Secondary | ICD-10-CM | POA: Diagnosis not present

## 2020-10-27 DIAGNOSIS — M25561 Pain in right knee: Secondary | ICD-10-CM | POA: Insufficient documentation

## 2020-10-27 DIAGNOSIS — R600 Localized edema: Secondary | ICD-10-CM | POA: Insufficient documentation

## 2020-10-27 DIAGNOSIS — R262 Difficulty in walking, not elsewhere classified: Secondary | ICD-10-CM | POA: Insufficient documentation

## 2020-10-27 NOTE — Patient Instructions (Signed)
Access Code: [email or web address] URL: [email or web address] Date: 10/27/2020 Prepared by: Amador Cunas  Exercises Sit to Stand - 1 x daily - 7 x weekly - 3 sets - 10 reps Standing Hip Abduction with Counter Support - 1 x daily - 7 x weekly - 3 sets - 10 reps Standing Hip Extension with Counter Support - 1 x daily - 7 x weekly - 3 sets - 10 reps Standing March with Counter Support - 1 x daily - 7 x weekly - 3 sets - 10 reps Seated Hamstring Stretch - 1 x daily - 7 x weekly - 3 sets - 2 reps - 20-30 sec hold
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
## 2020-11-03 ENCOUNTER — Ambulatory Visit: Payer: Medicare Other | Admitting: Physical Therapy

## 2020-11-03 ENCOUNTER — Encounter: Payer: Self-pay | Admitting: Physical Therapy

## 2020-11-03 ENCOUNTER — Other Ambulatory Visit: Payer: Self-pay

## 2020-11-03 DIAGNOSIS — R600 Localized edema: Secondary | ICD-10-CM

## 2020-11-03 DIAGNOSIS — M25562 Pain in left knee: Secondary | ICD-10-CM

## 2020-11-03 DIAGNOSIS — R262 Difficulty in walking, not elsewhere classified: Secondary | ICD-10-CM

## 2020-11-03 DIAGNOSIS — M25561 Pain in right knee: Secondary | ICD-10-CM
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
## 2020-11-05 ENCOUNTER — Other Ambulatory Visit: Payer: Self-pay

## 2020-11-05 ENCOUNTER — Encounter: Payer: Self-pay | Admitting: Physical Therapy

## 2020-11-05 ENCOUNTER — Ambulatory Visit: Payer: Medicare Other | Attending: Family Medicine | Admitting: Physical Therapy

## 2020-11-05 DIAGNOSIS — M25561 Pain in right knee: Secondary | ICD-10-CM | POA: Insufficient documentation

## 2020-11-05 DIAGNOSIS — R600 Localized edema: Secondary | ICD-10-CM | POA: Insufficient documentation

## 2020-11-05 DIAGNOSIS — R262 Difficulty in walking, not elsewhere classified: Secondary | ICD-10-CM

## 2020-11-05 DIAGNOSIS — M25562 Pain in left knee: Secondary | ICD-10-CM | POA: Diagnosis not present
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
## 2020-11-10 ENCOUNTER — Ambulatory Visit: Payer: Medicare Other | Admitting: Physical Therapy

## 2020-11-11 ENCOUNTER — Other Ambulatory Visit: Payer: Self-pay

## 2020-11-11 ENCOUNTER — Encounter: Payer: Self-pay | Admitting: Physical Therapy

## 2020-11-11 ENCOUNTER — Ambulatory Visit: Payer: Medicare Other | Admitting: Physical Therapy

## 2020-11-11 DIAGNOSIS — R600 Localized edema: Secondary | ICD-10-CM

## 2020-11-11 DIAGNOSIS — M25561 Pain in right knee: Secondary | ICD-10-CM

## 2020-11-11 DIAGNOSIS — M25562 Pain in left knee: Secondary | ICD-10-CM

## 2020-11-11 DIAGNOSIS — R262 Difficulty in walking, not elsewhere classified: Secondary | ICD-10-CM
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
## 2020-11-12 ENCOUNTER — Encounter: Payer: Self-pay | Admitting: Physical Therapy

## 2020-11-12 ENCOUNTER — Ambulatory Visit: Payer: Medicare Other | Admitting: Physical Therapy

## 2020-11-12 DIAGNOSIS — M25561 Pain in right knee: Secondary | ICD-10-CM

## 2020-11-12 DIAGNOSIS — M25562 Pain in left knee: Secondary | ICD-10-CM

## 2020-11-12 DIAGNOSIS — R262 Difficulty in walking, not elsewhere classified: Secondary | ICD-10-CM

## 2020-11-12 DIAGNOSIS — R600 Localized edema: Secondary | ICD-10-CM
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
## 2020-11-24 ENCOUNTER — Ambulatory Visit: Payer: Medicare Other | Admitting: Podiatry

## 2021-01-16 ENCOUNTER — Inpatient Hospital Stay (HOSPITAL_BASED_OUTPATIENT_CLINIC_OR_DEPARTMENT_OTHER)
Admission: EM | Admit: 2021-01-16 | Discharge: 2021-01-19 | DRG: 390 | Disposition: A | Discharge status: Home Health | Payer: Medicare Other | Attending: Internal Medicine | Admitting: Internal Medicine

## 2021-01-16 ENCOUNTER — Encounter (HOSPITAL_BASED_OUTPATIENT_CLINIC_OR_DEPARTMENT_OTHER): Payer: Self-pay | Admitting: Emergency Medicine

## 2021-01-16 ENCOUNTER — Emergency Department (HOSPITAL_BASED_OUTPATIENT_CLINIC_OR_DEPARTMENT_OTHER): Payer: Medicare Other

## 2021-01-16 ENCOUNTER — Other Ambulatory Visit: Payer: Self-pay

## 2021-01-16 DIAGNOSIS — I2510 Atherosclerotic heart disease of native coronary artery without angina pectoris: Secondary | ICD-10-CM | POA: Diagnosis present

## 2021-01-16 DIAGNOSIS — Z8679 Personal history of other diseases of the circulatory system: Secondary | ICD-10-CM

## 2021-01-16 DIAGNOSIS — Z882 Allergy status to sulfonamides status: Secondary | ICD-10-CM

## 2021-01-16 DIAGNOSIS — E860 Dehydration: Secondary | ICD-10-CM | POA: Diagnosis present

## 2021-01-16 DIAGNOSIS — Z79899 Other long term (current) drug therapy: Secondary | ICD-10-CM

## 2021-01-16 DIAGNOSIS — E876 Hypokalemia: Secondary | ICD-10-CM | POA: Diagnosis not present

## 2021-01-16 DIAGNOSIS — Z87891 Personal history of nicotine dependence: Secondary | ICD-10-CM | POA: Diagnosis not present

## 2021-01-16 DIAGNOSIS — Z85828 Personal history of other malignant neoplasm of skin: Secondary | ICD-10-CM

## 2021-01-16 DIAGNOSIS — Z888 Allergy status to other drugs, medicaments and biological substances status: Secondary | ICD-10-CM | POA: Diagnosis not present

## 2021-01-16 DIAGNOSIS — I129 Hypertensive chronic kidney disease with stage 1 through stage 4 chronic kidney disease, or unspecified chronic kidney disease: Secondary | ICD-10-CM | POA: Diagnosis present

## 2021-01-16 DIAGNOSIS — Z823 Family history of stroke: Secondary | ICD-10-CM | POA: Diagnosis not present

## 2021-01-16 DIAGNOSIS — E785 Hyperlipidemia, unspecified: Secondary | ICD-10-CM | POA: Diagnosis present

## 2021-01-16 DIAGNOSIS — Z801 Family history of malignant neoplasm of trachea, bronchus and lung: Secondary | ICD-10-CM | POA: Diagnosis not present

## 2021-01-16 DIAGNOSIS — Z20822 Contact with and (suspected) exposure to covid-19: Secondary | ICD-10-CM | POA: Diagnosis present

## 2021-01-16 DIAGNOSIS — I10 Essential (primary) hypertension: Secondary | ICD-10-CM | POA: Diagnosis not present

## 2021-01-16 DIAGNOSIS — I714 Abdominal aortic aneurysm, without rupture, unspecified: Secondary | ICD-10-CM | POA: Diagnosis present

## 2021-01-16 DIAGNOSIS — K56609 Unspecified intestinal obstruction, unspecified as to partial versus complete obstruction: Secondary | ICD-10-CM | POA: Diagnosis present

## 2021-01-16 DIAGNOSIS — I2781 Cor pulmonale (chronic): Secondary | ICD-10-CM

## 2021-01-16 DIAGNOSIS — J441 Chronic obstructive pulmonary disease with (acute) exacerbation: Secondary | ICD-10-CM

## 2021-01-16 DIAGNOSIS — Z8249 Family history of ischemic heart disease and other diseases of the circulatory system: Secondary | ICD-10-CM | POA: Diagnosis not present

## 2021-01-16 DIAGNOSIS — N1832 Chronic kidney disease, stage 3b: Secondary | ICD-10-CM | POA: Diagnosis present

## 2021-01-16 DIAGNOSIS — R0902 Hypoxemia: Secondary | ICD-10-CM | POA: Diagnosis not present

## 2021-01-16 DIAGNOSIS — R600 Localized edema: Secondary | ICD-10-CM

## 2021-01-16 DIAGNOSIS — J449 Chronic obstructive pulmonary disease, unspecified: Secondary | ICD-10-CM | POA: Diagnosis present

## 2021-01-16 DIAGNOSIS — Z905 Acquired absence of kidney: Secondary | ICD-10-CM

## 2021-01-16 DIAGNOSIS — Z825 Family history of asthma and other chronic lower respiratory diseases: Secondary | ICD-10-CM | POA: Diagnosis not present

## 2021-01-16 DIAGNOSIS — Z0189 Encounter for other specified special examinations: Secondary | ICD-10-CM

## 2021-01-16 DIAGNOSIS — N183 Chronic kidney disease, stage 3 unspecified: Secondary | ICD-10-CM | POA: Diagnosis present

## 2021-01-16 DIAGNOSIS — Z881 Allergy status to other antibiotic agents status: Secondary | ICD-10-CM | POA: Diagnosis not present

## 2021-01-16 LAB — LIPASE, BLOOD: Lipase: 48 U/L (ref 11–51)

## 2021-01-16 LAB — RESP PANEL BY RT-PCR (FLU A&B, COVID) ARPGX2
Influenza A by PCR: NEGATIVE
Influenza B by PCR: NEGATIVE
SARS Coronavirus 2 by RT PCR: NEGATIVE

## 2021-01-16 LAB — CBC
HCT: 50.7 % — ABNORMAL HIGH (ref 36.0–46.0)
Hemoglobin: 16.8 g/dL — ABNORMAL HIGH (ref 12.0–15.0)
MCH: 30.1 pg (ref 26.0–34.0)
MCHC: 33.1 g/dL (ref 30.0–36.0)
MCV: 90.7 fL (ref 80.0–100.0)
Platelets: 357 10*3/uL (ref 150–400)
RBC: 5.59 MIL/uL — ABNORMAL HIGH (ref 3.87–5.11)
RDW: 13.8 % (ref 11.5–15.5)
WBC: 7.9 10*3/uL (ref 4.0–10.5)
nRBC: 0 % (ref 0.0–0.2)

## 2021-01-16 LAB — COMPREHENSIVE METABOLIC PANEL
ALT: 36 U/L (ref 0–44)
AST: 45 U/L — ABNORMAL HIGH (ref 15–41)
Albumin: 4.6 g/dL (ref 3.5–5.0)
Alkaline Phosphatase: 37 U/L — ABNORMAL LOW (ref 38–126)
Anion gap: 12 (ref 5–15)
BUN: 16 mg/dL (ref 8–23)
CO2: 28 mmol/L (ref 22–32)
Calcium: 9.7 mg/dL (ref 8.9–10.3)
Chloride: 100 mmol/L (ref 98–111)
Creatinine, Ser: 1.29 mg/dL — ABNORMAL HIGH (ref 0.44–1.00)
GFR, Estimated: 40 mL/min — ABNORMAL LOW (ref >60)
Glucose, Bld: 125 mg/dL — ABNORMAL HIGH (ref 70–99)
Potassium: 3.9 mmol/L (ref 3.5–5.1)
Sodium: 140 mmol/L (ref 135–145)
Total Bilirubin: 1.2 mg/dL (ref 0.3–1.2)
Total Protein: 7.1 g/dL (ref 6.5–8.1)

## 2021-01-16 LAB — URINALYSIS, ROUTINE W REFLEX MICROSCOPIC
Glucose, UA: NEGATIVE mg/dL
Hgb urine dipstick: NEGATIVE
Ketones, ur: NEGATIVE mg/dL
Leukocytes,Ua: NEGATIVE
Nitrite: NEGATIVE
Protein, ur: >300 mg/dL — AB
Specific Gravity, Urine: 1.015 (ref 1.005–1.030)
pH: 7.5 (ref 5.0–8.0)

## 2021-01-16 LAB — URINALYSIS, MICROSCOPIC (REFLEX)

## 2021-01-16 MED ORDER — LACTATED RINGERS IV SOLN: INTRAVENOUS | Status: DC

## 2021-01-16 MED ORDER — HYDRALAZINE HCL 20 MG/ML IJ SOLN
10.0000 mg | Freq: Four times a day (QID) | INTRAMUSCULAR | Status: DC | PRN
  Administered 2021-01-18: 10 mg via INTRAVENOUS
  Filled 2021-01-16: qty 1

## 2021-01-16 MED ORDER — ENOXAPARIN SODIUM 40 MG/0.4ML ~~LOC~~ SOLN
40.0000 mg | Freq: Every day | SUBCUTANEOUS | Status: DC
  Administered 2021-01-17: 40 mg via SUBCUTANEOUS
  Filled 2021-01-16: qty 0.4

## 2021-01-16 MED ORDER — HYDROMORPHONE HCL 1 MG/ML IJ SOLN
0.5000 mg | INTRAMUSCULAR | Status: DC | PRN
  Administered 2021-01-16: 0.5 mg via INTRAVENOUS
  Filled 2021-01-16: qty 1

## 2021-01-16 MED ORDER — ACETAMINOPHEN 650 MG RE SUPP: 650.0000 mg | Freq: Four times a day (QID) | RECTAL | Status: DC | PRN

## 2021-01-16 MED ORDER — ONDANSETRON HCL 4 MG PO TABS: 4.0000 mg | ORAL_TABLET | Freq: Four times a day (QID) | ORAL | Status: DC | PRN

## 2021-01-16 MED ORDER — IOHEXOL 350 MG/ML SOLN
100.0000 mL | Freq: Once | INTRAVENOUS | Status: AC | PRN
  Administered 2021-01-16: 80 mL via INTRAVENOUS

## 2021-01-16 MED ORDER — ACETAMINOPHEN 325 MG PO TABS
650.0000 mg | ORAL_TABLET | Freq: Four times a day (QID) | ORAL | Status: DC | PRN
  Administered 2021-01-18: 650 mg via ORAL
  Filled 2021-01-16: qty 2

## 2021-01-16 MED ORDER — LABETALOL HCL 5 MG/ML IV SOLN
10.0000 mg | Freq: Four times a day (QID) | INTRAVENOUS | Status: DC | PRN
  Administered 2021-01-16: 10 mg via INTRAVENOUS
  Filled 2021-01-16: qty 4

## 2021-01-16 MED ORDER — ONDANSETRON HCL 4 MG/2ML IJ SOLN
4.0000 mg | Freq: Once | INTRAMUSCULAR | Status: AC
  Administered 2021-01-16: 4 mg via INTRAVENOUS
  Filled 2021-01-16: qty 2

## 2021-01-16 MED ORDER — ONDANSETRON HCL 4 MG/2ML IJ SOLN
4.0000 mg | Freq: Four times a day (QID) | INTRAMUSCULAR | Status: DC | PRN
  Administered 2021-01-16: 4 mg via INTRAVENOUS
  Filled 2021-01-16: qty 2

## 2021-01-16 MED ORDER — HYDROMORPHONE HCL 1 MG/ML IJ SOLN
0.5000 mg | INTRAMUSCULAR | Status: DC | PRN
  Administered 2021-01-17 – 2021-01-18 (×2): 0.5 mg via INTRAVENOUS
  Filled 2021-01-16 (×2): qty 1

## 2021-01-16 MED ORDER — MORPHINE SULFATE (PF) 4 MG/ML IV SOLN
4.0000 mg | Freq: Once | INTRAVENOUS | Status: AC
  Administered 2021-01-16: 4 mg via INTRAVENOUS
  Filled 2021-01-16: qty 1

## 2021-01-16 MED ORDER — LIDOCAINE VISCOUS HCL 2 % MT SOLN
OROMUCOSAL | Status: AC
  Filled 2021-01-16: qty 15

## 2021-01-16 MED ORDER — ONDANSETRON HCL 4 MG/2ML IJ SOLN: 4.0000 mg | Freq: Four times a day (QID) | INTRAMUSCULAR | Status: DC | PRN
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
## 2021-01-16 NOTE — ED Triage Notes (Signed)
Pt arrives pov with c/o upper abdominal pain with N/V and shob that started last night. Endorses hx of abdominal aneurysm
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
## 2021-01-16 NOTE — ED Provider Notes (Signed)
I personally evaluated the patient during the encounter and completed a history, physical, procedures, medical decision making to contribute to the overall care of the patient and decision making for the patient briefly, the patient is a 87 y.o. female with history of small bowel obstruction, hypertension who presents to the ED with abdominal pain.  Pain worse today with nausea and vomiting.  Not passing gas.  Pain diffuse.  Distended on exam.  Concern for bowel obstruction.  Does have a history of aneurysm of abdominal aorta however history and physical seems less likely dissection.  CT scan confirmed likely small bowel obstruction.  May be some underlying enteritis as well.  No fever no white count.  Doubt infectious process.  Will admit to medicine for further care.  Surgery to be made aware.   EKG Interpretation  Date/Time:  Saturday January 16 2021 13:07:28 EST Ventricular Rate:  88 PR Interval:  168 QRS Duration: 88 QT Interval:  382 QTC Calculation: 462 R Axis:   -72 Text Interpretation: Normal sinus rhythm Left axis deviation Minimal voltage criteria for LVH, may be normal variant ( Cornell product ) Possible Inferior infarct , age undetermined Anterior infarct , age undetermined Abnormal ECG Confirmed by Lennice Sites [phone]) on 01/16/2021 3:29:43 PM           Lennice Sites, DO 01/16/21 1744

## 2021-01-17 ENCOUNTER — Inpatient Hospital Stay (HOSPITAL_COMMUNITY): Payer: Medicare Other

## 2021-01-17 DIAGNOSIS — I10 Essential (primary) hypertension: Secondary | ICD-10-CM

## 2021-01-17 DIAGNOSIS — N1832 Chronic kidney disease, stage 3b: Secondary | ICD-10-CM

## 2021-01-17 DIAGNOSIS — I714 Abdominal aortic aneurysm, without rupture: Secondary | ICD-10-CM

## 2021-01-17 LAB — CBC
HCT: 45.9 % (ref 36.0–46.0)
Hemoglobin: 15.3 g/dL — ABNORMAL HIGH (ref 12.0–15.0)
MCH: 30.8 pg (ref 26.0–34.0)
MCHC: 33.3 g/dL (ref 30.0–36.0)
MCV: 92.4 fL (ref 80.0–100.0)
Platelets: 288 10*3/uL (ref 150–400)
RBC: 4.97 MIL/uL (ref 3.87–5.11)
RDW: 14 % (ref 11.5–15.5)
WBC: 5.6 10*3/uL (ref 4.0–10.5)
nRBC: 0 % (ref 0.0–0.2)

## 2021-01-17 LAB — BASIC METABOLIC PANEL
Anion gap: 13 (ref 5–15)
BUN: 19 mg/dL (ref 8–23)
CO2: 26 mmol/L (ref 22–32)
Calcium: 9.3 mg/dL (ref 8.9–10.3)
Chloride: 101 mmol/L (ref 98–111)
Creatinine, Ser: 1.51 mg/dL — ABNORMAL HIGH (ref 0.44–1.00)
GFR, Estimated: 33 mL/min — ABNORMAL LOW (ref >60)
Glucose, Bld: 100 mg/dL — ABNORMAL HIGH (ref 70–99)
Potassium: 3.6 mmol/L (ref 3.5–5.1)
Sodium: 140 mmol/L (ref 135–145)

## 2021-01-17 MED ORDER — DIATRIZOATE MEGLUMINE & SODIUM 66-10 % PO SOLN
90.0000 mL | Freq: Once | ORAL | Status: AC
Start: 2021-01-17 — End: 2021-01-17
  Administered 2021-01-17: 90 mL via NASOGASTRIC
  Filled 2021-01-17: qty 90

## 2021-01-17 MED ORDER — WHITE PETROLATUM EX OINT
TOPICAL_OINTMENT | CUTANEOUS | Status: AC
  Filled 2021-01-17: qty 28.35

## 2021-01-17 MED ORDER — ENOXAPARIN SODIUM 30 MG/0.3ML ~~LOC~~ SOLN
30.0000 mg | Freq: Every day | SUBCUTANEOUS | Status: DC
  Administered 2021-01-18 – 2021-01-19 (×2): 30 mg via SUBCUTANEOUS
  Filled 2021-01-17 (×2): qty 0.3
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
## 2021-01-17 NOTE — Plan of Care (Signed)
  Problem: Education: Goal: Knowledge of General Education information will improve Description Including pain rating scale, medication(s)/side effects and non-pharmacologic comfort measures Outcome: Progressing   

## 2021-01-17 NOTE — Progress Notes (Signed)
New Admission Note:  Arrival Method: Stretcher  Mental Orientation: AXOX4 Telemetry: Box 21 Assessment: Completed Skin: Refer to flowsheet IV: Left AC Pain: 7/10 medicated  Tubes: NG tube  Safety Measures: Safety Fall Prevention Plan discussed with patient. Admission: Completed [facility] [place] Orientation: Patient has been orientated to the room, unit and the staff. Family: None Orders have been reviewed and are being implemented. Will continue to monitor the patient. Call light has been placed within reach and bed alarm has been activated.   Milagros Loll, RN  Phone Number: [phone]

## 2021-01-18 LAB — BASIC METABOLIC PANEL
Anion gap: 13 (ref 5–15)
BUN: 14 mg/dL (ref 8–23)
CO2: 31 mmol/L (ref 22–32)
Calcium: 9.2 mg/dL (ref 8.9–10.3)
Chloride: 101 mmol/L (ref 98–111)
Creatinine, Ser: 1.19 mg/dL — ABNORMAL HIGH (ref 0.44–1.00)
GFR, Estimated: 45 mL/min — ABNORMAL LOW (ref >60)
Glucose, Bld: 84 mg/dL (ref 70–99)
Potassium: 3.4 mmol/L — ABNORMAL LOW (ref 3.5–5.1)
Sodium: 145 mmol/L (ref 135–145)

## 2021-01-18 LAB — CBC WITH DIFFERENTIAL/PLATELET
Abs Immature Granulocytes: 0.03 10*3/uL (ref 0.00–0.07)
Basophils Absolute: 0 10*3/uL (ref 0.0–0.1)
Basophils Relative: 1 %
Eosinophils Absolute: 0.1 10*3/uL (ref 0.0–0.5)
Eosinophils Relative: 2 %
HCT: 45.9 % (ref 36.0–46.0)
Hemoglobin: 14.6 g/dL (ref 12.0–15.0)
Immature Granulocytes: 1 %
Lymphocytes Relative: 28 %
Lymphs Abs: 1.3 10*3/uL (ref 0.7–4.0)
MCH: 29.6 pg (ref 26.0–34.0)
MCHC: 31.8 g/dL (ref 30.0–36.0)
MCV: 93.1 fL (ref 80.0–100.0)
Monocytes Absolute: 0.4 10*3/uL (ref 0.1–1.0)
Monocytes Relative: 9 %
Neutro Abs: 2.7 10*3/uL (ref 1.7–7.7)
Neutrophils Relative %: 59 %
Platelets: 255 10*3/uL (ref 150–400)
RBC: 4.93 MIL/uL (ref 3.87–5.11)
RDW: 13.3 % (ref 11.5–15.5)
WBC: 4.6 10*3/uL (ref 4.0–10.5)
nRBC: 0 % (ref 0.0–0.2)

## 2021-01-18 LAB — MAGNESIUM: Magnesium: 2 mg/dL (ref 1.7–2.4)

## 2021-01-18 MED ORDER — HYDRALAZINE HCL 50 MG PO TABS
50.0000 mg | ORAL_TABLET | Freq: Three times a day (TID) | ORAL | Status: DC
  Administered 2021-01-18: 50 mg via ORAL
  Filled 2021-01-18 (×2): qty 1

## 2021-01-18 MED ORDER — ISOSORBIDE MONONITRATE ER 60 MG PO TB24
120.0000 mg | ORAL_TABLET | Freq: Every day | ORAL | Status: DC
  Administered 2021-01-18: 120 mg via ORAL
  Filled 2021-01-18: qty 2

## 2021-01-18 MED ORDER — POTASSIUM CHLORIDE CRYS ER 20 MEQ PO TBCR
40.0000 meq | EXTENDED_RELEASE_TABLET | Freq: Once | ORAL | Status: AC
  Administered 2021-01-18: 40 meq via ORAL
  Filled 2021-01-18: qty 2

## 2021-01-18 MED ORDER — VERAPAMIL HCL ER 240 MG PO TBCR
240.0000 mg | EXTENDED_RELEASE_TABLET | Freq: Two times a day (BID) | ORAL | Status: DC
  Administered 2021-01-18 (×2): 240 mg via ORAL
  Filled 2021-01-18 (×3): qty 1
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
## 2021-01-18 NOTE — Plan of Care (Signed)

## 2021-01-18 NOTE — TOC Initial Note (Signed)
Transition of Care [facility]) - Initial/Assessment Note    Patient Details  Name: Gina Bond MRN: [id number] Date of Birth: 1932/02/10  Transition of Care [facility]) CM/SW Contact:    Bartholomew Crews, RN Phone Number: [phone] 01/18/2021, 4:23 PM  Clinical Narrative:                  Spoke with patient at the bedside. She stated that her daughter had just left. PTA home alone except for her dog, [name]. Discussed recommendations for outpt PT - patient stated that she had been to the one at [facility] for 3 sessions in the past and she is agreeable to go again. Has RW, WC, and quad cane at home. Discussed potential need for home oxygen - will follow and arrange if needed. TOC following for transition needs.   Expected Discharge Plan: OP Rehab Barriers to Discharge: Continued Medical Work up   Patient Goals and CMS Choice Patient states their goals for this hospitalization and ongoing recovery are:: return home CMS Medicare.gov Compare Post Acute Care list provided to:: Patient Choice offered to / list presented to : Patient  Expected Discharge Plan and Services Expected Discharge Plan: OP Rehab In-house Referral: NA Discharge Planning Services: CM Consult Post Acute Care Choice: NA Living arrangements for the past 2 months: Single Family Home                           HH Arranged: NA [place] Agency: NA        Prior Living Arrangements/Services Living arrangements for the past 2 months: Single Family Home Lives with:: Self,Pets (dog, Transport planner) Patient language and need for interpreter reviewed:: Yes Do you feel safe going back to the place where you live?: Yes      Need for Family Participation in Patient Care: Yes (Comment) Care giver support system in place?: Yes (comment) Current home services: DME (WC, RW, quad cane) Criminal Activity/Legal Involvement Pertinent to Current Situation/Hospitalization: No - Comment as needed  Activities of Daily Living Home Assistive Devices/Equipment:  Cane (specify quad or straight) ADL Screening (condition at time of admission) Patient's cognitive ability adequate to safely complete daily activities?: Yes Is the patient deaf or have difficulty hearing?: No Does the patient have difficulty seeing, even when wearing glasses/contacts?: No Does the patient have difficulty concentrating, remembering, or making decisions?: No Patient able to express need for assistance with ADLs?: No Does the patient have difficulty dressing or bathing?: No Independently performs ADLs?: Yes (appropriate for developmental age) Does the patient have difficulty walking or climbing stairs?: Yes Weakness of Legs: Both Weakness of Arms/Hands: None  Permission Sought/Granted                  Emotional Assessment Appearance:: Appears stated age Attitude/Demeanor/Rapport: Engaged Affect (typically observed): Accepting Orientation: : Oriented to Self,Oriented to  Time,Oriented to Place,Oriented to Situation Alcohol / Substance Use: Not Applicable Psych Involvement: No (comment)  Admission diagnosis:  Small bowel obstruction ([place]) [K56.609] SBO (small bowel obstruction) ([place]) [K56.609] Patient Active Problem List   Diagnosis Date Noted  . AAA (abdominal aortic aneurysm) without rupture ([place]) 09/04/2018  . Chronic constipation 06/15/2017  . Diarrhea 03/17/2017  . Right hip pain 05/26/2016  . Hypoxia   . Enteritis 12/06/2015  . Dehydration 12/06/2015  . CAD (coronary artery disease) 12/06/2015  . SBO (small bowel obstruction) ([place])   . Renal failure (ARF), acute on chronic (HCC)   . Back pain   .  Dyspnea   . Small bowel obstruction ([place])   . Partial small bowel obstruction ([place]) 07/24/2015  . Ileitis 07/24/2015  . CKD (chronic kidney disease) stage 3, GFR 30-59 ml/min (HCC) 07/24/2015  . Breast pain, left 04/13/2015  . Essential hypertension 12/10/2014  . COPD exacerbation ([place]) 12/10/2014  . Unstable angina ([place]) 12/09/2014  . Cramp of both  lower extremities 08/18/2014  . Hyperlipidemia 03/10/2014  . Pain in joint, pelvic region and thigh 03/10/2014  . COPD GOLD II  09/13/2013  . Osteoarthrosis 08/09/2013  . Disorder of kidney and ureter 07/16/2013  . Thoracic or lumbosacral neuritis or radiculitis 07/16/2013   PCP:  [name], MD Pharmacy:   [facility], [place] to [address] [place] [address] Phone: [phone] Fax: [place] #[id number] - [address], [place] [address] [place] [address] Phone: [phone] Fax: [phone]     Social Determinants of Health ([place]) Interventions    Readmission Risk Interventions No flowsheet data found.
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
## 2021-01-18 NOTE — Plan of Care (Signed)
  Problem: Education: Goal: Knowledge of General Education information will improve Description: Including pain rating scale, medication(s)/side effects and non-pharmacologic comfort measures Outcome: Progressing   Problem: Elimination: Goal: Will not experience complications related to bowel motility Outcome: Progressing   

## 2021-01-18 NOTE — Progress Notes (Signed)
Please see in addition to PT note:   SATURATION QUALIFICATIONS: (This note is used to comply with regulatory documentation for home oxygen)  Patient Saturations on Room Air at Rest = 95%  Patient Saturations on Room Air while Ambulating = 87%  Patient Saturations on 2 Liters of oxygen while Ambulating = 92%  Please briefly explain why patient needs home oxygen:SpO2 desaturation with longer gait distances on room air   Windell Norfolk, DPT, PN1   Supplemental Physical Therapist [place]    Pager [phone] Acute Rehab Office [phone]

## 2021-01-19 LAB — BASIC METABOLIC PANEL
Anion gap: 7 (ref 5–15)
BUN: 17 mg/dL (ref 8–23)
CO2: 30 mmol/L (ref 22–32)
Calcium: 9.2 mg/dL (ref 8.9–10.3)
Chloride: 104 mmol/L (ref 98–111)
Creatinine, Ser: 1.16 mg/dL — ABNORMAL HIGH (ref 0.44–1.00)
GFR, Estimated: 46 mL/min — ABNORMAL LOW (ref >60)
Glucose, Bld: 92 mg/dL (ref 70–99)
Potassium: 3.7 mmol/L (ref 3.5–5.1)
Sodium: 141 mmol/L (ref 135–145)

## 2021-01-19 LAB — CBC WITH DIFFERENTIAL/PLATELET
Abs Immature Granulocytes: 0.05 10*3/uL (ref 0.00–0.07)
Basophils Absolute: 0 10*3/uL (ref 0.0–0.1)
Basophils Relative: 1 %
Eosinophils Absolute: 0.2 10*3/uL (ref 0.0–0.5)
Eosinophils Relative: 3 %
HCT: 39.3 % (ref 36.0–46.0)
Hemoglobin: 12.6 g/dL (ref 12.0–15.0)
Immature Granulocytes: 1 %
Lymphocytes Relative: 33 %
Lymphs Abs: 1.8 10*3/uL (ref 0.7–4.0)
MCH: 29.9 pg (ref 26.0–34.0)
MCHC: 32.1 g/dL (ref 30.0–36.0)
MCV: 93.3 fL (ref 80.0–100.0)
Monocytes Absolute: 0.6 10*3/uL (ref 0.1–1.0)
Monocytes Relative: 11 %
Neutro Abs: 2.7 10*3/uL (ref 1.7–7.7)
Neutrophils Relative %: 51 %
Platelets: 253 10*3/uL (ref 150–400)
RBC: 4.21 MIL/uL (ref 3.87–5.11)
RDW: 13.5 % (ref 11.5–15.5)
WBC: 5.4 10*3/uL (ref 4.0–10.5)
nRBC: 0 % (ref 0.0–0.2)

## 2021-01-19 LAB — MAGNESIUM: Magnesium: 1.8 mg/dL (ref 1.7–2.4)

## 2021-01-19 MED ORDER — ENSURE ENLIVE PO LIQD
237.0000 mL | Freq: Two times a day (BID) | ORAL | Status: DC
  Administered 2021-01-19: 237 mL via ORAL

## 2021-01-19 MED ORDER — NITROGLYCERIN 0.4 MG SL SUBL: 0.4000 mg | SUBLINGUAL_TABLET | SUBLINGUAL | 0 refills | Status: AC | PRN

## 2021-01-19 MED ORDER — FUROSEMIDE 10 MG/ML IJ SOLN
20.0000 mg | Freq: Once | INTRAMUSCULAR | Status: AC
  Administered 2021-01-19: 20 mg via INTRAVENOUS
  Filled 2021-01-19: qty 2

## 2021-01-19 MED ORDER — SENNA 8.6 MG PO TABS: 1.0000 | ORAL_TABLET | Freq: Two times a day (BID) | ORAL | 0 refills | Status: DC

## 2021-01-19 MED ORDER — PROSOURCE PLUS PO LIQD
30.0000 mL | Freq: Two times a day (BID) | ORAL | Status: DC
  Administered 2021-01-19: 30 mL via ORAL
  Filled 2021-01-19: qty 30

## 2021-01-19 MED ORDER — FUROSEMIDE 20 MG PO TABS: 20.0000 mg | ORAL_TABLET | ORAL | Status: DC

## 2021-01-19 MED ORDER — ADULT MULTIVITAMIN W/MINERALS CH
1.0000 | ORAL_TABLET | Freq: Every day | ORAL | Status: DC
  Administered 2021-01-19: 1 via ORAL
  Filled 2021-01-19: qty 1

## 2021-01-19 MED ORDER — ONDANSETRON HCL 4 MG PO TABS: 4.0000 mg | ORAL_TABLET | Freq: Four times a day (QID) | ORAL | 0 refills | Status: DC | PRN
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
## 2021-01-19 NOTE — TOC Progression Note (Signed)
Transition of Care [facility]) - Progression Note    Patient Details  Name: Gina Bond MRN: [id number] Date of Birth: 18-Feb-1932  Transition of Care [facility] CM/SW Contact  Bartholomew Crews, RN Phone Number: [phone] 01/19/2021, 11:09 AM  Clinical Narrative:     Spoke with patient at the bedside. Verbal consent to reach out to patient's daughter, [name], to discuss transition planning for today. [name] expressed concerns about patient being able to get to outpatient PT and requested [facility] PT. Patient has access to RW, quad cane, transport chair, and BSC. Discussed community resources that assist with helping to keep people in their homes. Offered choice of home health agency - Referral accepted by [facility] for PT, Aide, and SW. HH/Face to Face order requested. Patient is pending ambulation sat test - will follow for oxygen needs.   Expected Discharge Plan: OP Rehab Barriers to Discharge: No Barriers Identified  Expected Discharge Plan and Services Expected Discharge Plan: OP Rehab In-house Referral: NA Discharge Planning Services: CM Consult Post Acute Care Choice: NA Living arrangements for the past 2 months: Single Family Home Expected Discharge Date: 01/19/21                         HH Arranged: PT,Social Work,Nurse's Aide HH Agency: [facility] Date [place]: 01/19/21 Time [place]: 1109 Representative spoke with at [place]: Amy   Social Determinants of Health ([place]) Interventions    Readmission Risk Interventions No flowsheet data found.

## 2021-01-19 NOTE — Progress Notes (Signed)
DISCHARGE NOTE HOME Stepahnie Campo to be discharged home per MD order. Discussed prescriptions and follow up appointments with the patient. Prescriptions given to patient; medication list explained in detail. Patient verbalized understanding.  Skin clean, dry and intact without evidence of skin break down, no evidence of skin tears noted. IV catheter discontinued intact. Site without signs and symptoms of complications. Dressing and pressure applied. Pt denies pain at the site currently. No complaints noted.  Patient free of lines, drains, and wounds.   An After Visit Summary (AVS) was printed and given to the patient. Patient escorted via wheelchair, and discharged home via private auto.  Megha Agnes S Jadesola Poynter, RN

## 2021-01-19 NOTE — Progress Notes (Signed)
SATURATION QUALIFICATIONS: (This note is used to comply with regulatory documentation for home oxygen)  Patient Saturations on Room Air at Rest = 99%  Patient Saturations on Room Air while Ambulating = 94%  Patient Saturations on 0 Liters of oxygen while Ambulating = 94%  Please briefly explain why patient needs home oxygen: Pt no longer needs the oxygen. Saturating  at 94% while ambulating with no oxygen.  Tayon Parekh, RN

## 2021-02-03 ENCOUNTER — Other Ambulatory Visit: Payer: Self-pay

## 2021-02-03 ENCOUNTER — Ambulatory Visit (INDEPENDENT_AMBULATORY_CARE_PROVIDER_SITE_OTHER): Payer: Medicare Other | Admitting: Podiatry

## 2021-02-03 ENCOUNTER — Encounter: Payer: Self-pay | Admitting: Podiatry

## 2021-02-03 DIAGNOSIS — M79676 Pain in unspecified toe(s): Secondary | ICD-10-CM

## 2021-02-03 DIAGNOSIS — B351 Tinea unguium: Secondary | ICD-10-CM

## 2021-02-10 NOTE — Progress Notes (Signed)
  Subjective:  Patient ID: Gina Bond, female    DOB: [birth year],  MRN: [id number]  Bonnetta Allbee presents to clinic today for painful thick toenails that are difficult to trim. Pain interferes with ambulation. Aggravating factors include wearing enclosed shoe gear. Pain is relieved with periodic professional debridement.  Her daughter, [name], is present during today's visit. States Ms. Montalban was hospitalized for bowel obstruction since her last visit with [place].  Review of Systems: Negative except as noted in the HPI.  Allergies  Allergen Reactions  . Benicar [Olmesartan] Shortness Of Breath    Must be same manufacturer or SOB symptoms  . Fenofibrate Shortness Of Breath    Must be the same manufacturer or SOB systems   . Sulfa Antibiotics Hives  . Sulfacetamide Sodium Hives  . Verapamil Shortness Of Breath    Must be same manufacturer or SOB symptoms   . Atorvastatin Other (See Comments)    Cramps in legs   . Magnesium Other (See Comments)    Leg cramps  . Rosuvastatin Calcium Other (See Comments)    Cramps in legs  . Spironolactone Other (See Comments)    BREATHING DIFFICULTIES   . Statins Other (See Comments)    Cramps in legs  . Other Rash    wool Pt states that steroid inhalers and a lot of other inhalers make her more SOB.  Pt states that changes in medications have caused her to have sob and chest pressure (change in manufacturer of medication)  . Sulfamethoxazole Rash  . Tobramycin Other (See Comments) and Rash    Blurred vision    Objective:   Constitutional Leialoha Hanna is a pleasant 87 y.o. Caucasian female, WD, WN in NAD.Marland Kitchen AAO x 3.   Vascular Capillary refill time to digits immediate b/l. Palpable pedal pulses b/l LE. Pedal hair present. Lower extremity skin temperature gradient within normal limits. No pain with calf compression b/l. No edema noted b/l lower extremities. No ischemia or gangrene noted b/l lower extremities.  No cyanosis  or clubbing noted.  Neurologic Normal speech. Oriented to person, place, and time. Epicritic sensation to light touch grossly present bilaterally. Protective sensation intact 5/5 intact bilaterally with 10g monofilament b/l. Vibratory sensation intact b/l. Proprioception intact bilaterally. Clonus negative b/l.  Dermatologic Pedal skin with normal turgor, texture and tone bilaterally. No open wounds bilaterally. No interdigital macerations bilaterally. Toenails 1-5 b/l elongated, discolored, dystrophic, thickened, crumbly with subungual debris and tenderness to dorsal palpation.  Orthopedic: Normal muscle strength 5/5 to all lower extremity muscle groups bilaterally. No pain crepitus or joint limitation noted with ROM b/l. No gross bony deformities bilaterally. Patient ambulates independent of any assistive aids.   Radiographs: None Assessment:   1. Pain due to onychomycosis of toenail    Plan:  Patient was evaluated and treated and all questions answered.  Onychomycosis with pain -Nails palliatively debridement as below -Educated on self-care  Procedure: Nail Debridement Rationale: Pain Type of Debridement: manual, sharp debridement. Instrumentation: Nail nipper, rotary burr. Number of Nails: 10 -Examined patient. -No new findings. No new orders. -Toenails 1-5 b/l were debrided in length and girth with sterile nail nippers and dremel without iatrogenic bleeding.  -Patient to report any pedal injuries to medical professional immediately. -Patient to continue soft, supportive shoe gear daily. -Patient/POA to call should there be question/concern in the interim.  Return in about 3 months (around 05/06/2021).  Marzetta Board, DPM

## 2021-04-11 ENCOUNTER — Other Ambulatory Visit: Payer: Self-pay | Admitting: Cardiology

## 2021-04-19 ENCOUNTER — Other Ambulatory Visit: Payer: Self-pay

## 2021-04-19 ENCOUNTER — Encounter: Payer: Self-pay | Admitting: Cardiology

## 2021-04-19 ENCOUNTER — Ambulatory Visit: Payer: Medicare Other | Admitting: Cardiology

## 2021-04-19 VITALS — BP 150/80 | HR 68 | Temp 97.8°F | Resp 17 | Ht 66.0 in | Wt 133.0 lb

## 2021-04-19 DIAGNOSIS — I714 Abdominal aortic aneurysm, without rupture, unspecified: Secondary | ICD-10-CM

## 2021-04-19 DIAGNOSIS — R600 Localized edema: Secondary | ICD-10-CM

## 2021-04-19 DIAGNOSIS — N1832 Chronic kidney disease, stage 3b: Secondary | ICD-10-CM

## 2021-04-19 DIAGNOSIS — I2781 Cor pulmonale (chronic): Secondary | ICD-10-CM

## 2021-04-19 DIAGNOSIS — I10 Essential (primary) hypertension: Secondary | ICD-10-CM

## 2021-04-19 MED ORDER — FUROSEMIDE 20 MG PO TABS: 20.0000 mg | ORAL_TABLET | ORAL | 3 refills | Status: DC
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
## 2021-04-23 ENCOUNTER — Other Ambulatory Visit: Payer: Self-pay

## 2021-04-23 DIAGNOSIS — R600 Localized edema: Secondary | ICD-10-CM

## 2021-04-23 DIAGNOSIS — N1832 Chronic kidney disease, stage 3b: Secondary | ICD-10-CM

## 2021-04-23 MED ORDER — FUROSEMIDE 20 MG PO TABS: 20.0000 mg | ORAL_TABLET | ORAL | 3 refills | Status: DC

## 2021-05-19 ENCOUNTER — Other Ambulatory Visit: Payer: Self-pay

## 2021-05-19 ENCOUNTER — Ambulatory Visit (INDEPENDENT_AMBULATORY_CARE_PROVIDER_SITE_OTHER): Payer: Medicare Other | Admitting: Podiatry

## 2021-05-19 ENCOUNTER — Encounter: Payer: Self-pay | Admitting: Podiatry

## 2021-05-19 DIAGNOSIS — B351 Tinea unguium: Secondary | ICD-10-CM | POA: Diagnosis not present

## 2021-05-19 DIAGNOSIS — M79676 Pain in unspecified toe(s): Secondary | ICD-10-CM | POA: Diagnosis not present

## 2021-05-25 NOTE — Progress Notes (Signed)
Subjective: Gina Bond is a pleasant 87 y.o. female patient seen today painful thick toenails that are difficult to trim. Pain interferes with ambulation. Aggravating factors include wearing enclosed shoe gear. Pain is relieved with periodic professional debridement.  Her daughter is present during today's visit. They voice no new pedal problems on today's visit.  PCP is [name], MD. Last visit was: 04/30/2021.  Allergies  Allergen Reactions   Benicar [Olmesartan] Shortness Of Breath    Must be same manufacturer or SOB symptoms   Fenofibrate Shortness Of Breath    Must be the same manufacturer or SOB systems    Sulfa Antibiotics Hives   Sulfacetamide Sodium Hives   Atorvastatin Other (See Comments)    Cramps in legs    Magnesium Other (See Comments)    Leg cramps   Rosuvastatin Calcium Other (See Comments)    Cramps in legs   Spironolactone Other (See Comments)    BREATHING DIFFICULTIES    Statins Other (See Comments)    Cramps in legs   Other Rash    wool Pt states that steroid inhalers and a lot of other inhalers make her more SOB.  Pt states that changes in medications have caused her to have sob and chest pressure (change in manufacturer of medication) Certain dyes; Fillers used by some manufacturers   Sulfamethoxazole Rash   Tobramycin Other (See Comments) and Rash    Blurred vision    Objective: Physical Exam  General: Gina Bond is a pleasant 87 y.o. Caucasian female, WD, WN in NAD. AAO x 3.   Vascular:  Capillary refill time to digits immediate b/l. Palpable DP pulse(s) b/l lower extremities Palpable PT pulse(s) b/l lower extremities Pedal hair present. Lower extremity skin temperature gradient within normal limits. No pain with calf compression b/l. No edema noted b/l lower extremities.  Dermatological:  Toenails 1-5 b/l elongated, discolored, dystrophic, thickened, crumbly with subungual debris and tenderness to dorsal  palpation.  Musculoskeletal:  Normal muscle strength 5/5 to all lower extremity muscle groups bilaterally. No pain crepitus or joint limitation noted with ROM b/l. No gross bony deformities bilaterally.  Neurological:  Protective sensation intact 5/5 intact bilaterally with 10g monofilament b/l. Vibratory sensation intact b/l.  Assessment and Plan:  1. Pain due to onychomycosis of toenail     -Examined patient. -Patient to continue soft, supportive shoe gear daily. -Toenails 1-5 b/l were debrided in length and girth with sterile nail nippers and dremel without iatrogenic bleeding.  -Patient to report any pedal injuries to medical professional immediately. -Patient/POA to call should there be question/concern in the interim.  Return in about 3 months (around 08/19/2021).  Marzetta Board, DPM

## 2021-08-11 ENCOUNTER — Telehealth: Payer: Self-pay | Admitting: Cardiology

## 2021-08-11 ENCOUNTER — Other Ambulatory Visit: Payer: Self-pay

## 2021-08-11 ENCOUNTER — Ambulatory Visit: Payer: Medicare Other

## 2021-08-11 DIAGNOSIS — I714 Abdominal aortic aneurysm, without rupture, unspecified: Secondary | ICD-10-CM

## 2021-08-11 NOTE — Telephone Encounter (Signed)
Pt's daughter called, stating she wanted to discuss pt's hydralazine prescription w/med assistant. Says pt's kidney dr had inquired about it at recent appt & said to discuss w/cardiology, pt's bp has been high.

## 2021-08-12 ENCOUNTER — Other Ambulatory Visit: Payer: Self-pay

## 2021-08-12 DIAGNOSIS — I209 Angina pectoris, unspecified: Secondary | ICD-10-CM

## 2021-08-12 DIAGNOSIS — I10 Essential (primary) hypertension: Secondary | ICD-10-CM

## 2021-08-12 MED ORDER — ISOSORBIDE MONONITRATE ER 120 MG PO TB24: 120.0000 mg | ORAL_TABLET | Freq: Every day | ORAL | 0 refills | Status: DC

## 2021-08-13 ENCOUNTER — Other Ambulatory Visit: Payer: Self-pay | Admitting: Cardiology

## 2021-08-13 DIAGNOSIS — I10 Essential (primary) hypertension: Secondary | ICD-10-CM

## 2021-08-13 MED ORDER — HYDRALAZINE HCL 100 MG PO TABS: 100.0000 mg | ORAL_TABLET | Freq: Three times a day (TID) | ORAL | 1 refills | Status: DC

## 2021-08-13 NOTE — Telephone Encounter (Signed)
Pts daughter said they saw the nephrologist and he wondered if they could increase hydralazine to half a tablet more a day. Pts BP was up at the appt but she had not taking the appt that morning. Pts BP is still all over the place. Has been trending down. They did a CT on her head recently, her PCP sent her to get an MRI on her head in a few weeks. Pts daughter said that the pts bp fluctuates and she is not sure if that is important information considering that she was at quite a few doctor appointment when this happened. Please advise.

## 2021-08-13 NOTE — Telephone Encounter (Signed)
Called and spoke with patient's daughter, clarified that patient is to hold next dose of hydralazine if SBP <100 mmHg.

## 2021-08-13 NOTE — Telephone Encounter (Signed)
Increase hydralazine 50 mg tablets to 2 tablets 3 times a day, I have sent in a new prescription, I also sent a MyChart message, inquire if the got it

## 2021-08-13 NOTE — Telephone Encounter (Signed)
Called and spoke to pts daughter. She voiced understanding and will relay the message to the pt.

## 2021-08-16 NOTE — Telephone Encounter (Signed)
From pt

## 2021-08-17 ENCOUNTER — Inpatient Hospital Stay (HOSPITAL_COMMUNITY)
Admission: EM | Admit: 2021-08-17 | Discharge: 2021-08-24 | DRG: 511 | Disposition: A | Discharge status: Skilled Nursing Facility | Payer: Medicare Other | Attending: Hospitalist | Admitting: Hospitalist

## 2021-08-17 ENCOUNTER — Emergency Department (HOSPITAL_COMMUNITY): Payer: Medicare Other

## 2021-08-17 ENCOUNTER — Encounter (HOSPITAL_COMMUNITY): Payer: Self-pay

## 2021-08-17 DIAGNOSIS — W010XXA Fall on same level from slipping, tripping and stumbling without subsequent striking against object, initial encounter: Secondary | ICD-10-CM | POA: Diagnosis present

## 2021-08-17 DIAGNOSIS — Y92009 Unspecified place in unspecified non-institutional (private) residence as the place of occurrence of the external cause: Secondary | ICD-10-CM

## 2021-08-17 DIAGNOSIS — S52572A Other intraarticular fracture of lower end of left radius, initial encounter for closed fracture: Principal | ICD-10-CM | POA: Diagnosis present

## 2021-08-17 DIAGNOSIS — F05 Delirium due to known physiological condition: Secondary | ICD-10-CM | POA: Diagnosis present

## 2021-08-17 DIAGNOSIS — Z91048 Other nonmedicinal substance allergy status: Secondary | ICD-10-CM

## 2021-08-17 DIAGNOSIS — Z85828 Personal history of other malignant neoplasm of skin: Secondary | ICD-10-CM

## 2021-08-17 DIAGNOSIS — E860 Dehydration: Secondary | ICD-10-CM | POA: Diagnosis present

## 2021-08-17 DIAGNOSIS — S42211A Unspecified displaced fracture of surgical neck of right humerus, initial encounter for closed fracture: Secondary | ICD-10-CM | POA: Diagnosis present

## 2021-08-17 DIAGNOSIS — N179 Acute kidney failure, unspecified: Secondary | ICD-10-CM | POA: Diagnosis present

## 2021-08-17 DIAGNOSIS — J449 Chronic obstructive pulmonary disease, unspecified: Secondary | ICD-10-CM | POA: Diagnosis present

## 2021-08-17 DIAGNOSIS — N1832 Chronic kidney disease, stage 3b: Secondary | ICD-10-CM | POA: Diagnosis present

## 2021-08-17 DIAGNOSIS — Z9119 Patient's noncompliance with other medical treatment and regimen: Secondary | ICD-10-CM | POA: Diagnosis not present

## 2021-08-17 DIAGNOSIS — E782 Mixed hyperlipidemia: Secondary | ICD-10-CM | POA: Diagnosis present

## 2021-08-17 DIAGNOSIS — M25552 Pain in left hip: Secondary | ICD-10-CM | POA: Diagnosis present

## 2021-08-17 DIAGNOSIS — Y9302 Activity, running: Secondary | ICD-10-CM | POA: Diagnosis present

## 2021-08-17 DIAGNOSIS — Z882 Allergy status to sulfonamides status: Secondary | ICD-10-CM | POA: Diagnosis not present

## 2021-08-17 DIAGNOSIS — H3530 Unspecified macular degeneration: Secondary | ICD-10-CM | POA: Diagnosis present

## 2021-08-17 DIAGNOSIS — I2729 Other secondary pulmonary hypertension: Secondary | ICD-10-CM | POA: Diagnosis present

## 2021-08-17 DIAGNOSIS — S42291A Other displaced fracture of upper end of right humerus, initial encounter for closed fracture: Secondary | ICD-10-CM | POA: Diagnosis present

## 2021-08-17 DIAGNOSIS — I209 Angina pectoris, unspecified: Secondary | ICD-10-CM

## 2021-08-17 DIAGNOSIS — L740 Miliaria rubra: Secondary | ICD-10-CM | POA: Diagnosis not present

## 2021-08-17 DIAGNOSIS — Z87891 Personal history of nicotine dependence: Secondary | ICD-10-CM

## 2021-08-17 DIAGNOSIS — I714 Abdominal aortic aneurysm, without rupture, unspecified: Secondary | ICD-10-CM | POA: Diagnosis present

## 2021-08-17 DIAGNOSIS — Z79899 Other long term (current) drug therapy: Secondary | ICD-10-CM

## 2021-08-17 DIAGNOSIS — Z883 Allergy status to other anti-infective agents status: Secondary | ICD-10-CM | POA: Diagnosis not present

## 2021-08-17 DIAGNOSIS — Z20822 Contact with and (suspected) exposure to covid-19: Secondary | ICD-10-CM | POA: Diagnosis present

## 2021-08-17 DIAGNOSIS — Z888 Allergy status to other drugs, medicaments and biological substances status: Secondary | ICD-10-CM

## 2021-08-17 DIAGNOSIS — I2510 Atherosclerotic heart disease of native coronary artery without angina pectoris: Secondary | ICD-10-CM | POA: Diagnosis present

## 2021-08-17 DIAGNOSIS — S52502A Unspecified fracture of the lower end of left radius, initial encounter for closed fracture: Secondary | ICD-10-CM | POA: Diagnosis present

## 2021-08-17 DIAGNOSIS — S42441A Displaced fracture (avulsion) of medial epicondyle of right humerus, initial encounter for closed fracture: Secondary | ICD-10-CM | POA: Diagnosis present

## 2021-08-17 DIAGNOSIS — W19XXXA Unspecified fall, initial encounter: Secondary | ICD-10-CM

## 2021-08-17 DIAGNOSIS — I129 Hypertensive chronic kidney disease with stage 1 through stage 4 chronic kidney disease, or unspecified chronic kidney disease: Secondary | ICD-10-CM | POA: Diagnosis present

## 2021-08-17 DIAGNOSIS — S82142A Displaced bicondylar fracture of left tibia, initial encounter for closed fracture: Secondary | ICD-10-CM | POA: Diagnosis present

## 2021-08-17 DIAGNOSIS — Z825 Family history of asthma and other chronic lower respiratory diseases: Secondary | ICD-10-CM

## 2021-08-17 DIAGNOSIS — S42444A Nondisplaced fracture (avulsion) of medial epicondyle of right humerus, initial encounter for closed fracture: Secondary | ICD-10-CM

## 2021-08-17 DIAGNOSIS — Z905 Acquired absence of kidney: Secondary | ICD-10-CM

## 2021-08-17 DIAGNOSIS — Z8249 Family history of ischemic heart disease and other diseases of the circulatory system: Secondary | ICD-10-CM

## 2021-08-17 DIAGNOSIS — I10 Essential (primary) hypertension: Secondary | ICD-10-CM

## 2021-08-17 DIAGNOSIS — L299 Pruritus, unspecified: Secondary | ICD-10-CM | POA: Diagnosis not present

## 2021-08-17 DIAGNOSIS — R451 Restlessness and agitation: Secondary | ICD-10-CM | POA: Diagnosis not present

## 2021-08-17 DIAGNOSIS — M549 Dorsalgia, unspecified: Secondary | ICD-10-CM

## 2021-08-17 LAB — CBC WITH DIFFERENTIAL/PLATELET
Abs Immature Granulocytes: 0.08 10*3/uL — ABNORMAL HIGH (ref 0.00–0.07)
Abs Immature Granulocytes: 0.04 10*3/uL (ref 0.00–0.07)
Basophils Absolute: 0.1 10*3/uL (ref 0.0–0.1)
Basophils Absolute: 0 10*3/uL (ref 0.0–0.1)
Basophils Relative: 1 %
Basophils Relative: 0 %
Eosinophils Absolute: 0.1 10*3/uL (ref 0.0–0.5)
Eosinophils Absolute: 0 10*3/uL (ref 0.0–0.5)
Eosinophils Relative: 1 %
Eosinophils Relative: 0 %
HCT: 42 % (ref 36.0–46.0)
HCT: 39.6 % (ref 36.0–46.0)
Hemoglobin: 13.5 g/dL (ref 12.0–15.0)
Hemoglobin: 13 g/dL (ref 12.0–15.0)
Immature Granulocytes: 1 %
Immature Granulocytes: 0 %
Lymphocytes Relative: 11 %
Lymphocytes Relative: 14 %
Lymphs Abs: 1.2 10*3/uL (ref 0.7–4.0)
Lymphs Abs: 1.4 10*3/uL (ref 0.7–4.0)
MCH: 29.5 pg (ref 26.0–34.0)
MCH: 29.7 pg (ref 26.0–34.0)
MCHC: 32.1 g/dL (ref 30.0–36.0)
MCHC: 32.8 g/dL (ref 30.0–36.0)
MCV: 91.9 fL (ref 80.0–100.0)
MCV: 90.6 fL (ref 80.0–100.0)
Monocytes Absolute: 0.6 10*3/uL (ref 0.1–1.0)
Monocytes Absolute: 0.6 10*3/uL (ref 0.1–1.0)
Monocytes Relative: 6 %
Monocytes Relative: 6 %
Neutro Abs: 9.1 10*3/uL — ABNORMAL HIGH (ref 1.7–7.7)
Neutro Abs: 7.4 10*3/uL (ref 1.7–7.7)
Neutrophils Relative %: 80 %
Neutrophils Relative %: 80 %
Platelets: 243 10*3/uL (ref 150–400)
Platelets: 223 10*3/uL (ref 150–400)
RBC: 4.57 MIL/uL (ref 3.87–5.11)
RBC: 4.37 MIL/uL (ref 3.87–5.11)
RDW: 13.8 % (ref 11.5–15.5)
RDW: 13.7 % (ref 11.5–15.5)
WBC: 11.1 10*3/uL — ABNORMAL HIGH (ref 4.0–10.5)
WBC: 9.4 10*3/uL (ref 4.0–10.5)
nRBC: 0 % (ref 0.0–0.2)
nRBC: 0 % (ref 0.0–0.2)

## 2021-08-17 LAB — BASIC METABOLIC PANEL
Anion gap: 11 (ref 5–15)
BUN: 18 mg/dL (ref 8–23)
CO2: 25 mmol/L (ref 22–32)
Calcium: 9.4 mg/dL (ref 8.9–10.3)
Chloride: 101 mmol/L (ref 98–111)
Creatinine, Ser: 1.23 mg/dL — ABNORMAL HIGH (ref 0.44–1.00)
GFR, Estimated: 43 mL/min — ABNORMAL LOW (ref >60)
Glucose, Bld: 109 mg/dL — ABNORMAL HIGH (ref 70–99)
Potassium: 4.1 mmol/L (ref 3.5–5.1)
Sodium: 137 mmol/L (ref 135–145)

## 2021-08-17 LAB — PROTIME-INR
INR: 1 (ref 0.8–1.2)
Prothrombin Time: 12.8 seconds (ref 11.4–15.2)

## 2021-08-17 LAB — RESP PANEL BY RT-PCR (FLU A&B, COVID) ARPGX2
Influenza A by PCR: NEGATIVE
Influenza B by PCR: NEGATIVE
SARS Coronavirus 2 by RT PCR: NEGATIVE

## 2021-08-17 LAB — MAGNESIUM: Magnesium: 2.1 mg/dL (ref 1.7–2.4)

## 2021-08-17 LAB — APTT: aPTT: 29 seconds (ref 24–36)

## 2021-08-17 MED ORDER — POLYVINYL ALCOHOL 1.4 % OP SOLN
1.0000 [drp] | Freq: Two times a day (BID) | OPHTHALMIC | Status: DC
  Administered 2021-08-17 – 2021-08-24 (×11): 1 [drp] via OPHTHALMIC
  Filled 2021-08-17: qty 15

## 2021-08-17 MED ORDER — ACETAMINOPHEN 325 MG PO TABS
650.0000 mg | ORAL_TABLET | Freq: Two times a day (BID) | ORAL | Status: DC
  Administered 2021-08-18 – 2021-08-23 (×8): 650 mg via ORAL
  Filled 2021-08-17 (×10): qty 2

## 2021-08-17 MED ORDER — ACETAMINOPHEN 650 MG RE SUPP: 650.0000 mg | Freq: Four times a day (QID) | RECTAL | Status: DC | PRN

## 2021-08-17 MED ORDER — MORPHINE SULFATE (PF) 2 MG/ML IV SOLN: 1.0000 mg | INTRAVENOUS | Status: DC | PRN

## 2021-08-17 MED ORDER — HYDRALAZINE HCL 100 MG PO TABS: 100.0000 mg | ORAL_TABLET | Freq: Three times a day (TID) | ORAL | Status: DC

## 2021-08-17 MED ORDER — ENALAPRILAT 1.25 MG/ML IV SOLN
0.6250 mg | Freq: Four times a day (QID) | INTRAVENOUS | Status: DC | PRN
  Filled 2021-08-17: qty 0.5

## 2021-08-17 MED ORDER — PANTOPRAZOLE SODIUM 40 MG PO TBEC
40.0000 mg | DELAYED_RELEASE_TABLET | Freq: Every day | ORAL | Status: DC
  Administered 2021-08-18 – 2021-08-24 (×6): 40 mg via ORAL
  Filled 2021-08-17 (×8): qty 1

## 2021-08-17 MED ORDER — NITROGLYCERIN 0.4 MG SL SUBL: 0.4000 mg | SUBLINGUAL_TABLET | SUBLINGUAL | Status: DC | PRN

## 2021-08-17 MED ORDER — POLYETHYLENE GLYCOL 3350 17 G PO PACK
17.0000 g | PACK | Freq: Every day | ORAL | Status: DC
  Administered 2021-08-17 – 2021-08-23 (×6): 17 g via ORAL
  Filled 2021-08-17 (×7): qty 1

## 2021-08-17 MED ORDER — HYDRALAZINE HCL 50 MG PO TABS
50.0000 mg | ORAL_TABLET | Freq: Two times a day (BID) | ORAL | Status: DC
  Administered 2021-08-17 – 2021-08-18 (×3): 50 mg via ORAL
  Filled 2021-08-17 (×3): qty 1

## 2021-08-17 MED ORDER — POLYETHYL GLYC-PROPYL GLYC PF 0.4-0.3 % OP SOLN: 1.0000 [drp] | Freq: Two times a day (BID) | OPHTHALMIC | Status: DC

## 2021-08-17 MED ORDER — FUROSEMIDE 20 MG PO TABS
20.0000 mg | ORAL_TABLET | ORAL | Status: DC
  Administered 2021-08-19: 20 mg via ORAL
  Filled 2021-08-17 (×2): qty 1

## 2021-08-17 MED ORDER — LACTATED RINGERS IV SOLN: INTRAVENOUS | Status: DC

## 2021-08-17 MED ORDER — ONDANSETRON HCL 4 MG/2ML IJ SOLN: 4.0000 mg | Freq: Four times a day (QID) | INTRAMUSCULAR | Status: DC | PRN

## 2021-08-17 MED ORDER — MORPHINE SULFATE (PF) 2 MG/ML IV SOLN
2.0000 mg | INTRAVENOUS | Status: DC | PRN
  Administered 2021-08-17 – 2021-08-18 (×3): 2 mg via INTRAVENOUS
  Filled 2021-08-17 (×3): qty 1

## 2021-08-17 MED ORDER — ACETAMINOPHEN 325 MG PO TABS
650.0000 mg | ORAL_TABLET | Freq: Four times a day (QID) | ORAL | Status: DC | PRN
  Administered 2021-08-18: 650 mg via ORAL
  Filled 2021-08-17: qty 2

## 2021-08-17 MED ORDER — ISOSORBIDE MONONITRATE ER 60 MG PO TB24
120.0000 mg | ORAL_TABLET | Freq: Every day | ORAL | Status: DC
  Administered 2021-08-18 – 2021-08-23 (×6): 120 mg via ORAL
  Filled 2021-08-17 (×6): qty 2

## 2021-08-17 MED ORDER — PRAVASTATIN SODIUM 40 MG PO TABS
40.0000 mg | ORAL_TABLET | Freq: Every day | ORAL | Status: DC
  Administered 2021-08-17 – 2021-08-23 (×6): 40 mg via ORAL
  Filled 2021-08-17: qty 2
  Filled 2021-08-17 (×4): qty 1
  Filled 2021-08-17: qty 2

## 2021-08-17 MED ORDER — ATROPINE SULFATE 1 % OP SOLN: 1.0000 [drp] | Freq: Every day | OPHTHALMIC | Status: DC

## 2021-08-17 MED ORDER — FENOFIBRATE 160 MG PO TABS
160.0000 mg | ORAL_TABLET | Freq: Every day | ORAL | Status: DC
Start: 2021-08-18 — End: 2021-08-24
  Administered 2021-08-18 – 2021-08-23 (×6): 160 mg via ORAL
  Filled 2021-08-17 (×6): qty 1

## 2021-08-17 MED ORDER — HYDRALAZINE HCL 50 MG PO TABS
100.0000 mg | ORAL_TABLET | Freq: Every day | ORAL | Status: DC
  Administered 2021-08-18: 100 mg via ORAL
  Filled 2021-08-17: qty 2

## 2021-08-17 MED ORDER — ONDANSETRON HCL 4 MG/2ML IJ SOLN
4.0000 mg | Freq: Once | INTRAMUSCULAR | Status: AC
  Administered 2021-08-17: 4 mg via INTRAVENOUS
  Filled 2021-08-17: qty 2

## 2021-08-17 MED ORDER — VERAPAMIL HCL ER 240 MG PO TBCR
240.0000 mg | EXTENDED_RELEASE_TABLET | Freq: Two times a day (BID) | ORAL | Status: DC
  Administered 2021-08-18 – 2021-08-24 (×12): 240 mg via ORAL
  Filled 2021-08-17 (×15): qty 1

## 2021-08-17 MED ORDER — ALBUTEROL SULFATE (2.5 MG/3ML) 0.083% IN NEBU: 2.5000 mg | INHALATION_SOLUTION | Freq: Four times a day (QID) | RESPIRATORY_TRACT | Status: DC | PRN

## 2021-08-17 MED ORDER — POLYETHYLENE GLYCOL 3350 17 G PO PACK: 17.0000 g | PACK | Freq: Every day | ORAL | Status: DC | PRN

## 2021-08-17 MED ORDER — PREDNISOLONE ACETATE 1 % OP SUSP: 1.0000 [drp] | Freq: Every day | OPHTHALMIC | Status: DC

## 2021-08-17 MED ORDER — SENNA 8.6 MG PO TABS
2.0000 | ORAL_TABLET | Freq: Every day | ORAL | Status: DC
  Administered 2021-08-17 – 2021-08-23 (×6): 17.2 mg via ORAL
  Filled 2021-08-17 (×7): qty 2

## 2021-08-17 MED ORDER — ONDANSETRON HCL 4 MG PO TABS: 4.0000 mg | ORAL_TABLET | Freq: Four times a day (QID) | ORAL | Status: DC | PRN

## 2021-08-17 MED ORDER — MORPHINE SULFATE (PF) 2 MG/ML IV SOLN
2.0000 mg | Freq: Once | INTRAVENOUS | Status: AC
  Administered 2021-08-17: 2 mg via INTRAVENOUS
  Filled 2021-08-17: qty 1

## 2021-08-17 NOTE — Progress Notes (Signed)
Orthopedic Tech Progress Note Patient Details:  Gina Bond 12-20-1931 [id number]  Ortho Devices Type of Ortho Device: Sugartong splint, Knee Immobilizer, Ace wrap, Shoulder immobilizer Ortho Device/Splint Location: Sugartong applied to left arm with a sling. Shoulder immoblizer applied to right arm. Appplied knee immoblizer to left leg. Ortho Device/Splint Interventions: Ordered, Application, Adjustment   Post Interventions Patient Tolerated: Well Instructions Provided: Adjustment of device, Care of device  [name] 08/17/2021, 6:19 PM

## 2021-08-17 NOTE — Progress Notes (Signed)
.  Transition of Care [facility]) - Emergency Department Mini Assessment   Patient Details  Name: Gina Bond MRN: [id number] Date of Birth: 08/26/1932  Transition of Care [facility]) CM/SW Contact:    Illene Regulus, LCSW Phone Number: 08/17/2021, 6:04 PM   Clinical Narrative:  CSW spoke with Silverio Decamp, PA , pt will be admitted . TOC continue to follow disposition throughout d/c planing.   Arlie Solomons.Augusto Deckman, MSW, [place]  Transitions of Care Clinical Social Worker I Direct Dial: [phone]  Fax: [phone] [email or web address]   ED Mini Assessment: What brought you to the Emergency Department? : fall  Barriers to Discharge: Continued Medical Work up             Patient Contact and Communications        ,                 Admission diagnosis:  fall Patient Active Problem List   Diagnosis Date Noted   Basal cell carcinoma (BCC) of skin of nose 07/07/2020   Facial swelling 07/07/2020   AAA (abdominal aortic aneurysm) without rupture ([place]) 09/04/2018   Chronic constipation 06/15/2017   Diarrhea 03/17/2017   Right hip pain 05/26/2016   Hypoxia    Enteritis 12/06/2015   Dehydration 12/06/2015   CAD (coronary artery disease) 12/06/2015   SBO (small bowel obstruction) ([place])    Renal failure (ARF), acute on chronic (HCC)    Back pain    Dyspnea    Small bowel obstruction (HCC)    Partial small bowel obstruction ([place]) 07/24/2015   Ileitis 07/24/2015   CKD (chronic kidney disease) stage 3, GFR 30-59 ml/min ([place]) 07/24/2015   Breast pain, left 04/13/2015   Essential hypertension 12/10/2014   COPD exacerbation ([place]) 12/10/2014   Unstable angina ([place]) 12/09/2014   Cramp of both lower extremities 08/18/2014   Hyperlipidemia 03/10/2014   Pain in joint, pelvic region and thigh 03/10/2014   COPD GOLD II  09/13/2013   Osteoarthrosis 08/09/2013   Disorder of kidney and ureter 07/16/2013    Thoracic or lumbosacral neuritis or radiculitis 07/16/2013   PCP:  [name], MD Pharmacy:   [place], [address] AT Portal to Registered [place] [address] Phone: [phone] Fax: [facility] [id number] - [place], [address] AT [facility] [address] [place] [address] Phone: [phone] Fax: [phone]  [id number] [place], [id number]: [phone]: [phone]
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
## 2021-08-17 NOTE — H&P (Signed)
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 conversation  Status is: Inpatient  Remains inpatient appropriate because:Ongoing active pain requiring inpatient pain management, Ongoing diagnostic testing needed not appropriate for outpatient work up, IV treatments appropriate due to intensity of illness or inability to take PO, and Inpatient level of care appropriate due to severity of illness  Dispo: The patient is from: Home              Anticipated d/c is to: SNF              Patient currently is not medically stable to d/c.   Difficult to place patient No        Vernelle Emerald MD [facility] Pager [phone]  If 7PM-7AM, please contact night-coverage [email or web address] Use universal [place] password for that web site. If you do not  have the password, please call the  hospital operator.  08/17/2021, 10:27 PM
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
## 2021-08-17 NOTE — ED Triage Notes (Signed)
Pt arrived via EMS, from home, mechanical fall over dog onto pavement. C/o right shoulder pain, left wrist pain and bilateral knee pain. No blood thinners. No LOC.

## 2021-08-17 NOTE — ED Provider Notes (Signed)
Emergency Medicine Provider Triage Evaluation Note  Gina Bond , a 87 y.o. female  was evaluated in triage.  Pt complains of right shoulder pain and left wrist pain after mechanical fall.  Was running to stop the dog's toy from [place] when she tripped and fell forward onto outstretched arms.  She is not anticoagulated..  Review of Systems  Positive: Right shoulder pain, left wrist pain Negative: Head trauma, LOC, nausea, vomiting, blurry or double vision.  Physical Exam  BP 131/84 (BP Location: Left Arm)   Pulse 84   Temp 98.7 F (37.1 C) (Oral)   Resp 18   SpO2 98%  Gen:   Awake, no distress   Resp:  Normal effort  MSK:   Moves extremities without difficulty  Other:  Deformity to the right shoulder concerning for dislocation +/- fracture, deformity of the left wrist with decreased radial pulse but normal cap refill in all 5 digits of the left hand.  Medical Decision Making  Medically screening exam initiated at 2:28 PM.  Appropriate orders placed.  Noehmi Cott was informed that the remainder of the evaluation will be completed by another provider, this initial triage assessment does not replace that evaluation, and the importance of remaining in the ED until their evaluation is complete.  Charge RN informed patient to be roomed next.  This chart was dictated using voice recognition software, Dragon. Despite the best efforts of this provider to proofread and correct errors, errors may still occur which can change documentation meaning.    Emeline Darling, PA-C 08/17/21 [place], DO 08/17/21 1522

## 2021-08-18 ENCOUNTER — Other Ambulatory Visit: Payer: Self-pay

## 2021-08-18 ENCOUNTER — Inpatient Hospital Stay (HOSPITAL_COMMUNITY): Payer: Medicare Other

## 2021-08-18 DIAGNOSIS — Y92009 Unspecified place in unspecified non-institutional (private) residence as the place of occurrence of the external cause: Secondary | ICD-10-CM | POA: Diagnosis not present

## 2021-08-18 DIAGNOSIS — W19XXXA Unspecified fall, initial encounter: Secondary | ICD-10-CM | POA: Diagnosis not present

## 2021-08-18 DIAGNOSIS — I714 Abdominal aortic aneurysm, without rupture: Secondary | ICD-10-CM | POA: Diagnosis not present

## 2021-08-18 DIAGNOSIS — J449 Chronic obstructive pulmonary disease, unspecified: Secondary | ICD-10-CM | POA: Diagnosis not present

## 2021-08-18 LAB — SURGICAL PCR SCREEN
MRSA, PCR: NEGATIVE
Staphylococcus aureus: NEGATIVE

## 2021-08-18 LAB — VITAMIN D 25 HYDROXY (VIT D DEFICIENCY, FRACTURES): Vit D, 25-Hydroxy: 36.65 ng/mL (ref 30–100)

## 2021-08-18 MED ORDER — MORPHINE SULFATE (PF) 2 MG/ML IV SOLN
2.0000 mg | INTRAVENOUS | Status: DC | PRN
  Administered 2021-08-19 – 2021-08-20 (×3): 2 mg via INTRAVENOUS
  Filled 2021-08-18 (×4): qty 1

## 2021-08-18 MED ORDER — HYDRALAZINE HCL 50 MG PO TABS
50.0000 mg | ORAL_TABLET | Freq: Three times a day (TID) | ORAL | Status: DC
  Administered 2021-08-19 – 2021-08-20 (×5): 50 mg via ORAL
  Filled 2021-08-18 (×5): qty 1

## 2021-08-18 MED ORDER — HYDROCODONE-ACETAMINOPHEN 5-325 MG PO TABS
1.0000 | ORAL_TABLET | ORAL | Status: DC | PRN
  Administered 2021-08-18 – 2021-08-19 (×4): 1 via ORAL
  Administered 2021-08-20 – 2021-08-24 (×6): 2 via ORAL
  Filled 2021-08-18 (×2): qty 2
  Filled 2021-08-18 (×3): qty 1
  Filled 2021-08-18 (×3): qty 2
  Filled 2021-08-18: qty 1
  Filled 2021-08-18 (×2): qty 2

## 2021-08-18 MED ORDER — TRAMADOL HCL 50 MG PO TABS
50.0000 mg | ORAL_TABLET | Freq: Four times a day (QID) | ORAL | Status: DC | PRN
  Administered 2021-08-20 – 2021-08-21 (×2): 50 mg via ORAL
  Filled 2021-08-18 (×2): qty 1

## 2021-08-18 MED ORDER — DIPHENHYDRAMINE HCL 25 MG PO CAPS
25.0000 mg | ORAL_CAPSULE | Freq: Four times a day (QID) | ORAL | Status: DC | PRN
  Administered 2021-08-18 – 2021-08-20 (×4): 25 mg via ORAL
  Filled 2021-08-18 (×4): qty 1

## 2021-08-18 NOTE — Progress Notes (Signed)
Lengthy conversation with pts daughter Gina Bond who cant sleep for worrying about her mother. Spoke about her concerns about her mother needing surgery and her comorbidity. She is worried about her recovery and if she will even survive surgery. If she is not in the room when the doctors visit the pt she wants phone calls, a I assured her she would get updated

## 2021-08-18 NOTE — Progress Notes (Signed)
Subjective:   s/p Procedure(s): OPEN REDUCTION INTERNAL FIXATION (ORIF) DISTAL RADIAL FRACTURE   Patient is alert, oriented.  Patient reports pain as mild at rest but severe with movement. Pain is all over. Complaining of back pain this morning as well as right shoulder, left wrist, and left knee pain.  Denies chest pain, SOB, Calf pain. No nausea/vomiting.    Objective:  PE: VITALS:   Vitals:   08/17/21 2146 08/17/21 2155 08/18/21 0151 08/18/21 0535  BP: (!) 174/90  (!) 152/76 (!) 166/72  Pulse: 72  75 72  Resp: '20  18 20  '$ Temp: 99.2 F (37.3 C)  98.2 F (36.8 C) 98.4 F (36.9 C)  TempSrc:   Oral Oral  SpO2: 96%  97% 96%  Weight:  68.3 kg    Height:  '5\' 8"'$  (1.727 m)     RUE -in sling.  No edema, or ecchymosis to right shoulder. Bulging area at anterior shoulder, feels like lipoma, not TTP. Tender to palpation diffusely at lateral shoulder.  Full range of motion at right wrist and all fingers of right hand.  Distal sensation intact.  2+ radial pulse.   LUE -in sling and sugar-tong splint.  Able to flex and extend all fingers of the left hand.  Fingers warm and well-perfused.  Distal sensation intact.   RLE -small skin tear over anterior right knee.  No pain with active range of motion of the right knee.  Dorsiflexion plantarflexion intact.  Foot warm well perfused.  Distal sensation intact.   LLE -knee immobilizer in place.  Able to perform straight leg raise.  Dorsiflexion and plantarflexion intact.  Able to flex and extend all toes.  2+ DP pulse.  Mild tenderness palpation over the greater trochanter left hip. No ecchymosis at left hip. Leg lengths equal.  LABS  Results for orders placed or performed during the hospital encounter of 08/17/21 (from the past 24 hour(s))  Basic metabolic panel     Status: Abnormal   Collection Time: 08/17/21  5:53 PM  Result Value Ref Range   Sodium 137 135 - 145 mmol/L   Potassium 4.1 3.5 - 5.1 mmol/L   Chloride 101 98 - 111 mmol/L    CO2 25 22 - 32 mmol/L   Glucose, Bld 109 (H) 70 - 99 mg/dL   BUN 18 8 - 23 mg/dL   Creatinine, Ser 1.23 (H) 0.44 - 1.00 mg/dL   Calcium 9.4 8.9 - 10.3 mg/dL   GFR, Estimated 43 (L) >60 mL/min   Anion gap 11 5 - 15  CBC with Differential     Status: Abnormal   Collection Time: 08/17/21  5:53 PM  Result Value Ref Range   WBC 11.1 (H) 4.0 - 10.5 K/uL   RBC 4.57 3.87 - 5.11 MIL/uL   Hemoglobin 13.5 12.0 - 15.0 g/dL   HCT 42.0 36.0 - 46.0 %   MCV 91.9 80.0 - 100.0 fL   MCH 29.5 26.0 - 34.0 pg   MCHC 32.1 30.0 - 36.0 g/dL   RDW 13.8 11.5 - 15.5 %   Platelets 243 150 - 400 K/uL   nRBC 0.0 0.0 - 0.2 %   Neutrophils Relative % 80 %   Neutro Abs 9.1 (H) 1.7 - 7.7 K/uL   Lymphocytes Relative 11 %   Lymphs Abs 1.2 0.7 - 4.0 K/uL   Monocytes Relative 6 %   Monocytes Absolute 0.6 0.1 - 1.0 K/uL   Eosinophils Relative 1 %  Eosinophils Absolute 0.1 0.0 - 0.5 K/uL   Basophils Relative 1 %   Basophils Absolute 0.1 0.0 - 0.1 K/uL   Immature Granulocytes 1 %   Abs Immature Granulocytes 0.08 (H) 0.00 - 0.07 K/uL  Resp Panel by RT-PCR (Flu A&B, Covid) Nasopharyngeal Swab     Status: None   Collection Time: 08/17/21  5:53 PM   Specimen: Nasopharyngeal Swab; Nasopharyngeal(NP) swabs in vial transport medium  Result Value Ref Range   SARS Coronavirus 2 by RT PCR NEGATIVE NEGATIVE   Influenza A by PCR NEGATIVE NEGATIVE   Influenza B by PCR NEGATIVE NEGATIVE  CBC WITH DIFFERENTIAL     Status: None   Collection Time: 08/17/21 10:01 PM  Result Value Ref Range   WBC 9.4 4.0 - 10.5 K/uL   RBC 4.37 3.87 - 5.11 MIL/uL   Hemoglobin 13.0 12.0 - 15.0 g/dL   HCT 39.6 36.0 - 46.0 %   MCV 90.6 80.0 - 100.0 fL   MCH 29.7 26.0 - 34.0 pg   MCHC 32.8 30.0 - 36.0 g/dL   RDW 13.7 11.5 - 15.5 %   Platelets 223 150 - 400 K/uL   nRBC 0.0 0.0 - 0.2 %   Neutrophils Relative % 80 %   Neutro Abs 7.4 1.7 - 7.7 K/uL   Lymphocytes Relative 14 %   Lymphs Abs 1.4 0.7 - 4.0 K/uL   Monocytes Relative 6 %    Monocytes Absolute 0.6 0.1 - 1.0 K/uL   Eosinophils Relative 0 %   Eosinophils Absolute 0.0 0.0 - 0.5 K/uL   Basophils Relative 0 %   Basophils Absolute 0.0 0.0 - 0.1 K/uL   Immature Granulocytes 0 %   Abs Immature Granulocytes 0.04 0.00 - 0.07 K/uL  APTT     Status: None   Collection Time: 08/17/21 10:01 PM  Result Value Ref Range   aPTT 29 24 - 36 seconds  Protime-INR     Status: None   Collection Time: 08/17/21 10:01 PM  Result Value Ref Range   Prothrombin Time 12.8 11.4 - 15.2 seconds   INR 1.0 0.8 - 1.2  Magnesium     Status: None   Collection Time: 08/17/21 10:01 PM  Result Value Ref Range   Magnesium 2.1 1.7 - 2.4 mg/dL  Surgical pcr screen     Status: None   Collection Time: 08/18/21 12:25 AM   Specimen: Nasal Mucosa; Nasal Swab  Result Value Ref Range   MRSA, PCR NEGATIVE NEGATIVE   Staphylococcus aureus NEGATIVE NEGATIVE  VITAMIN D 25 Hydroxy (Vit-D Deficiency, Fractures)     Status: None   Collection Time: 08/18/21  4:30 AM  Result Value Ref Range   Vit D, 25-Hydroxy 36.65 30 - 100 ng/mL    DG Ribs Unilateral W/Chest Right  Result Date: 08/17/2021 CLINICAL DATA:  [place], pain EXAM: RIGHT RIBS AND CHEST - 3+ VIEW COMPARISON:  1/2/2 2 FINDINGS: Frontal view of the chest as well as frontal and oblique views of the right thoracic cage are obtained. Cardiac silhouette is unremarkable. No airspace disease, effusion, or pneumothorax. Comminuted impacted right humeral neck fracture is noted. No other acute bony abnormalities. IMPRESSION: 1. No acute intrathoracic process. 2. Comminuted impacted right humeral neck fracture. Electronically Signed   By: Randa Ngo M.D.   On: 08/17/2021 15:38   DG Pelvis 1-2 Views  Result Date: 08/17/2021 CLINICAL DATA:  [place], pain EXAM: PELVIS - 1-2 VIEW COMPARISON:  06/13/2014 FINDINGS: Single frontal view of the pelvis demonstrates  stable surgical clips within the right hemipelvis. No acute displaced fracture, subluxation, or dislocation.  Joint spaces are well preserved. Sacroiliac joints are normal. IMPRESSION: 1. No acute pelvic fracture. Electronically Signed   By: Randa Ngo M.D.   On: 08/17/2021 15:39   DG Shoulder Right  Result Date: 08/17/2021 CLINICAL DATA:  [place], right shoulder pain EXAM: RIGHT SHOULDER - 2+ VIEW COMPARISON:  06/13/2014 FINDINGS: Frontal and transscapular views of the right shoulder demonstrate a comminuted impacted right humeral neck fracture. No dislocation. 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 The right chest is clear. IMPRESSION: 1. Comminuted impacted right humeral neck fracture. Electronically Signed   By: Randa Ngo M.D.   On: 08/17/2021 15:36   DG Elbow Complete Left  Result Date: 08/17/2021 CLINICAL DATA:  [place], pain EXAM: LEFT ELBOW - COMPLETE 3+ VIEW COMPARISON:  None. FINDINGS: Frontal, bilateral oblique, and lateral views of the left elbow are obtained. No fracture, subluxation, or dislocation. No joint effusion. Soft tissues are unremarkable. IMPRESSION: 1. Unremarkable left elbow. Electronically Signed   By: Randa Ngo M.D.   On: 08/17/2021 15:41   DG Elbow Complete Right  Result Date: 08/17/2021 CLINICAL DATA:  Elbow pain. EXAM: RIGHT ELBOW - COMPLETE 3+ VIEW COMPARISON:  None. FINDINGS: There is medial elbow soft tissue swelling. There is a small density adjacent to the inferior medial aspect of the medial epicondyle. A small acute nondisplaced fracture fragment cannot be excluded. No other fractures are visualized. Joint spaces are well maintained and alignment is anatomic. Lateral view significantly limited. Cannot assess for joint effusion. IMPRESSION: 1. Cannot exclude small nondisplaced fracture medial epicondyle. There is overlying soft tissue swelling. 2. Electronically Signed   By: Ronney Asters M.D.   On: 08/17/2021 16:56   DG Wrist Complete Left  Result Date: 08/17/2021 CLINICAL DATA:  [place], left wrist pain EXAM: LEFT WRIST - COMPLETE 3+ VIEW  COMPARISON:  None. FINDINGS: Frontal, oblique, and lateral views of the left wrist are obtained. There is a comminuted intra-articular distal left radial fracture with mild displacement and volar angulation. The radiocarpal joint appears intact. The bones are diffusely osteopenic. There is mild soft tissue swelling. IMPRESSION: 1. Comminuted intra-articular distal left radial fracture, with minimal displacement and volar angulation. Electronically Signed   By: Randa Ngo M.D.   On: 08/17/2021 15:37   CT HEAD WO CONTRAST (5MM)  Result Date: 08/17/2021 CLINICAL DATA:  Trauma. EXAM: CT HEAD WITHOUT CONTRAST TECHNIQUE: Contiguous axial images were obtained from the base of the skull through the vertex without intravenous contrast. COMPARISON:  CT head 06/13/2014.  MRI brain 08/16/2021. FINDINGS: Brain: No evidence of acute infarction, hemorrhage, hydrocephalus, extra-axial collection or mass lesion/mass effect. Vascular: No hyperdense vessel or unexpected calcification. Skull: Normal. Negative for fracture or focal lesion. Sinuses/Orbits: No acute finding. Other: None. IMPRESSION: No acute intracranial abnormality. Electronically Signed   By: Ronney Asters M.D.   On: 08/17/2021 16:37   CT Cervical Spine Wo Contrast  Result Date: 08/17/2021 CLINICAL DATA:  Neck trauma. EXAM: CT CERVICAL SPINE WITHOUT CONTRAST TECHNIQUE: Multidetector CT imaging of the cervical spine was performed without intravenous contrast. Multiplanar CT image reconstructions were also generated. COMPARISON:  Cervical spine CT 06/13/2014. FINDINGS: Alignment: There is trace anterolisthesis at C3-C4 which is likely degenerative. Alignment is otherwise anatomic. Skull base and vertebrae: No acute fracture. No primary bone lesion or focal pathologic process. Soft tissues and spinal canal: No prevertebral fluid or swelling. No visible canal hematoma. Disc levels: There is disc space  narrowing and osteophyte formation compatible with  degenerative change at C5-C6 and C6-C7. There are degenerative changes of facet joints throughout the cervical spine, right greater than left. There is no severe central canal or neural foraminal stenosis at any level. Upper chest: Negative. Other: None. IMPRESSION: 1. No acute fracture or traumatic subluxation of the cervical spine. 2. Degenerative changes. Electronically Signed   By: Ronney Asters M.D.   On: 08/17/2021 16:35   CT Knee Left Wo Contrast  Result Date: 08/17/2021 CLINICAL DATA:  [place].  Tibial plateau fracture. EXAM: CT OF THE left KNEE WITHOUT CONTRAST TECHNIQUE: Multidetector CT imaging of the left knee was performed according to the standard protocol. Multiplanar CT image reconstructions were also generated. COMPARISON:  Radiographs, same date. FINDINGS: There is a lateral tibial plateau fracture but no significant displacement or depression. The medial tibial plateau is normal. No femur, patella or fibular fractures are identified. Associated moderate-sized joint effusion is noted. There is also a moderate-sized Baker's cyst. Grossly by CT the quadriceps and patellar tendons are intact and the medial and lateral collateral ligaments are intact. Age related vascular calcifications are noted. IMPRESSION: 1. Lateral tibial plateau fracture but no significant displacement or depression. 2. No femur, patella or fibular fractures are identified. 3. Associated moderate-sized joint effusion and Baker's cyst. Electronically Signed   By: Marijo Sanes M.D.   On: 08/17/2021 17:31   CT Shoulder Right Wo Contrast  Result Date: 08/17/2021 CLINICAL DATA:  [place].  Evaluate right humeral fracture. EXAM: CT OF THE UPPER RIGHT EXTREMITY WITHOUT CONTRAST TECHNIQUE: Multidetector CT imaging of the upper right extremity was performed according to the standard protocol. COMPARISON:  Radiographs, same date. FINDINGS: There is a minimally displaced and mildly impacted transverse fracture through the humeral neck.  There is also a nondisplaced longitudinal fracture through the greater tuberosity. Is also mildly comminuted fracture involving the lesser tuberosity. The glenoid is intact.  No scapular fracture. Moderate AC joint degenerative changes. The acromion is type 1-2 in shape. No significant lateral downsloping or subacromial spurring. Grossly by CT the rotator cuff tendons are intact. No obvious rotator cuff tear and no fatty atrophy of the rotator cuff muscles. There is a 6.1 cm complex fatty mass associated with the anterior shoulder musculature, mainly the anterior deltoid and pectoralis major. This is not a simple lipoma and is worrisome for a liposarcoma. MRI imaging without and with contrast may be helpful for further characterization and preoperative planning. The visualized right ribs are intact and the visualized right lung is grossly clear. There are advanced vascular calcifications involving the aorta. IMPRESSION: 1. Minimally displaced and mildly impacted transverse fracture through the humeral neck. 2. Nondisplaced longitudinal fracture through the greater tuberosity. 3. Mildly comminuted fracture involving the lesser tuberosity. 4. 6.1 cm complex fatty mass associated with the anterior shoulder musculature. This is not a simple lipoma and is worrisome for a liposarcoma. MRI imaging without and with contrast may be helpful for further characterization and preoperative planning. Patient may need referral to orthopedic oncologist. 5. Grossly by CT the rotator cuff tendons are intact. 6. Advanced vascular calcifications involving the aorta. 7. Aortic atherosclerosis. Aortic Atherosclerosis (ICD10-I70.0). Electronically Signed   By: Marijo Sanes M.D.   On: 08/17/2021 17:24   CT Elbow Right Wo Contrast  Result Date: 08/17/2021 CLINICAL DATA:  Evaluate for fracture. EXAM: CT OF THE UPPER RIGHT EXTREMITY WITHOUT CONTRAST TECHNIQUE: Multidetector CT imaging of the upper right extremity was performed according  to the standard protocol. COMPARISON:  Right elbow x-ray 08/17/2021. FINDINGS: Bones/Joint/Cartilage Bones are osteopenic. There is no acute fracture or dislocation. Inferiorly projecting osteophyte is seen from the medial epicondyle which may related to degenerative change or old injury. This correlates to findings on x-ray. The joint spaces are well maintained. Ligaments Suboptimally assessed by CT. Muscles and Tendons Grossly within normal limits on this noncontrast study. Soft tissues Small joint effusion. IMPRESSION: 1. No acute fracture or dislocation of the right elbow. 2. Small joint effusion. 3. Mild degenerative changes. Electronically Signed   By: Ronney Asters M.D.   On: 08/17/2021 19:08   DG Knee Complete 4 Views Left  Result Date: 08/17/2021 CLINICAL DATA:  Left knee pain after fall EXAM: LEFT KNEE - COMPLETE 4+ VIEW COMPARISON:  None. FINDINGS: Acute fracture of the lateral tibial plateau with mild depression. A joint effusion is present. Joint spaces are preserved. Vascular calcifications. IMPRESSION: Acute fracture of the lateral tibial plateau with mild depression. Joint effusion. Electronically Signed   By: Macy Mis M.D.   On: 08/17/2021 16:43    Assessment/Plan: Comminuted impacted right humeral neck fracture - sling at all times, will plan for nonoperatively treatment -  NWB RUE.  - will reserve anterior shoulder mass work up until she is outpatient   Left tibial plateau fracture - keep knee immobilizer in place at all times - NWB LLE   Left distal radius fracture: - current plan is for a distal radius ORIF with Dr. Mardelle Matte tomorrow  - NWB LUE - continue splint, can use sling for comfort   Right elbow pain - CT shows no fracture or dislocation   Left hip pain - no significant hip pain today  Back pain - patient and daughter stating patient is complaining of back pain, will get lumbar x-rays today   Therapy eval has been ordered, ok to pivot on RLE and sit in  chair. Will plan for ORIF tomorrow with Dr. Mardelle Matte, NPO after midnight. However, I am going to have repeat conversation with family regarding this this afternoon.  Patient is needing morphine for pain control, currently has PRN tylenol and morphine on board, will add on tramadol to use as needed q 6 hours. - ordered SCD's   Contact information:   Weekdays 8-[address], [place] [phone] A fter hours and holidays please check [email or web address] for group call information for [facility]  Ventura Bruns 08/18/2021, 9:49 AM
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
## 2021-08-19 ENCOUNTER — Inpatient Hospital Stay (HOSPITAL_COMMUNITY): Payer: Medicare Other | Admitting: Certified Registered Nurse Anesthetist

## 2021-08-19 ENCOUNTER — Encounter (HOSPITAL_COMMUNITY): Payer: Self-pay | Admitting: Internal Medicine

## 2021-08-19 ENCOUNTER — Encounter (HOSPITAL_COMMUNITY)
Admission: EM | Disposition: A | Discharge status: Skilled Nursing Facility | Payer: Self-pay | Source: Home / Self Care | Attending: Hospitalist

## 2021-08-19 DIAGNOSIS — J449 Chronic obstructive pulmonary disease, unspecified: Secondary | ICD-10-CM | POA: Diagnosis not present

## 2021-08-19 DIAGNOSIS — Y92009 Unspecified place in unspecified non-institutional (private) residence as the place of occurrence of the external cause: Secondary | ICD-10-CM | POA: Diagnosis not present

## 2021-08-19 DIAGNOSIS — I714 Abdominal aortic aneurysm, without rupture: Secondary | ICD-10-CM | POA: Diagnosis not present

## 2021-08-19 DIAGNOSIS — W19XXXA Unspecified fall, initial encounter: Secondary | ICD-10-CM | POA: Diagnosis not present

## 2021-08-19 HISTORY — PX: OPEN REDUCTION INTERNAL FIXATION (ORIF) DISTAL RADIAL FRACTURE: SHX5989

## 2021-08-19 LAB — MAGNESIUM: Magnesium: 1.7 mg/dL (ref 1.7–2.4)

## 2021-08-19 LAB — CBC
HCT: 35.9 % — ABNORMAL LOW (ref 36.0–46.0)
HCT: 37.8 % (ref 36.0–46.0)
Hemoglobin: 11.6 g/dL — ABNORMAL LOW (ref 12.0–15.0)
Hemoglobin: 12.1 g/dL (ref 12.0–15.0)
MCH: 29.9 pg (ref 26.0–34.0)
MCH: 29.5 pg (ref 26.0–34.0)
MCHC: 32.3 g/dL (ref 30.0–36.0)
MCHC: 32 g/dL (ref 30.0–36.0)
MCV: 92.5 fL (ref 80.0–100.0)
MCV: 92.2 fL (ref 80.0–100.0)
Platelets: 189 10*3/uL (ref 150–400)
Platelets: 190 10*3/uL (ref 150–400)
RBC: 3.88 MIL/uL (ref 3.87–5.11)
RBC: 4.1 MIL/uL (ref 3.87–5.11)
RDW: 13.6 % (ref 11.5–15.5)
RDW: 13.4 % (ref 11.5–15.5)
WBC: 5.4 10*3/uL (ref 4.0–10.5)
WBC: 7.2 10*3/uL (ref 4.0–10.5)
nRBC: 0 % (ref 0.0–0.2)
nRBC: 0 % (ref 0.0–0.2)

## 2021-08-19 LAB — CREATININE, SERUM
Creatinine, Ser: 1.18 mg/dL — ABNORMAL HIGH (ref 0.44–1.00)
GFR, Estimated: 45 mL/min — ABNORMAL LOW (ref >60)

## 2021-08-19 LAB — BASIC METABOLIC PANEL
Anion gap: 7 (ref 5–15)
BUN: 15 mg/dL (ref 8–23)
CO2: 30 mmol/L (ref 22–32)
Calcium: 9.2 mg/dL (ref 8.9–10.3)
Chloride: 101 mmol/L (ref 98–111)
Creatinine, Ser: 1.21 mg/dL — ABNORMAL HIGH (ref 0.44–1.00)
GFR, Estimated: 44 mL/min — ABNORMAL LOW (ref >60)
Glucose, Bld: 160 mg/dL — ABNORMAL HIGH (ref 70–99)
Potassium: 3.8 mmol/L (ref 3.5–5.1)
Sodium: 138 mmol/L (ref 135–145)

## 2021-08-19 SURGERY — OPEN REDUCTION INTERNAL FIXATION (ORIF) DISTAL RADIUS FRACTURE
Anesthesia: Monitor Anesthesia Care | Laterality: Left

## 2021-08-19 MED ORDER — CEFAZOLIN SODIUM-DEXTROSE 2-4 GM/100ML-% IV SOLN
2.0000 g | INTRAVENOUS | Status: AC
  Administered 2021-08-19: 2 g via INTRAVENOUS
  Filled 2021-08-19 (×2): qty 100

## 2021-08-19 MED ORDER — FENTANYL CITRATE (PF) 100 MCG/2ML IJ SOLN
INTRAMUSCULAR | Status: DC | PRN
  Administered 2021-08-19: 50 ug via INTRAVENOUS
  Administered 2021-08-19 (×2): 25 ug via INTRAVENOUS

## 2021-08-19 MED ORDER — PROPOFOL 500 MG/50ML IV EMUL
INTRAVENOUS | Status: AC
  Filled 2021-08-19: qty 50

## 2021-08-19 MED ORDER — DEXAMETHASONE SODIUM PHOSPHATE 10 MG/ML IJ SOLN
INTRAMUSCULAR | Status: AC
  Filled 2021-08-19: qty 1

## 2021-08-19 MED ORDER — ACETAMINOPHEN 500 MG PO TABS
ORAL_TABLET | ORAL | Status: AC
  Filled 2021-08-19: qty 2

## 2021-08-19 MED ORDER — DEXMEDETOMIDINE (PRECEDEX) IN NS 20 MCG/5ML (4 MCG/ML) IV SYRINGE
PREFILLED_SYRINGE | INTRAVENOUS | Status: DC | PRN
  Administered 2021-08-19 (×2): 4 ug via INTRAVENOUS
  Administered 2021-08-19: 8 ug via INTRAVENOUS

## 2021-08-19 MED ORDER — ACETAMINOPHEN 500 MG PO TABS
1000.0000 mg | ORAL_TABLET | Freq: Once | ORAL | Status: DC
  Filled 2021-08-19: qty 2

## 2021-08-19 MED ORDER — LACTATED RINGERS IV SOLN: INTRAVENOUS | Status: DC | PRN

## 2021-08-19 MED ORDER — 0.9 % SODIUM CHLORIDE (POUR BTL) OPTIME
TOPICAL | Status: DC | PRN
  Administered 2021-08-19: 1000 mL

## 2021-08-19 MED ORDER — BUPIVACAINE HCL (PF) 0.25 % IJ SOLN
INTRAMUSCULAR | Status: AC
  Filled 2021-08-19: qty 30

## 2021-08-19 MED ORDER — DEXMEDETOMIDINE (PRECEDEX) IN NS 20 MCG/5ML (4 MCG/ML) IV SYRINGE
PREFILLED_SYRINGE | INTRAVENOUS | Status: AC
  Filled 2021-08-19: qty 5

## 2021-08-19 MED ORDER — FENTANYL CITRATE (PF) 100 MCG/2ML IJ SOLN
INTRAMUSCULAR | Status: AC
  Filled 2021-08-19: qty 2

## 2021-08-19 MED ORDER — CHLORHEXIDINE GLUCONATE 4 % EX LIQD
60.0000 mL | Freq: Once | CUTANEOUS | Status: AC
  Administered 2021-08-19: 4 via TOPICAL
  Filled 2021-08-19: qty 60

## 2021-08-19 MED ORDER — ROPIVACAINE HCL 5 MG/ML IJ SOLN
INTRAMUSCULAR | Status: DC | PRN
  Administered 2021-08-19: 30 mL via PERINEURAL

## 2021-08-19 MED ORDER — CEFAZOLIN SODIUM-DEXTROSE 2-4 GM/100ML-% IV SOLN
2.0000 g | Freq: Four times a day (QID) | INTRAVENOUS | Status: AC
  Administered 2021-08-19 – 2021-08-20 (×3): 2 g via INTRAVENOUS
  Filled 2021-08-19 (×4): qty 100

## 2021-08-19 MED ORDER — ENSURE PRE-SURGERY PO LIQD
296.0000 mL | Freq: Once | ORAL | Status: AC
  Administered 2021-08-19: 296 mL via ORAL
  Filled 2021-08-19: qty 296

## 2021-08-19 MED ORDER — FENTANYL CITRATE PF 50 MCG/ML IJ SOSY: 50.0000 ug | PREFILLED_SYRINGE | INTRAMUSCULAR | Status: DC

## 2021-08-19 MED ORDER — PROPOFOL 500 MG/50ML IV EMUL: INTRAVENOUS | Status: DC | PRN

## 2021-08-19 MED ORDER — PROPOFOL 10 MG/ML IV BOLUS
INTRAVENOUS | Status: AC
  Filled 2021-08-19: qty 20

## 2021-08-19 MED ORDER — FENTANYL CITRATE PF 50 MCG/ML IJ SOSY
PREFILLED_SYRINGE | INTRAMUSCULAR | Status: AC
  Administered 2021-08-19: 50 ug via INTRAVENOUS
  Filled 2021-08-19: qty 1

## 2021-08-19 MED ORDER — PROPOFOL 10 MG/ML IV BOLUS
INTRAVENOUS | Status: DC | PRN
  Administered 2021-08-19: 10 mg via INTRAVENOUS

## 2021-08-19 MED ORDER — POVIDONE-IODINE 10 % EX SWAB: 2.0000 "application " | Freq: Once | CUTANEOUS | Status: DC

## 2021-08-19 MED ORDER — POTASSIUM CHLORIDE IN NACL 20-0.45 MEQ/L-% IV SOLN
INTRAVENOUS | Status: DC
  Filled 2021-08-19 (×2): qty 1000

## 2021-08-19 MED ORDER — FENTANYL CITRATE PF 50 MCG/ML IJ SOSY: 25.0000 ug | PREFILLED_SYRINGE | INTRAMUSCULAR | Status: DC | PRN

## 2021-08-19 MED ORDER — ONDANSETRON HCL 4 MG/2ML IJ SOLN
INTRAMUSCULAR | Status: AC
  Filled 2021-08-19: qty 2

## 2021-08-19 MED ORDER — PROPOFOL 500 MG/50ML IV EMUL
INTRAVENOUS | Status: DC | PRN
  Administered 2021-08-19: 25 ug/kg/min via INTRAVENOUS

## 2021-08-19 MED ORDER — ENOXAPARIN SODIUM 40 MG/0.4ML IJ SOSY
40.0000 mg | PREFILLED_SYRINGE | INTRAMUSCULAR | Status: DC
  Administered 2021-08-20 – 2021-08-22 (×2): 40 mg via SUBCUTANEOUS
  Filled 2021-08-19 (×3): qty 0.4

## 2021-08-19 SURGICAL SUPPLY — 60 items
BAG COUNTER SPONGE SURGICOUNT (BAG) ×4 IMPLANT
BIT DRILL 2.2 SS TIBIAL (BIT) ×2 IMPLANT
BLADE MINI RND TIP GREEN BEAV (BLADE) IMPLANT
BLADE SURG 15 STRL LF DISP TIS (BLADE) ×1 IMPLANT
BLADE SURG 15 STRL SS (BLADE) ×1
BNDG COHESIVE 4X5 TAN ST LF (GAUZE/BANDAGES/DRESSINGS) ×2 IMPLANT
BNDG ELASTIC 3X5.8 VLCR STR LF (GAUZE/BANDAGES/DRESSINGS) ×2 IMPLANT
BNDG ELASTIC 4X5.8 VLCR STR LF (GAUZE/BANDAGES/DRESSINGS) ×2 IMPLANT
BNDG ESMARK 4X9 LF (GAUZE/BANDAGES/DRESSINGS) ×2 IMPLANT
CORD BIPOLAR FORCEPS 12FT (ELECTRODE) ×2 IMPLANT
COVER BACK TABLE 60X90IN (DRAPES) ×2 IMPLANT
CUFF TOURN SGL QUICK 18X4 (TOURNIQUET CUFF) IMPLANT
DECANTER SPIKE VIAL GLASS SM (MISCELLANEOUS) ×2 IMPLANT
DRAPE C-ARM 42X120 X-RAY (DRAPES) ×2 IMPLANT
DRAPE EXTREMITY T 121X128X90 (DISPOSABLE) ×2 IMPLANT
DRAPE IMP U-DRAPE 54X76 (DRAPES) ×2 IMPLANT
DRAPE SURG 17X11 SM STRL (DRAPES) ×2 IMPLANT
DRAPE SURG 17X23 STRL (DRAPES) ×2 IMPLANT
DURAPREP 26ML APPLICATOR (WOUND CARE) ×2 IMPLANT
GAUZE SPONGE 4X4 12PLY STRL (GAUZE/BANDAGES/DRESSINGS) ×2 IMPLANT
GLOVE SRG 8 PF TXTR STRL LF DI (GLOVE) ×2 IMPLANT
GLOVE SURG ENC MOIS LTX SZ7 (GLOVE) ×2 IMPLANT
GLOVE SURG ORTHO LTX SZ7.5 (GLOVE) ×2 IMPLANT
GLOVE SURG UNDER POLY LF SZ7 (GLOVE) ×2 IMPLANT
GLOVE SURG UNDER POLY LF SZ8 (GLOVE) ×2
GOWN STRL REUS W/ TWL LRG LVL3 (GOWN DISPOSABLE) ×1 IMPLANT
GOWN STRL REUS W/ TWL XL LVL3 (GOWN DISPOSABLE) ×1 IMPLANT
GOWN STRL REUS W/TWL LRG LVL3 (GOWN DISPOSABLE) ×1
GOWN STRL REUS W/TWL XL LVL3 (GOWN DISPOSABLE) ×1
K-WIRE 1.6 (WIRE) ×3
K-WIRE FX5X1.6XNS BN SS (WIRE) ×3
KIT BASIN OR (CUSTOM PROCEDURE TRAY) ×2 IMPLANT
KWIRE FX5X1.6XNS BN SS (WIRE) ×3 IMPLANT
NEEDLE HYPO 25X1 1.5 SAFETY (NEEDLE) IMPLANT
NS IRRIG 1000ML POUR BTL (IV SOLUTION) ×2 IMPLANT
PACK GENERAL/GYN (CUSTOM PROCEDURE TRAY) ×2 IMPLANT
PAD CAST 3X4 CTTN HI CHSV (CAST SUPPLIES) IMPLANT
PAD CAST 4YDX4 CTTN HI CHSV (CAST SUPPLIES) ×2 IMPLANT
PADDING CAST COTTON 3X4 STRL (CAST SUPPLIES)
PADDING CAST COTTON 4X4 STRL (CAST SUPPLIES) ×2
PEG LOCKING SMOOTH 2.2X16 (Screw) ×2 IMPLANT
PEG LOCKING SMOOTH 2.2X20 (Screw) ×10 IMPLANT
PLATE BN MN NAR 41X22 CRSLCK (Plate) ×1 IMPLANT
PLATE CROSSLOCK NAR MINI LT (Plate) ×1 IMPLANT
SCREW  LP NL 2.7X15MM (Screw) ×1 IMPLANT
SCREW 2.7X14MM (Screw) ×1 IMPLANT
SCREW BN 14X2.7XNONLOCK 3 LD (Screw) ×1 IMPLANT
SCREW LOCK 14X2.7X 3 LD TPR (Screw) ×1 IMPLANT
SCREW LOCKING 2.7X14 (Screw) ×1 IMPLANT
SCREW LP NL 2.7X15MM (Screw) ×1 IMPLANT
STRIP CLOSURE SKIN 1/2X4 (GAUZE/BANDAGES/DRESSINGS) ×2 IMPLANT
SUCTION FRAZIER HANDLE 10FR (MISCELLANEOUS) ×1
SUCTION TUBE FRAZIER 10FR DISP (MISCELLANEOUS) ×1 IMPLANT
SUT NYLON 3 0 (SUTURE) ×4 IMPLANT
SUT VIC AB 2-0 SH 18 (SUTURE) ×2 IMPLANT
SUT VIC AB 3-0 SH 18 (SUTURE) ×2 IMPLANT
SYR BULB EAR ULCER 3OZ GRN STR (SYRINGE) ×2 IMPLANT
SYR CONTROL 10ML LL (SYRINGE) IMPLANT
TUBING CONNECTING 10 (TUBING) ×2 IMPLANT
UNDERPAD 30X36 HEAVY ABSORB (UNDERPADS AND DIAPERS) ×2 IMPLANT

## 2021-08-19 NOTE — Progress Notes (Signed)
Assisted Dr. Foster with left, ultrasound guided, supraclavicular block. Side rails up, monitors on throughout procedure. See vital signs in flow sheet. Tolerated Procedure well. ?

## 2021-08-19 NOTE — Anesthesia Preprocedure Evaluation (Signed)
Anesthesia Evaluation  Patient identified by MRN, date of birth, ID band Patient awake    Reviewed: Allergy & Precautions, NPO status , Patient's Chart, lab work & pertinent test results  Airway Mallampati: II  TM Distance: >3 FB Neck ROM: Full    Dental  (+) Edentulous Upper, Edentulous Lower   Pulmonary shortness of breath and with exertion, COPD,  COPD inhaler, former smoker,    Pulmonary exam normal breath sounds clear to auscultation       Cardiovascular hypertension, Pt. on medications pulmonary hypertension+ angina with exertion + CAD  Normal cardiovascular exam Rhythm:Regular Rate:Normal     Neuro/Psych  Neuromuscular disease negative psych ROS   GI/Hepatic negative GI ROS, Neg liver ROS,   Endo/Other  Hyperlipidemia  Renal/GU Renal InsufficiencyRenal disease  negative genitourinary   Musculoskeletal  (+) Arthritis , Osteoarthritis,  Fx left distal radius   Abdominal   Peds  Hematology negative hematology ROS (+)   Anesthesia Other Findings   Reproductive/Obstetrics                             Anesthesia Physical Anesthesia Plan  ASA: 3  Anesthesia Plan: Regional and MAC   Post-op Pain Management:    Induction:   PONV Risk Score and Plan: 3 and Treatment may vary due to age or medical condition and Propofol infusion  Airway Management Planned: Natural Airway and Simple Face Mask  Additional Equipment:   Intra-op Plan:   Post-operative Plan:   Informed Consent: I have reviewed the patients History and Physical, chart, labs and discussed the procedure including the risks, benefits and alternatives for the proposed anesthesia with the patient or authorized representative who has indicated his/her understanding and acceptance.       Plan Discussed with: CRNA and Anesthesiologist  Anesthesia Plan Comments:         Anesthesia Quick Evaluation
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
## 2021-08-19 NOTE — Progress Notes (Signed)
Patient seen and examined.  Plan for ORIF left distal radius.   The risks benefits and alternatives were discussed with the patient including but not limited to the risks of nonoperative treatment, versus surgical intervention including infection, bleeding, nerve injury, malunion, nonunion, the need for revision surgery, hardware prominence, hardware failure, the need for hardware removal, blood clots, cardiopulmonary complications, morbidity, mortality, among others, and they were willing to proceed.    Also discussed with daughter.   Johnny Bridge, MD

## 2021-08-19 NOTE — Anesthesia Procedure Notes (Signed)
Date/Time: 08/19/2021 1:49 PM Performed by: Sharlette Dense, CRNA Oxygen Delivery Method: Simple face mask

## 2021-08-19 NOTE — Progress Notes (Signed)
   PT Cancellation Note  Patient Details Name: Gina Bond MRN: [id number] DOB: [birth year]   Cancelled Treatment:    Reason Eval/Treat Not Completed: Patient at procedure or test/unavailable  Surgery on L radius. Will start tomorrow. Claretha Cooper 08/19/2021, 12:27 PM Tresa Endo PT Acute Rehabilitation Services Pager [phone] Office [phone]

## 2021-08-19 NOTE — Op Note (Signed)
08/17/2021 - 08/19/2021  2:56 PM  PATIENT:  Gina Bond    PRE-OPERATIVE DIAGNOSIS:  LEFT DISTAL RADIUS FRACTURE, intraarticular with displacement of the radial column  POST-OPERATIVE DIAGNOSIS:  Same  PROCEDURE:  ORIF DISTAL RADIUS FRACTURE, 2 PIECES  SURGEON:  Johnny Bridge, MD  PHYSICIAN ASSISTANT: Merlene Pulling, PA-C, present and scrubbed throughout the case, critical for completion in a timely fashion, and for retraction, instrumentation, and closure.  ANESTHESIA:   General  ESTIMATED BLOOD LOSS: minimal  PREOPERATIVE INDICATIONS:  Gina Bond is a  87 y.o. female with a diagnosis of LEFT DISTAL RADIUS FRACTURE who elected for surgical management due to fracture displacement.    The risks benefits and alternatives were discussed with the patient preoperatively including but not limited to the risks of infection, bleeding, nerve injury, cardiopulmonary complications, the need for revision surgery, tendon rupture, hardware prominence, hardware failure, nonunion, malunion, post-traumatic arthritis, regional pain syndrome, among others, and the patient was willing to proceed.  OPERATIVE IMPLANTS: Biomet DVR volar plate with 2 proximal cortical screws and multiple distal interlocking smooth pegs, using the short narrow plate.   OPERATIVE FINDINGS: Comminution of the distal radius fracture with unstable radial styloid including the scaphoid fossa  UNIQUE ASPECTS OF THE CASE:  I couldn't see very much of an extension into the volar surface, but I did reduce the radial styloid piece indirectly, and held it provisionally with a K wire.  The shaft screw fixation on the nonlocking screws were not robust, and so I did place a locking screw for additional fixation.  OPERATIVE PROCEDURE: The patient was brought to the operating room and placed in the supine position.  Regional anesthesia was administered. IV antibiotics were given. Time out was performed. The upper extremity was  prepped and draped in usual sterile fashion. The arm was elevated and exsanguinated and the tourniquet was inflated at 253m hg.    Volar approach to the distal radius was carried out, and the flexor carpi radialis was retracted radially. The radial artery was protected throughout the case.  I tissue planes were not as clean as I typically like them to be, but I was able to mobilize the muscular tissue and bring it on the ulnar side.  Deep dissection was carried down, and the pronator quadratus was elevated off of the radius.   I reduced the fracture indirectly using a crab claw on the shaft, and palpation on the distal segment.  I held this provisionally with a K wire, and C-arm used to confirm alignment.   I had restored height and inclination and then applied a volar plate. A K wire was used to confirm appropriate position of the plate, and once I was satisfied with the overall alignment I was able to secure the plate proximally with a cortical screw.   I then secured the fracture with multiple smooth interlocking pegs distally, and confirmed that none of these were in the joint, and none of these were penetrating the dorsal cortex. I also secured the plate proximally with one more cortical screw.  I placed an additional locking screw in order to augment the fixation because the screws did not feel as robust as I would like.  The wounds were irrigated copiously, and I used 3-0 subcutaneous Vicryl for the skin and Steri-Strips and sterile gauze and a volar splint. The tourniquet was released. She was awakened and returned back in stable and satisfactory condition. There were no complications and She tolerated the procedure well.  3 views of the left wrist taken postoperatively demonstrated anatomic alignment.

## 2021-08-19 NOTE — Anesthesia Postprocedure Evaluation (Signed)
Anesthesia Post Note  Patient: Gina Bond  Procedure(s) Performed: OPEN REDUCTION INTERNAL FIXATION (ORIF) DISTAL RADIAL FRACTURE (Left)     Patient location during evaluation: PACU Anesthesia Type: Regional Level of consciousness: awake and alert Pain management: pain level controlled Vital Signs Assessment: post-procedure vital signs reviewed and stable Respiratory status: spontaneous breathing, nonlabored ventilation, respiratory function stable and patient connected to nasal cannula oxygen Cardiovascular status: stable and blood pressure returned to baseline Postop Assessment: no apparent nausea or vomiting Anesthetic complications: no   No notable events documented.  Last Vitals:  Vitals:   08/19/21 1545 08/19/21 1600  BP: 130/68 139/76  Pulse: 71 70  Resp: 13 13  Temp:    SpO2: 91% 93%    Last Pain:  Vitals:   08/19/21 1530  TempSrc:   PainSc: Asleep                 Effie Berkshire

## 2021-08-19 NOTE — Anesthesia Procedure Notes (Signed)
Anesthesia Regional Block: Supraclavicular block   Pre-Anesthetic Checklist: , timeout performed,  Correct Patient, Correct Site, Correct Laterality,  Correct Procedure, Correct Position, site marked,  Risks and benefits discussed,  Surgical consent,  Pre-op evaluation,  At surgeon's request and post-op pain management  Laterality: Left  Prep: chloraprep       Needles:  Injection technique: Single-shot  Needle Type: Echogenic Stimulator Needle     Needle Length: 10cm  Needle Gauge: 21   Needle insertion depth: 4 cm   Additional Needles:   Procedures:,,,, ultrasound used (permanent image in chart),,    Narrative:  Start time: 08/19/2021 12:23 PM End time: 08/19/2021 12:28 PM Injection made incrementally with aspirations every 5 mL.  Performed by: Personally  Anesthesiologist: Josephine Igo, MD  Additional Notes: Timeout performed. Patient sedated. Relevant anatomy ID'd using [place]. Incremental 2-43m injection of LA with frequent aspiration. Patient tolerated procedure well.    Left SupraClavicular Block

## 2021-08-19 NOTE — Transfer of Care (Signed)
Immediate Anesthesia Transfer of Care Note  Patient: Gina Bond  Procedure(s) Performed: OPEN REDUCTION INTERNAL FIXATION (ORIF) DISTAL RADIAL FRACTURE (Left)  Patient Location: PACU  Anesthesia Type:Regional  Level of Consciousness: awake, alert  and oriented  Airway & Oxygen Therapy: Patient Spontanous Breathing and Patient connected to face mask oxygen  Post-op Assessment: Report given to RN and Post -op Vital signs reviewed and stable  Post vital signs: Reviewed and stable  Last Vitals:  Vitals Value Taken Time  BP 131/71 08/19/21 1525  Temp    Pulse 66 08/19/21 1527  Resp 16 08/19/21 1527  SpO2 99 % 08/19/21 1527  Vitals shown include unvalidated device data.  Last Pain:  Vitals:   08/19/21 1030  TempSrc:   PainSc: 0-No pain         Complications: No notable events documented.

## 2021-08-20 DIAGNOSIS — W19XXXA Unspecified fall, initial encounter: Secondary | ICD-10-CM | POA: Diagnosis not present

## 2021-08-20 DIAGNOSIS — Y92009 Unspecified place in unspecified non-institutional (private) residence as the place of occurrence of the external cause: Secondary | ICD-10-CM | POA: Diagnosis not present

## 2021-08-20 DIAGNOSIS — J449 Chronic obstructive pulmonary disease, unspecified: Secondary | ICD-10-CM | POA: Diagnosis not present

## 2021-08-20 DIAGNOSIS — I714 Abdominal aortic aneurysm, without rupture: Secondary | ICD-10-CM | POA: Diagnosis not present

## 2021-08-20 LAB — CBC
HCT: 32.2 % — ABNORMAL LOW (ref 36.0–46.0)
Hemoglobin: 10.6 g/dL — ABNORMAL LOW (ref 12.0–15.0)
MCH: 29.7 pg (ref 26.0–34.0)
MCHC: 32.9 g/dL (ref 30.0–36.0)
MCV: 90.2 fL (ref 80.0–100.0)
Platelets: 178 10*3/uL (ref 150–400)
RBC: 3.57 MIL/uL — ABNORMAL LOW (ref 3.87–5.11)
RDW: 13.2 % (ref 11.5–15.5)
WBC: 6.3 10*3/uL (ref 4.0–10.5)
nRBC: 0 % (ref 0.0–0.2)

## 2021-08-20 LAB — BASIC METABOLIC PANEL
Anion gap: 9 (ref 5–15)
BUN: 15 mg/dL (ref 8–23)
CO2: 28 mmol/L (ref 22–32)
Calcium: 8.8 mg/dL — ABNORMAL LOW (ref 8.9–10.3)
Chloride: 100 mmol/L (ref 98–111)
Creatinine, Ser: 1.05 mg/dL — ABNORMAL HIGH (ref 0.44–1.00)
GFR, Estimated: 52 mL/min — ABNORMAL LOW (ref >60)
Glucose, Bld: 137 mg/dL — ABNORMAL HIGH (ref 70–99)
Potassium: 4.2 mmol/L (ref 3.5–5.1)
Sodium: 137 mmol/L (ref 135–145)

## 2021-08-20 LAB — MAGNESIUM: Magnesium: 1.9 mg/dL (ref 1.7–2.4)

## 2021-08-20 MED ORDER — HYDROCORTISONE 1 % EX CREA
TOPICAL_CREAM | CUTANEOUS | Status: DC | PRN
  Filled 2021-08-20: qty 28

## 2021-08-20 NOTE — TOC Initial Note (Signed)
Transition of Care [facility]) - Initial/Assessment Note    Patient Details  Name: Gina Bond MRN: [id number] Date of Birth: 11/01/1932  [facility] of [facility]) CM/SW Contact:    Lynnell Catalan, RN Phone Number: 08/20/2021, 1:04 PM  Clinical Narrative:                 Pt from home alone. Now recommended for SNF. Permission received to fax out FL2 to area SNF facilities for rehab. TOC will follow up with SNF bed offers when they are available.   Expected Discharge Plan: [place] Barriers to Discharge: Continued Medical Work up    Expected Discharge Plan and Services Expected Discharge Plan: [place]   Discharge Planning Services: CM Consult   Living arrangements for the past 2 months: Apartment                   Prior Living Arrangements/Services Living arrangements for the past 2 months: Apartment Lives with:: Self Patient language and need for interpreter reviewed:: Yes        Need for Family Participation in Patient Care: Yes (Comment) Care giver support system in place?: Yes (comment)   Criminal Activity/Legal Involvement Pertinent to Current Situation/Hospitalization: No - Comment as needed  Activities of Daily Living Home Assistive Devices/Equipment: Eyeglasses, Hearing aid, Walker (specify type) (bilateral hearing aides but doesn't wear them) ADL Screening (condition at time of admission) Patient's cognitive ability adequate to safely complete daily activities?: Yes Is the patient deaf or have difficulty hearing?: No Does the patient have difficulty seeing, even when wearing glasses/contacts?: Yes (macular degeneration in left eye - Dr Brigitte Pulse from [facility] - gets eye injections) Does the patient have difficulty concentrating, remembering, or making decisions?: No Patient able to express need for assistance with ADLs?: Yes Does the patient have difficulty dressing or bathing?: Yes Independently performs ADLs?:  No Communication: Independent Dressing (OT): Needs assistance Is this a change from baseline?: Change from baseline, expected to last >3 days Grooming: Needs assistance Is this a change from baseline?: Change from baseline, expected to last >3 days Feeding: Needs assistance Is this a change from baseline?: Change from baseline, expected to last >3 days Bathing: Needs assistance Is this a change from baseline?: Change from baseline, expected to last >3 days Toileting: Needs assistance Is this a change from baseline?: Change from baseline, expected to last >3days In/Out Bed: Needs assistance Is this a change from baseline?: Change from baseline, expected to last >3 days Walks in Home: Needs assistance Is this a change from baseline?: Change from baseline, expected to last >3 days Does the patient have difficulty walking or climbing stairs?: Yes Weakness of Legs: Left Weakness of Arms/Hands: Both  Permission Sought/Granted                  Emotional Assessment Appearance:: Appears stated age     Orientation: : Oriented to Self, Oriented to Place, Oriented to  Time, Oriented to Situation Alcohol / Substance Use: Not Applicable Psych Involvement: No (comment)  Admission diagnosis:  Fall [W19.XXXA] Fall at home, initial encounter [phone].Merril Abbe, [id number] Patient Active Problem List   Diagnosis Date Noted   Fall at home, initial encounter 08/17/2021   Pulmonary hypertension with chronic cor pulmonale ([place]) 08/17/2021   Closed fracture of anatomical neck of humerus, right, initial encounter 08/17/2021   Unspecified fracture of the lower end of left radius, initial encounter for closed fracture 08/17/2021   Closed fracture of left tibial  plateau 08/17/2021   Closed fracture of medial epicondyle of right humerus 08/17/2021   Basal cell carcinoma (BCC) of skin of nose 07/07/2020   Facial swelling 07/07/2020   AAA (abdominal aortic aneurysm) without rupture (HCC) 09/04/2018   Chronic  constipation 06/15/2017   Diarrhea 03/17/2017   Right hip pain 05/26/2016   Hypoxia    Enteritis 12/06/2015   Dehydration 12/06/2015   Coronary artery disease involving native coronary artery of native heart without angina pectoris 12/06/2015   SBO (small bowel obstruction) ([place])    Renal failure (ARF), acute on chronic (HCC)    Back pain    Dyspnea    Small bowel obstruction (HCC)    Partial small bowel obstruction ([place]) 07/24/2015   Ileitis 07/24/2015   Chronic kidney disease, stage 3b ([place]) 07/24/2015   Breast pain, left 04/13/2015   Essential hypertension 12/10/2014   COPD exacerbation ([place]) 12/10/2014   Unstable angina ([place]) 12/09/2014   Cramp of both lower extremities 08/18/2014   Mixed hyperlipidemia 03/10/2014   Pain in joint, pelvic region and thigh 03/10/2014   COPD (chronic obstructive pulmonary disease) ([place]) 09/13/2013   Osteoarthrosis 08/09/2013   Disorder of kidney and ureter 07/16/2013   Thoracic or lumbosacral neuritis or radiculitis 07/16/2013   PCP:  [name], MD Pharmacy:   [facility], [place] to [address] [employer] [address] [place] [address] Phone: [phone] Fax: [facility] [phone] - [address], [place] AT [facility] [place] [address] Phone: [phone] Fax: [phone]     Social Determinants of Health ([place]) Interventions    Readmission Risk Interventions Readmission Risk Prevention Plan 08/20/2021  Transportation Screening Complete  PCP or Specialist Appt within 5-7 Days Complete  Home Care Screening Complete  Medication Review (RN CM) Complete  Some recent data might be hidden

## 2021-08-20 NOTE — Progress Notes (Signed)
The patient is injury-free, afebrile, alert, and oriented X 4. Vital signs were within the baseline during this shift. She complained of generalized pain, which was controlled with current pain regiment. Pt denies chest pain, SOB, nausea, vomiting, dizziness, signs or symptoms of bleeding or infection or acute changes during this shift. We will continue to monitor and work toward achieving the care plan goals.
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
## 2021-08-20 NOTE — Progress Notes (Signed)
Subjective: 1 Day Post-Op s/p Procedure(s): OPEN REDUCTION INTERNAL FIXATION (ORIF) DISTAL RADIAL FRACTURE   Patient is alert, oriented. Sitting on side of bed, about to work with physical therapy to get to bedside commode, feeling like she needs to have a bowel movement. Denies nausea and vomiting.      Objective:  PE: VITALS:   Vitals:   08/19/21 1600 08/19/21 1646 08/19/21 2014 08/20/21 0531  BP: 139/76 (!) 150/80 137/65 126/60  Pulse: 70 71 66 (!) 58  Resp: '13  18 20  '$ Temp:   98.5 F (36.9 C) 98.1 F (36.7 C)  TempSrc:    Oral  SpO2: 93% 94% 93% 96%  Weight:      Height:       RUE -in sling.  No edema, or ecchymosis to right shoulder. Bulging area at anterior shoulder, feels like lipoma, not TTP. Full range of motion at right wrist and all fingers of right hand.  Distal sensation intact.  2+ radial pulse.   LUE -in sling and sugar-tong splint.  Able to flex and extend all fingers of the left hand.  Fingers warm and well-perfused.  Distal sensation intact.   LLE -knee immobilizer in place.  Able to perform straight leg raise.  Dorsiflexion and plantarflexion intact.  Able to flex and extend all toes.  2+ DP pulse.    LABS  Results for orders placed or performed during the hospital encounter of 08/17/21 (from the past 24 hour(s))  CBC     Status: None   Collection Time: 08/19/21  5:50 PM  Result Value Ref Range   WBC 7.2 4.0 - 10.5 K/uL   RBC 4.10 3.87 - 5.11 MIL/uL   Hemoglobin 12.1 12.0 - 15.0 g/dL   HCT 37.8 36.0 - 46.0 %   MCV 92.2 80.0 - 100.0 fL   MCH 29.5 26.0 - 34.0 pg   MCHC 32.0 30.0 - 36.0 g/dL   RDW 13.4 11.5 - 15.5 %   Platelets 190 150 - 400 K/uL   nRBC 0.0 0.0 - 0.2 %  Creatinine, serum     Status: Abnormal   Collection Time: 08/19/21  5:50 PM  Result Value Ref Range   Creatinine, Ser 1.18 (H) 0.44 - 1.00 mg/dL   GFR, Estimated 45 (L) >60 mL/min  Basic metabolic panel     Status: Abnormal   Collection Time: 08/20/21  4:37 AM  Result Value  Ref Range   Sodium 137 135 - 145 mmol/L   Potassium 4.2 3.5 - 5.1 mmol/L   Chloride 100 98 - 111 mmol/L   CO2 28 22 - 32 mmol/L   Glucose, Bld 137 (H) 70 - 99 mg/dL   BUN 15 8 - 23 mg/dL   Creatinine, Ser 1.05 (H) 0.44 - 1.00 mg/dL   Calcium 8.8 (L) 8.9 - 10.3 mg/dL   GFR, Estimated 52 (L) >60 mL/min   Anion gap 9 5 - 15  CBC     Status: Abnormal   Collection Time: 08/20/21  4:37 AM  Result Value Ref Range   WBC 6.3 4.0 - 10.5 K/uL   RBC 3.57 (L) 3.87 - 5.11 MIL/uL   Hemoglobin 10.6 (L) 12.0 - 15.0 g/dL   HCT 32.2 (L) 36.0 - 46.0 %   MCV 90.2 80.0 - 100.0 fL   MCH 29.7 26.0 - 34.0 pg   MCHC 32.9 30.0 - 36.0 g/dL   RDW 13.2 11.5 - 15.5 %   Platelets 178 150 -  400 K/uL   nRBC 0.0 0.0 - 0.2 %  Magnesium     Status: None   Collection Time: 08/20/21  4:37 AM  Result Value Ref Range   Magnesium 1.9 1.7 - 2.4 mg/dL    DG Lumbar Spine 2-3 Views  Result Date: 08/18/2021 CLINICAL DATA:  Lower back pain. EXAM: LUMBAR SPINE - 2-3 VIEW COMPARISON:  None. FINDINGS: There is no evidence of lumbar spine fracture. Alignment is normal. Intervertebral disc spaces are maintained. IMPRESSION: Negative. Electronically Signed   By: Marijo Conception M.D.   On: 08/18/2021 14:12    Assessment/Plan: Comminuted impacted right humeral neck fracture - sling at all times, will plan for nonoperatively treatment -  NWB RUE.  - will reserve anterior shoulder mass work up until she is outpatient   Left tibial plateau fracture - keep knee immobilizer in place at all times - NWB LLE   Left distal radius fracture: - s/p L distal radius ORIF 08/20/21 - continue splint, can use sling for comfort - NWB L wrist and hand, ok to bear weight through elbow   Right elbow pain - CT shows no fracture or dislocation   Left hip pain - no significant hip pain today   Back pain - x-rays negative, no back pain today   PT and OT evals have been ordered, ok to pivot on RLE, ok to weight bear through left elbow.   SCD's and Lovenox for DVT prophylaxis    Contact information:   Weekdays 8-5 Merlene Pulling, [place] [phone] A fter hours and holidays please check [email or web address] for group call information for [facility]  Ventura Bruns 08/20/2021, 7:52 AM

## 2021-08-20 NOTE — Plan of Care (Signed)

## 2021-08-20 NOTE — NC FL2 (Signed)
[place] LEVEL OF CARE SCREENING TOOL     IDENTIFICATION  Patient Name: Gina Bond Birthdate: 09/29/1932 Sex: female Admission Date (Current Location): 08/17/2021  [facility] and [place] Number:  Herbalist and Address:  [facility],  [place], [place]      Provider Number: [id number]  Attending Physician Name and Address:  Enzo Bi, MD  Relative Name and Phone Number:       Current Level of Care: Hospital Recommended Level of Care: [place] Prior Approval Number:    Date Approved/Denied:   PASRR Number: [id number]  Discharge Plan: SNF    Current Diagnoses: Patient Active Problem List   Diagnosis Date Noted   Fall at home, initial encounter 08/17/2021   Pulmonary hypertension with chronic cor pulmonale ([place]) 08/17/2021   Closed fracture of anatomical neck of humerus, right, initial encounter 08/17/2021   Unspecified fracture of the lower end of left radius, initial encounter for closed fracture 08/17/2021   Closed fracture of left tibial plateau 08/17/2021   Closed fracture of medial epicondyle of right humerus 08/17/2021   Basal cell carcinoma (BCC) of skin of nose 07/07/2020   Facial swelling 07/07/2020   AAA (abdominal aortic aneurysm) without rupture ([place]) 09/04/2018   Chronic constipation 06/15/2017   Diarrhea 03/17/2017   Right hip pain 05/26/2016   Hypoxia    Enteritis 12/06/2015   Dehydration 12/06/2015   Coronary artery disease involving native coronary artery of native heart without angina pectoris 12/06/2015   SBO (small bowel obstruction) (HCC)    Renal failure (ARF), acute on chronic (HCC)    Back pain    Dyspnea    Small bowel obstruction (HCC)    Partial small bowel obstruction ([place]) 07/24/2015   Ileitis 07/24/2015   Chronic kidney disease, stage 3b ([place]) 07/24/2015   Breast pain, left 04/13/2015   Essential hypertension 12/10/2014   COPD exacerbation ([place])  12/10/2014   Unstable angina ([place]) 12/09/2014   Cramp of both lower extremities 08/18/2014   Mixed hyperlipidemia 03/10/2014   Pain in joint, pelvic region and thigh 03/10/2014   COPD (chronic obstructive pulmonary disease) ([place]) 09/13/2013   Osteoarthrosis 08/09/2013   Disorder of kidney and ureter 07/16/2013   Thoracic or lumbosacral neuritis or radiculitis 07/16/2013    Orientation RESPIRATION BLADDER Height & Weight     Self, Time, Situation, Place  Normal Continent Weight: 68.3 kg Height:  '5\' 8"'$  (172.7 cm)  BEHAVIORAL SYMPTOMS/MOOD NEUROLOGICAL BOWEL NUTRITION STATUS      Incontinent Diet  AMBULATORY STATUS COMMUNICATION OF NEEDS Skin   Extensive Assist Verbally Surgical wounds                       Personal Care Assistance Level of Assistance  Bathing, Dressing, Feeding Bathing Assistance: Maximum assistance Feeding assistance: Limited assistance Dressing Assistance: Maximum assistance     Functional Limitations Info  Sight, Hearing, Speech Sight Info: Impaired Hearing Info: Impaired Speech Info: Adequate    SPECIAL CARE FACTORS FREQUENCY  PT (By licensed PT), OT (By licensed OT)     PT Frequency: 5 x weekly OT Frequency: 5 x weekly            Contractures Contractures Info: Not present    Additional Factors Info  Code Status, Allergies Code Status Info: Full Allergies Info: Benicar (Olmesartan), Fenofibrate, Other, Sulfa Antibiotics, Sulfacetamide Sodium, Atorvastatin, Isosorbide Dinitrate, Magnesium, Ondansetron, Rosuvastatin Calcium, Spironolactone, Statins, Sulfamethoxazole, Tobramycin  Current Medications (08/20/2021):  This is the current hospital active medication list Current Facility-Administered Medications  Medication Dose Route Frequency Provider Last Rate Last Admin   0.45 % NaCl with KCl 20 mEq / L infusion   Intravenous Continuous Ventura Bruns, PA-C 75 mL/hr at 08/20/21 0207 New Bag at 08/20/21 0207   acetaminophen  (TYLENOL) tablet 650 mg  650 mg Oral Q6H PRN Ventura Bruns, PA-C   650 mg at 08/18/21 [id number]   Or   acetaminophen (TYLENOL) suppository 650 mg  650 mg Rectal Q6H PRN Merlene Pulling K, PA-C       acetaminophen (TYLENOL) tablet 650 mg  650 mg Oral BID Ventura Bruns, PA-C   650 mg at 08/20/21 1132   albuterol (PROVENTIL) (2.5 MG/3ML) 0.083% nebulizer solution 2.5 mg  2.5 mg Inhalation Q6H PRN Merlene Pulling K, PA-C       diphenhydrAMINE (BENADRYL) capsule 25 mg  25 mg Oral Q6H PRN Merlene Pulling K, PA-C   25 mg at 08/20/21 0331   enalaprilat (VASOTEC) injection 0.625 mg  0.625 mg Intravenous Q6H PRN Merlene Pulling K, PA-C       enoxaparin (LOVENOX) injection 40 mg  40 mg Subcutaneous Q24H Brown, Blaine K, PA-C   40 mg at 08/20/21 1132   fenofibrate tablet 160 mg  160 mg Oral QHS Ventura Bruns, PA-C   160 mg at 08/19/21 2204   furosemide (LASIX) tablet 20 mg  20 mg Oral Juan Quam, Blaine K, PA-C   20 mg at 08/19/21 1702   hydrALAZINE (APRESOLINE) tablet 50 mg  50 mg Oral TID Merlene Pulling K, PA-C   50 mg at 08/20/21 1131   HYDROcodone-acetaminophen (NORCO/VICODIN) 5-325 MG per tablet 1-2 tablet  1-2 tablet Oral Q4H PRN Merlene Pulling K, PA-C   2 tablet at 08/20/21 1208   hydrocortisone cream 1 %   Topical PRN Kathryne Eriksson, NP       isosorbide mononitrate (IMDUR) 24 hr tablet 120 mg  120 mg Oral QHS Merlene Pulling K, PA-C   120 mg at 08/19/21 2136   morphine 2 MG/ML injection 2 mg  2 mg Intravenous Q4H PRN Merlene Pulling K, PA-C   2 mg at 08/20/21 0215   nitroGLYCERIN (NITROSTAT) SL tablet 0.4 mg  0.4 mg Sublingual Q5 min PRN Merlene Pulling K, PA-C       ondansetron [facility]) tablet 4 mg  4 mg Oral Q6H PRN Merlene Pulling K, PA-C       Or   ondansetron [facility]) injection 4 mg  4 mg Intravenous Q6H PRN Owens Shark, Blaine K, PA-C       pantoprazole (PROTONIX) EC tablet 40 mg  40 mg Oral Daily Merlene Pulling K, PA-C   40 mg at 08/20/21 1131   polyethylene glycol (MIRALAX / GLYCOLAX) packet 17 g  17 g Oral QHS  Merlene Pulling K, PA-C   17 g at 08/19/21 2135   polyvinyl alcohol (LIQUIFILM TEARS) 1.4 % ophthalmic solution 1 drop  1 drop Both Eyes BID Merlene Pulling K, PA-C   1 drop at 08/20/21 1133   pravastatin (PRAVACHOL) tablet 40 mg  40 mg Oral q1800 Merlene Pulling K, PA-C   40 mg at 08/19/21 1708   senna (SENOKOT) tablet 17.2 mg  2 tablet Oral QHS Merlene Pulling K, PA-C   17.2 mg at 08/19/21 2136   traMADol (ULTRAM) tablet 50 mg  50 mg Oral Q6H PRN Merlene Pulling K, PA-C   50 mg at  08/20/21 0124   verapamil (CALAN-SR) CR tablet 240 mg  240 mg Oral BID Ventura Bruns, PA-C   240 mg at 08/20/21 1131     Discharge Medications: Please see discharge summary for a list of discharge medications.  Relevant Imaging Results:  Relevant Lab Results:   Additional Information SS# [id number]  Covid vaccinations x 2  Evin Loiseau, Marjie Skiff, RN
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
## 2021-08-21 DIAGNOSIS — W19XXXA Unspecified fall, initial encounter: Secondary | ICD-10-CM | POA: Diagnosis not present

## 2021-08-21 DIAGNOSIS — J449 Chronic obstructive pulmonary disease, unspecified: Secondary | ICD-10-CM | POA: Diagnosis not present

## 2021-08-21 DIAGNOSIS — I714 Abdominal aortic aneurysm, without rupture: Secondary | ICD-10-CM | POA: Diagnosis not present

## 2021-08-21 DIAGNOSIS — Y92009 Unspecified place in unspecified non-institutional (private) residence as the place of occurrence of the external cause: Secondary | ICD-10-CM | POA: Diagnosis not present

## 2021-08-21 MED ORDER — QUETIAPINE FUMARATE 100 MG PO TABS
100.0000 mg | ORAL_TABLET | Freq: Every day | ORAL | Status: DC
  Administered 2021-08-21 – 2021-08-23 (×3): 100 mg via ORAL
  Filled 2021-08-21 (×3): qty 1

## 2021-08-21 MED ORDER — HYDRALAZINE HCL 20 MG/ML IJ SOLN: 10.0000 mg | Freq: Four times a day (QID) | INTRAMUSCULAR | Status: DC | PRN

## 2021-08-21 MED ORDER — HYDROXYZINE HCL 50 MG/ML IM SOLN
50.0000 mg | Freq: Once | INTRAMUSCULAR | Status: DC
  Filled 2021-08-21: qty 1

## 2021-08-21 MED ORDER — HALOPERIDOL LACTATE 5 MG/ML IJ SOLN
2.0000 mg | Freq: Once | INTRAMUSCULAR | Status: AC
  Administered 2021-08-21: 2 mg via INTRAMUSCULAR
  Filled 2021-08-21: qty 1

## 2021-08-21 MED ORDER — HALOPERIDOL LACTATE 5 MG/ML IJ SOLN: 2.0000 mg | Freq: Once | INTRAMUSCULAR | Status: DC

## 2021-08-21 MED ORDER — HYDROXYZINE HCL 50 MG/ML IM SOLN
25.0000 mg | Freq: Once | INTRAMUSCULAR | Status: DC
  Filled 2021-08-21: qty 0.5

## 2021-08-21 MED ORDER — HYDRALAZINE HCL 50 MG PO TABS
100.0000 mg | ORAL_TABLET | Freq: Three times a day (TID) | ORAL | Status: DC
  Administered 2021-08-21 (×2): 100 mg via ORAL
  Filled 2021-08-21 (×2): qty 2

## 2021-08-21 MED ORDER — QUETIAPINE FUMARATE 25 MG PO TABS: 50.0000 mg | ORAL_TABLET | Freq: Every day | ORAL | Status: DC

## 2021-08-21 MED ORDER — HALOPERIDOL LACTATE 5 MG/ML IJ SOLN: 5.0000 mg | Freq: Three times a day (TID) | INTRAMUSCULAR | Status: DC | PRN

## 2021-08-21 MED ORDER — HALOPERIDOL LACTATE 5 MG/ML IJ SOLN: 2.0000 mg | Freq: Every evening | INTRAMUSCULAR | Status: DC | PRN

## 2021-08-21 MED ORDER — TRAZODONE HCL 50 MG PO TABS: 50.0000 mg | ORAL_TABLET | Freq: Every evening | ORAL | Status: DC | PRN

## 2021-08-21 NOTE — Progress Notes (Signed)
Orthopedic Tech Progress Note Patient Details:  Gina Bond [birth year] [id number]  Patient ID: [name], female   DOB: Apr 10, 1932, 87 y.o.   MRN: [id number]  [place] Pink 08/21/2021, 12:58 PMCalled and Routed Bledsoe brace locked at zero order to [employer]

## 2021-08-21 NOTE — Plan of Care (Signed)
  Problem: Education: Goal: Knowledge of General Education information will improve Description: Including pain rating scale, medication(s)/side effects and non-pharmacologic comfort measures Outcome: Not Progressing   Problem: Health Behavior/Discharge Planning: Goal: Ability to manage health-related needs will improve Outcome: Not Progressing   Problem: Clinical Measurements: Goal: Ability to maintain clinical measurements within normal limits will improve Outcome: Progressing Goal: Will remain free from infection Outcome: Progressing Goal: Diagnostic test results will improve Outcome: Progressing Goal: Respiratory complications will improve Outcome: Progressing Goal: Cardiovascular complication will be avoided Outcome: Progressing   Problem: Elimination: Goal: Will not experience complications related to bowel motility Outcome: Progressing Goal: Will not experience complications related to urinary retention Outcome: Progressing   Problem: Pain Managment: Goal: General experience of comfort will improve Outcome: Progressing   Problem: Skin Integrity: Goal: Risk for impaired skin integrity will decrease Outcome: Progressing

## 2021-08-21 NOTE — Progress Notes (Signed)
Pt is injury free, afebrile, alert, and oriented X 4. Pt has been showing signs of agitation and paranoia since the beginning of the shift. Staff tried to reorient and reassure the pt frequently during this shift with no relief. The daughter was called and came to be at pt beside for assurance, but pt's agitation remained high. The provider on call was notified and went to the bedside to assess the pt and talk to the family. Pt denies pain and refuses to take any medication.

## 2021-08-21 NOTE — TOC Progression Note (Signed)
Transition of Care [facility]) - Progression Note    Patient Details  Name: Gina Bond MRN: [id number] Date of Birth: 1932/09/29  Transition of Care [facility]) CM/SW Mariano Colon, [place] Phone Number: 08/21/2021, 4:06 PM  Clinical Narrative:   Spoke with patient re: bed offers. She expressed a strong interest in [facility] since she lives close by and has good friends who have been there and had a good experience.  Will speak to [facility] on Monday as she has not yet reviewed the referral. TOC will continue to follow during the course of hospitalization.     Expected Discharge Plan: Skilled Nursing Facility Barriers to Discharge: SNF Pending bed offer  Expected Discharge Plan and Services Expected Discharge Plan: [place]   Discharge Planning Services: CM Consult   Living arrangements for the past 2 months: Apartment                                       Social Determinants of Health (SDOH) Interventions    Readmission Risk Interventions Readmission Risk Prevention Plan 08/20/2021  Transportation Screening Complete  PCP or Specialist Appt within 5-7 Days Complete  Home Care Screening Complete  Medication Review (RN CM) Complete  Some recent data might be hidden

## 2021-08-21 NOTE — Progress Notes (Signed)
     Subjective: 2 Days Post-Op s/p Procedure(s): OPEN REDUCTION INTERNAL FIXATION (ORIF) DISTAL RADIAL FRACTURE   Patient is alert, oriented. Feeling good. Daughter at bedside. Denies nausea and vomiting.      Objective:  PE: VITALS:   Vitals:   08/19/21 2014 08/20/21 0531 08/20/21 1426 08/21/21 0458  BP: 137/65 126/60 (!) 161/71 (!) 194/90  Pulse: 66 (!) 58 74 78  Resp: '18 20  20  '$ Temp: 98.5 F (36.9 C) 98.1 F (36.7 C) 98.5 F (36.9 C) 98.5 F (36.9 C)  TempSrc:  Oral Oral Oral  SpO2: 93% 96% (!) 88% 92%  Weight:      Height:       RUE -in sling.  No edema, or ecchymosis to right shoulder. Full range of motion at right wrist and all fingers of right hand.  Distal sensation intact.  2+ radial pulse.   LUE - Splint CDI. Skin intact though cannot assess fully beneath splint. Nontender to palpation proximally, with full and painless ROM throughout hand with DPC of 0. + Motor in  AIN, PIN, Ulnar distributions. Sensation intact in medial, radial, and ulnar distributions. Well perfused digits.     LLE -knee immobilizer in place.  Able to perform straight leg raise.  Dorsiflexion and plantarflexion intact.  Able to flex and extend all toes.  2+ DP pulse.    LABS  No results found for this or any previous visit (from the past 24 hour(s)).   No results found.  Assessment/Plan: Comminuted impacted right humeral neck fracture - sling at all times, will plan for nonoperatively treatment -  NWB RUE.  - will reserve anterior shoulder mass work up until she is outpatient   Left tibial plateau fracture - keep knee immobilizer in place at all times - NWB LLE  - will order a bledsoe locked in extension to see if she tolerates this brace better   Left distal radius fracture: - s/p L distal radius ORIF 08/20/21 - continue splint, can use sling for comfort - NWB L wrist and hand, ok to bear weight through elbow   Right elbow pain - CT shows no fracture or dislocation   Left  hip pain - no significant hip pain today   Back pain - x-rays negative, no back pain today   PT and OT evals have been ordered, ok to pivot on RLE, ok to weight bear through left elbow.  SCD's and Lovenox for DVT prophylaxis    Contact information:   After hours and holidays please check [email or web address] for group call information for Sports Med [place] 08/21/2021, 12:18 PM

## 2021-08-21 NOTE — Progress Notes (Signed)
Pt is confused, agitated, restless, not redirectable, non-compliant with the plan of care and safety. Pt keeps trying to get out of bed and she is a great risk for falls and injury. Provider call Kennon Holter, NP) was notified.

## 2021-08-21 NOTE — Progress Notes (Signed)
IM haldol given at 1322 today for agitation and impulsiveness. This LPN did not initiate waist restraint. Per Dr. Billie Ruddy was advised to assess the need for soft restraints after IM haldol injection. Patient was compliant after IM injection, impulsiveness decreased, and call bell was used by pt when attempting to get up and out of the bed.

## 2021-08-21 NOTE — Congregational Nurse Program (Signed)
Pt is confused, agitated, restless, not redirectable, non-compliant with the plan of care and safety. Family was contacted for support to help redirect pt.

## 2021-08-21 NOTE — Progress Notes (Deleted)
The patient is injury-free, afebrile, alert, and oriented X 4. Vital signs were within the baseline during this shift. Pt denies pain, SOB, nausea, vomiting, dizziness, signs or symptoms of bleeding or infection or acute changes during this shift. We will continue to monitor and work toward achieving the care plan goals.

## 2021-08-21 NOTE — Progress Notes (Signed)
Called by bedside RN throughout the night. Daughter is also at the bedside with concerns for safety. She keeps attempting to get out of bed and is being uncooperative at this time. On assessment, pt is alert and oriented x4 and appears to understand the reason for this admission. RN has attempted several different ways to redirect and reorient. A sitter was ordered and daughter has remained in the room throughout the night. They have been unsuccessful in getting her to sleep. I have had a discussion with the pt about remaining in the bed and calling for assistance. She promises to do so but continues to refuse any medications to help her relax or sleep. Stating she will sleep when she gets home. I have placed a PRN in the chart in case she changes her mind and wishes to rest. Advised RN to continue to redirect. Discuss with attending this am about daughter and patient's concern.   Lovey Newcomer, NP [facility] [phone] [phone]

## 2021-08-21 NOTE — Progress Notes (Signed)
Patient noted to continuously keep getting out of bed. Patient  Aox4, but noncompliant. Patient educated on fall precautions/ preventative measures. Patient verbalized understanding of educational teaching. Patient was also educated on the extent of her injuries and to call if needing help to get up as she is a two assist. Patient continues to bear weight on left leg and attempts to get up by self instead of utilizing call bell. Patient noted to have attempted to get out of bed 6 times today, two being successful. Patient did not fall. MD and daughter notified of concern for patient safety. Daughter noted to be healthcare power of attorney and also concerned of patient safety. MD ordered IM Haldol and soft waist restraint for patient safety. Patients daughter agreed this is the best option to keep the patient safe.
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
## 2021-08-22 DIAGNOSIS — J449 Chronic obstructive pulmonary disease, unspecified: Secondary | ICD-10-CM | POA: Diagnosis not present

## 2021-08-22 DIAGNOSIS — Y92009 Unspecified place in unspecified non-institutional (private) residence as the place of occurrence of the external cause: Secondary | ICD-10-CM | POA: Diagnosis not present

## 2021-08-22 DIAGNOSIS — I714 Abdominal aortic aneurysm, without rupture: Secondary | ICD-10-CM | POA: Diagnosis not present

## 2021-08-22 DIAGNOSIS — W19XXXA Unspecified fall, initial encounter: Secondary | ICD-10-CM | POA: Diagnosis not present

## 2021-08-22 LAB — BASIC METABOLIC PANEL
Anion gap: 10 (ref 5–15)
BUN: 23 mg/dL (ref 8–23)
CO2: 28 mmol/L (ref 22–32)
Calcium: 9.3 mg/dL (ref 8.9–10.3)
Chloride: 100 mmol/L (ref 98–111)
Creatinine, Ser: 1.47 mg/dL — ABNORMAL HIGH (ref 0.44–1.00)
GFR, Estimated: 35 mL/min — ABNORMAL LOW (ref >60)
Glucose, Bld: 112 mg/dL — ABNORMAL HIGH (ref 70–99)
Potassium: 3.5 mmol/L (ref 3.5–5.1)
Sodium: 138 mmol/L (ref 135–145)

## 2021-08-22 LAB — CBC
HCT: 35.1 % — ABNORMAL LOW (ref 36.0–46.0)
Hemoglobin: 11.7 g/dL — ABNORMAL LOW (ref 12.0–15.0)
MCH: 29.8 pg (ref 26.0–34.0)
MCHC: 33.3 g/dL (ref 30.0–36.0)
MCV: 89.3 fL (ref 80.0–100.0)
Platelets: 217 10*3/uL (ref 150–400)
RBC: 3.93 MIL/uL (ref 3.87–5.11)
RDW: 13.4 % (ref 11.5–15.5)
WBC: 5.2 10*3/uL (ref 4.0–10.5)
nRBC: 0 % (ref 0.0–0.2)

## 2021-08-22 LAB — MAGNESIUM: Magnesium: 1.9 mg/dL (ref 1.7–2.4)

## 2021-08-22 MED ORDER — ENOXAPARIN SODIUM 30 MG/0.3ML IJ SOSY: 30.0000 mg | PREFILLED_SYRINGE | INTRAMUSCULAR | Status: DC

## 2021-08-22 MED ORDER — ENOXAPARIN SODIUM 30 MG/0.3ML IJ SOSY
30.0000 mg | PREFILLED_SYRINGE | INTRAMUSCULAR | Status: DC
  Administered 2021-08-23 – 2021-08-24 (×2): 30 mg via SUBCUTANEOUS
  Filled 2021-08-22 (×2): qty 0.3

## 2021-08-22 MED ORDER — LACTATED RINGERS IV SOLN: INTRAVENOUS | Status: AC

## 2021-08-22 MED ORDER — HYDRALAZINE HCL 50 MG PO TABS
50.0000 mg | ORAL_TABLET | Freq: Three times a day (TID) | ORAL | Status: DC
  Administered 2021-08-22 – 2021-08-24 (×7): 50 mg via ORAL
  Filled 2021-08-22 (×7): qty 1
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
## 2021-08-23 DIAGNOSIS — W19XXXA Unspecified fall, initial encounter: Secondary | ICD-10-CM | POA: Diagnosis not present

## 2021-08-23 DIAGNOSIS — I714 Abdominal aortic aneurysm, without rupture: Secondary | ICD-10-CM | POA: Diagnosis not present

## 2021-08-23 DIAGNOSIS — Y92009 Unspecified place in unspecified non-institutional (private) residence as the place of occurrence of the external cause: Secondary | ICD-10-CM | POA: Diagnosis not present

## 2021-08-23 DIAGNOSIS — J449 Chronic obstructive pulmonary disease, unspecified: Secondary | ICD-10-CM | POA: Diagnosis not present

## 2021-08-23 LAB — BASIC METABOLIC PANEL
Anion gap: 8 (ref 5–15)
BUN: 34 mg/dL — ABNORMAL HIGH (ref 8–23)
CO2: 30 mmol/L (ref 22–32)
Calcium: 9.4 mg/dL (ref 8.9–10.3)
Chloride: 102 mmol/L (ref 98–111)
Creatinine, Ser: 1.59 mg/dL — ABNORMAL HIGH (ref 0.44–1.00)
GFR, Estimated: 31 mL/min — ABNORMAL LOW (ref >60)
Glucose, Bld: 109 mg/dL — ABNORMAL HIGH (ref 70–99)
Potassium: 3.5 mmol/L (ref 3.5–5.1)
Sodium: 140 mmol/L (ref 135–145)

## 2021-08-23 LAB — CBC
HCT: 34.3 % — ABNORMAL LOW (ref 36.0–46.0)
Hemoglobin: 11.4 g/dL — ABNORMAL LOW (ref 12.0–15.0)
MCH: 29.9 pg (ref 26.0–34.0)
MCHC: 33.2 g/dL (ref 30.0–36.0)
MCV: 90 fL (ref 80.0–100.0)
Platelets: 235 10*3/uL (ref 150–400)
RBC: 3.81 MIL/uL — ABNORMAL LOW (ref 3.87–5.11)
RDW: 13.6 % (ref 11.5–15.5)
WBC: 4.6 10*3/uL (ref 4.0–10.5)
nRBC: 0 % (ref 0.0–0.2)

## 2021-08-23 LAB — RESP PANEL BY RT-PCR (FLU A&B, COVID) ARPGX2
Influenza A by PCR: NEGATIVE
Influenza B by PCR: NEGATIVE
SARS Coronavirus 2 by RT PCR: NEGATIVE

## 2021-08-23 LAB — PROTEIN / CREATININE RATIO, URINE
Creatinine, Urine: 114.09 mg/dL
Protein Creatinine Ratio: 0.09 mg/mg{Cre} (ref 0.00–0.15)
Total Protein, Urine: 10 mg/dL

## 2021-08-23 LAB — NA AND K (SODIUM & POTASSIUM), RAND UR
Potassium Urine: 20 mmol/L
Sodium, Ur: <10 mmol/L

## 2021-08-23 LAB — MAGNESIUM: Magnesium: 1.9 mg/dL (ref 1.7–2.4)

## 2021-08-23 MED ORDER — LACTATED RINGERS IV SOLN: INTRAVENOUS | Status: AC
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
## 2021-08-23 NOTE — Progress Notes (Addendum)
Subjective: 4 Days Post-Op s/p Procedure(s): OPEN REDUCTION INTERNAL FIXATION (ORIF) DISTAL RADIAL FRACTURE  Patient alert, sitting up in chair. States she continue to have been especially at shoulder, however, much improved since admission. Denies CP, SOB, nausea, vomiting, abdominal pain. No other complaints.   Objective:  PE: VITALS:   Vitals:   08/22/21 2200 08/23/21 0111 08/23/21 0319 08/23/21 0405  BP: (!) 168/90 110/76 114/64   Pulse: 72 75 77 72  Resp: 16  18   Temp: 98.3 F (36.8 C)  (!) 97.4 F (36.3 C)   TempSrc: Oral  Oral   SpO2: 94%  (!) 88% 92%  Weight:      Height:       RUE -in sling.  No edema, or ecchymosis to right shoulder. Full range of motion at right wrist and all fingers of right hand.  Distal sensation intact.  2+ radial pulse.   LUE -in sling and volar splint. Able to flex and extend all fingers of the left hand. No pain on movement of left elbow. Fingers warm and well-perfused.  Distal sensation intact.   LLE -bledsoe brace in placed.  Able to perform straight leg raise.  Dorsiflexion and plantarflexion intact.  Able to flex and extend all toes.  2+ DP pulse.    LABS  Results for orders placed or performed during the hospital encounter of 08/17/21 (from the past 24 hour(s))  Basic metabolic panel     Status: Abnormal   Collection Time: 08/23/21  4:35 AM  Result Value Ref Range   Sodium 140 135 - 145 mmol/L   Potassium 3.5 3.5 - 5.1 mmol/L   Chloride 102 98 - 111 mmol/L   CO2 30 22 - 32 mmol/L   Glucose, Bld 109 (H) 70 - 99 mg/dL   BUN 34 (H) 8 - 23 mg/dL   Creatinine, Ser 1.59 (H) 0.44 - 1.00 mg/dL   Calcium 9.4 8.9 - 10.3 mg/dL   GFR, Estimated 31 (L) >60 mL/min   Anion gap 8 5 - 15  CBC     Status: Abnormal   Collection Time: 08/23/21  4:35 AM  Result Value Ref Range   WBC 4.6 4.0 - 10.5 K/uL   RBC 3.81 (L) 3.87 - 5.11 MIL/uL   Hemoglobin 11.4 (L) 12.0 - 15.0 g/dL   HCT 34.3 (L) 36.0 - 46.0 %   MCV 90.0 80.0 - 100.0 fL   MCH  29.9 26.0 - 34.0 pg   MCHC 33.2 30.0 - 36.0 g/dL   RDW 13.6 11.5 - 15.5 %   Platelets 235 150 - 400 K/uL   nRBC 0.0 0.0 - 0.2 %  Magnesium     Status: None   Collection Time: 08/23/21  4:35 AM  Result Value Ref Range   Magnesium 1.9 1.7 - 2.4 mg/dL    No results found.  Assessment/Plan: Comminuted impacted right humeral neck fracture - sling at all times, will plan for nonoperatively treatment -  NWB RUE - will reserve anterior shoulder mass work up until she is outpatient   Left tibial plateau fracture - keep knee immobilizer or bledsoe brace in place at all times - NWB LLE   Left distal radius fracture: - s/p L distal radius ORIF 08/20/21 - continue splint, can use sling for comfort - NWB L wrist and hand, ok to weightbear through elbow   Right elbow pain - CT shows no fracture or dislocation  Back pain - x-rays negative, mild back  pain today   Up with therapy, ok to pivot on RLE, ok to weight bear through left elbow.  SCD's and Lovenox for DVT prophylaxis while inpatient, Cr increased to 1.59 this morning, lovenox dosage was decreased to 30 mg q 24 hours. Follow up in 2 weeks with Dr. Mardelle Matte, ok to discharge from ortho standpoint with placed in SNF    Contact information:   Weekdays 8-5 Merlene Pulling, PA-C [phone] A fter hours and holidays please check [email or web address] for group call information for [facility]  Ventura Bruns 08/23/2021, 10:13 AM

## 2021-08-23 NOTE — Progress Notes (Signed)
Patient had a active order for restraint but no restraint were applied on the patient. There was no indication for restraint and this order had been in place previously with no application, therefor no longer an valid order. Covering provider was notified of this. As this order had already expired, and no inappropriate use of restrain was done this shift, the was no further indication of renewal and clarification per provider.

## 2021-08-23 NOTE — Care Management Important Message (Signed)
Important Message  Patient Details IM Letter given to the Patient. Name: Gina Bond MRN: [id number] Date of Birth: [birth year]   Medicare Important Message Given:  Yes     Kerin Salen 08/23/2021, 11:20 AM

## 2021-08-24 DIAGNOSIS — J449 Chronic obstructive pulmonary disease, unspecified: Secondary | ICD-10-CM | POA: Diagnosis not present

## 2021-08-24 DIAGNOSIS — Y92009 Unspecified place in unspecified non-institutional (private) residence as the place of occurrence of the external cause: Secondary | ICD-10-CM | POA: Diagnosis not present

## 2021-08-24 DIAGNOSIS — W19XXXA Unspecified fall, initial encounter: Secondary | ICD-10-CM | POA: Diagnosis not present

## 2021-08-24 DIAGNOSIS — I714 Abdominal aortic aneurysm, without rupture: Secondary | ICD-10-CM | POA: Diagnosis not present

## 2021-08-24 LAB — CBC
HCT: 34.6 % — ABNORMAL LOW (ref 36.0–46.0)
Hemoglobin: 11.4 g/dL — ABNORMAL LOW (ref 12.0–15.0)
MCH: 29.8 pg (ref 26.0–34.0)
MCHC: 32.9 g/dL (ref 30.0–36.0)
MCV: 90.3 fL (ref 80.0–100.0)
Platelets: 233 10*3/uL (ref 150–400)
RBC: 3.83 MIL/uL — ABNORMAL LOW (ref 3.87–5.11)
RDW: 13.9 % (ref 11.5–15.5)
WBC: 4.5 10*3/uL (ref 4.0–10.5)
nRBC: 0 % (ref 0.0–0.2)

## 2021-08-24 LAB — BASIC METABOLIC PANEL
Anion gap: 8 (ref 5–15)
BUN: 28 mg/dL — ABNORMAL HIGH (ref 8–23)
CO2: 28 mmol/L (ref 22–32)
Calcium: 9.3 mg/dL (ref 8.9–10.3)
Chloride: 105 mmol/L (ref 98–111)
Creatinine, Ser: 1.26 mg/dL — ABNORMAL HIGH (ref 0.44–1.00)
GFR, Estimated: 42 mL/min — ABNORMAL LOW (ref >60)
Glucose, Bld: 99 mg/dL (ref 70–99)
Potassium: 3.8 mmol/L (ref 3.5–5.1)
Sodium: 141 mmol/L (ref 135–145)

## 2021-08-24 LAB — MAGNESIUM: Magnesium: 1.9 mg/dL (ref 1.7–2.4)

## 2021-08-24 MED ORDER — QUETIAPINE FUMARATE 100 MG PO TABS: 100.0000 mg | ORAL_TABLET | Freq: Every day | ORAL | Status: DC

## 2021-08-24 MED ORDER — HYDROCODONE-ACETAMINOPHEN 5-325 MG PO TABS: 1.0000 | ORAL_TABLET | Freq: Four times a day (QID) | ORAL | 0 refills | Status: AC | PRN

## 2021-08-24 MED ORDER — ENOXAPARIN SODIUM 30 MG/0.3ML IJ SOSY: 30.0000 mg | PREFILLED_SYRINGE | INTRAMUSCULAR | 0 refills | Status: DC

## 2021-08-24 MED ORDER — ISOSORBIDE MONONITRATE ER 120 MG PO TB24
120.0000 mg | ORAL_TABLET | Freq: Every evening | ORAL | Status: DC
Start: 2021-08-24 — End: 2022-02-04

## 2021-08-24 NOTE — Discharge Instructions (Signed)
Diet: As you were doing prior to hospitalization   Shower:  May shower but keep the wounds dry, use an occlusive plastic wrap, NO SOAKING IN TUB.  If the bandage gets wet, change with a clean dry gauze.  If you have a splint on, leave the splint in place and keep the splint dry with a plastic bag.  Dressing:  You may change your dressing 3-5 days after surgery, unless you have a splint.  If you have a splint, then just leave the splint in place and we will change your bandages during your first follow-up appointment.    If you had hand or foot surgery, we will plan to remove your stitches in about 2 weeks in the office.  For all other surgeries, there are sticky tapes (steri-strips) on your wounds and all the stitches are absorbable.  Leave the steri-strips in place when changing your dressings, they will peel off with time, usually 2-3 weeks.  Activity:  Increase activity slowly as tolerated, but follow the weight bearing instructions below.  The rules on driving is that you can not be taking narcotics while you drive, and you must feel in control of the vehicle.    Weight Bearing:   No bearing with with right upper extremity, left leg, or left hand. Ok to bear weight with right arm and through left elbow.  To prevent constipation: you may use a stool softener such as -  Colace (over the counter) 100 mg by mouth twice a day  Drink plenty of fluids (prune juice may be helpful) and high fiber foods Miralax (over the counter) for constipation as needed.    Itching:  If you experience itching with your medications, try taking only a single pain pill, or even half a pain pill at a time.  You may take up to 10 pain pills per day, and you can also use benadryl over the counter for itching or also to help with sleep.   Precautions:  If you experience chest pain or shortness of breath - call 911 immediately for transfer to the hospital emergency department!!  If you develop a fever greater that 101 F,  purulent drainage from wound, increased redness or drainage from wound, or calf pain -- Call the office at [phone]                                                Follow- Up Appointment:  Please call for an appointment to be seen in 2 weeks [place] - [phone]

## 2021-08-24 NOTE — TOC Transition Note (Signed)
Transition of Care [facility]) - CM/SW Discharge Note   Patient Details  Name: Gina Bond MRN: [id number] Date of Birth: [birth year]  [facility] CM/SW Contact:  Trish Mage, LCSW Phone Number: 08/24/2021, 11:40 AM   Clinical Narrative:   Patient who is stable for d/c will transfer to [facility].  PTAR arranged.  Nursing, please call report to [phone], room 503. TOC sign off.    Final next level of care: Skilled Nursing Facility Barriers to Discharge: Barriers Resolved   Patient Goals and CMS Choice        Discharge Placement                       Discharge Plan and Services   Discharge Planning Services: CM Consult                                 Social Determinants of Health (SDOH) Interventions     Readmission Risk Interventions Readmission Risk Prevention Plan 08/20/2021  Transportation Screening Complete  PCP or Specialist Appt within 5-7 Days Complete  Home Care Screening Complete  Medication Review (RN CM) Complete  Some recent data might be hidden

## 2021-08-24 NOTE — Progress Notes (Signed)
Subjective: 5 Days Post-Op s/p Procedure(s): OPEN REDUCTION INTERNAL FIXATION (ORIF) DISTAL RADIAL FRACTURE  Patient alert, sitting up in bed. Pain is mild this morning, mainly located at right shoulder. Denies CP, SOB, nausea, vomiting, abdominal pain. No other complaints. Voiding, patient states she had a bowel movement yetserday.  Objective:  PE: VITALS:   Vitals:   08/23/21 0405 08/23/21 1303 08/23/21 2203 08/24/21 0455  BP:  (!) 152/125 (!) 129/96 (!) 160/77  Pulse: 72 79 76 64  Resp:   18 20  Temp:  98 F (36.7 C) 98.1 F (36.7 C) 97.7 F (36.5 C)  TempSrc:  Oral Oral Oral  SpO2: 92% 94% 93% 93%  Weight:      Height:       RUE -in sling.  No edema, or ecchymosis to right shoulder. Full range of motion at right wrist and all fingers of right hand.  Distal sensation intact.  2+ radial pulse.   LUE -in sling and volar splint. Able to flex and extend all fingers of the left hand. No pain on movement of left elbow. Significant ecchymosis at left elbow. Fingers warm and well-perfused.  Distal sensation intact.   LLE -bledsoe brace in placed.  Able to perform straight leg raise.  Dorsiflexion and plantarflexion intact.  Able to flex and extend all toes.  2+ DP pulse.    LABS  Results for orders placed or performed during the hospital encounter of 08/17/21 (from the past 24 hour(s))  Resp Panel by RT-PCR (Flu A&B, Covid) Nasopharyngeal Swab     Status: None   Collection Time: 08/23/21  1:12 PM   Specimen: Nasopharyngeal Swab; Nasopharyngeal(NP) swabs in vial transport medium  Result Value Ref Range   SARS Coronavirus 2 by RT PCR NEGATIVE NEGATIVE   Influenza A by PCR NEGATIVE NEGATIVE   Influenza B by PCR NEGATIVE NEGATIVE  Na and K (sodium & potassium), rand urine     Status: None   Collection Time: 08/23/21  2:02 PM  Result Value Ref Range   Sodium, Ur <10 mmol/L   Potassium Urine 20 mmol/L  Protein / creatinine ratio, urine     Status: None   Collection Time:  08/23/21  2:02 PM  Result Value Ref Range   Creatinine, Urine 114.09 mg/dL   Total Protein, Urine 10 mg/dL   Protein Creatinine Ratio 0.09 0.00 - 0.15 mg/mg[Cre]  Basic metabolic panel     Status: Abnormal   Collection Time: 08/24/21  5:26 AM  Result Value Ref Range   Sodium 141 135 - 145 mmol/L   Potassium 3.8 3.5 - 5.1 mmol/L   Chloride 105 98 - 111 mmol/L   CO2 28 22 - 32 mmol/L   Glucose, Bld 99 70 - 99 mg/dL   BUN 28 (H) 8 - 23 mg/dL   Creatinine, Ser 1.26 (H) 0.44 - 1.00 mg/dL   Calcium 9.3 8.9 - 10.3 mg/dL   GFR, Estimated 42 (L) >60 mL/min   Anion gap 8 5 - 15  CBC     Status: Abnormal   Collection Time: 08/24/21  5:26 AM  Result Value Ref Range   WBC 4.5 4.0 - 10.5 K/uL   RBC 3.83 (L) 3.87 - 5.11 MIL/uL   Hemoglobin 11.4 (L) 12.0 - 15.0 g/dL   HCT 34.6 (L) 36.0 - 46.0 %   MCV 90.3 80.0 - 100.0 fL   MCH 29.8 26.0 - 34.0 pg   MCHC 32.9 30.0 - 36.0 g/dL  RDW 13.9 11.5 - 15.5 %   Platelets 233 150 - 400 K/uL   nRBC 0.0 0.0 - 0.2 %  Magnesium     Status: None   Collection Time: 08/24/21  5:26 AM  Result Value Ref Range   Magnesium 1.9 1.7 - 2.4 mg/dL    No results found.  Assessment/Plan: Comminuted impacted right humeral neck fracture - sling at all times, will plan for nonoperatively treatment -  NWB RUE - will reserve anterior shoulder mass work up until she is outpatient   Left tibial plateau fracture - keep knee immobilizer or bledsoe brace in place at all times - NWB LLE   Left distal radius fracture: - s/p L distal radius ORIF 08/20/21 - continue splint, can use sling for comfort - NWB L wrist and hand, ok to weightbear through elbow   Right elbow pain - CT shows no fracture or dislocation  Back pain - x-rays negative, no back pain today   Up with therapy, ok to pivot on RLE, ok to weight bear through left elbow.  SCD's and Lovenox for DVT prophylaxis while inpatient, lovenox dosage was decreased to 30 mg q 24 hours - Cr decreased from 1.59 to  1.26 this morning Follow up in 2 weeks with Dr. Mardelle Matte, ok to discharge from ortho standpoint with placed in SNF   Contact information:   Weekdays 8-5 Merlene Pulling, PA-C [phone] A fter hours and holidays please check [email or web address] for group call information for [facility]  Ventura Bruns 08/24/2021, 7:37 AM

## 2021-08-24 NOTE — Progress Notes (Signed)
Report called and given to annette at [facility] facility.

## 2021-08-24 NOTE — Discharge Summary (Signed)
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 RT-PCR (Flu A&B, Covid) Nasopharyngeal Swab     Status: None   Collection Time: 08/17/21  5:53 PM   Specimen: Nasopharyngeal Swab; Nasopharyngeal(NP) swabs in vial transport medium  Result Value Ref Range Status   SARS Coronavirus  2 by RT PCR NEGATIVE NEGATIVE Final    Comment: (NOTE) SARS-CoV-2 target nucleic acids are NOT DETECTED.  The SARS-CoV-2 RNA is generally detectable in upper respiratory specimens during the acute phase of infection. The lowest concentration of SARS-CoV-2 viral copies this assay can detect is 138 copies/mL. A negative result does not preclude SARS-Cov-2 infection and should not be used as the sole basis for treatment or other patient management decisions. A  negative result may occur with  improper specimen collection/handling, submission of specimen other than nasopharyngeal swab, presence of viral mutation(s) within the areas targeted by this assay, and inadequate number of viral copies(<138 copies/mL). A negative result must be combined with clinical observations, patient history, and epidemiological information. The expected result is Negative.  Fact Sheet for Patients:  EntrepreneurPulse.com.au  Fact Sheet for Healthcare Providers:  IncredibleEmployment.be  This test is no t yet approved or cleared by the [place] FDA and  has been authorized for detection and/or diagnosis of SARS-CoV-2 by FDA under an Emergency Use Authorization (EUA). This EUA will remain  in effect (meaning this test can be used) for the duration of the COVID-19 declaration under Section 564(b)(1) of the Act, 21 U.S.C.section 360bbb-3(b)(1), unless the authorization is terminated  or revoked sooner.       Influenza A by PCR NEGATIVE NEGATIVE Final   Influenza B by PCR NEGATIVE NEGATIVE Final    Comment: (NOTE) The Xpert Xpress SARS-CoV-2/FLU/RSV plus assay is intended as an aid in the diagnosis of influenza from Nasopharyngeal swab specimens and should not be used as a sole basis for treatment. Nasal washings and aspirates are unacceptable for Xpert Xpress SARS-CoV-2/FLU/RSV testing.  Fact Sheet for Patients: EntrepreneurPulse.com.au  Fact Sheet for Healthcare Providers: IncredibleEmployment.be  This test is not yet approved or cleared by the [place] FDA and has been authorized for detection and/or diagnosis of SARS-CoV-2 by FDA under an Emergency Use Authorization (EUA). This EUA will remain in effect (meaning this test can be used) for the duration of the COVID-19 declaration under Section 564(b)(1) of the Act, 21 U.S.C. section 360bbb-3(b)(1), unless the authorization  is terminated or revoked.  Performed at [facility], [place] [address] [place], [place] [address]   Surgical pcr screen     Status: None   Collection Time: 08/18/21 12:25 AM   Specimen: Nasal Mucosa; Nasal Swab  Result Value Ref Range Status   MRSA, PCR NEGATIVE NEGATIVE Final   Staphylococcus aureus NEGATIVE NEGATIVE Final    Comment: (NOTE) The Xpert SA Assay (FDA approved for NASAL specimens in patients 33 years of age and older), is one component of a comprehensive surveillance program. It is not intended to diagnose infection nor to guide or monitor treatment. Performed at [facility], [place] [address] [place], [place] [address]   Resp Panel by RT-PCR (Flu A&B, Covid) Nasopharyngeal Swab     Status: None   Collection Time: 08/23/21  1:12 PM   Specimen: Nasopharyngeal Swab; Nasopharyngeal(NP) swabs in vial transport medium  Result Value Ref Range Status   SARS Coronavirus 2 by RT PCR NEGATIVE NEGATIVE Final    Comment: (NOTE) SARS-CoV-2 target nucleic acids are NOT DETECTED.  The SARS-CoV-2 RNA is generally detectable in upper respiratory specimens during the acute phase of infection. The lowest concentration of SARS-CoV-2 viral copies this assay can detect is 138 copies/mL. A negative result does not preclude SARS-Cov-2 infection and should not be used as the sole basis for treatment or other patient  management decisions. A negative result may occur with  improper specimen collection/handling, submission of specimen other than nasopharyngeal swab, presence of viral mutation(s) within the areas targeted by this assay, and inadequate number of viral copies(<138 copies/mL). A negative result must be combined with clinical observations, patient history, and epidemiological information. The expected result is Negative.  Fact Sheet for Patients:  EntrepreneurPulse.com.au  Fact Sheet for Healthcare Providers:   IncredibleEmployment.be  This test is no t yet approved or cleared by the [place] FDA and  has been authorized for detection and/or diagnosis of SARS-CoV-2 by FDA under an Emergency Use Authorization (EUA). This EUA will remain  in effect (meaning this test can be used) for the duration of the COVID-19 declaration under Section 564(b)(1) of the Act, 21 U.S.C.section 360bbb-3(b)(1), unless the authorization is terminated  or revoked sooner.       Influenza A by PCR NEGATIVE NEGATIVE Final   Influenza B by PCR NEGATIVE NEGATIVE Final    Comment: (NOTE) The Xpert Xpress SARS-CoV-2/FLU/RSV plus assay is intended as an aid in the diagnosis of influenza from Nasopharyngeal swab specimens and should not be used as a sole basis for treatment. Nasal washings and aspirates are unacceptable for Xpert Xpress SARS-CoV-2/FLU/RSV testing.  Fact Sheet for Patients: EntrepreneurPulse.com.au  Fact Sheet for Healthcare Providers: IncredibleEmployment.be  This test is not yet approved or cleared by the [place] FDA and has been authorized for detection and/or diagnosis of SARS-CoV-2 by FDA under an Emergency Use Authorization (EUA). This EUA will remain in effect (meaning this test can be used) for the duration of the COVID-19 declaration under Section 564(b)(1) of the Act, 21 U.S.C. section 360bbb-3(b)(1), unless the authorization is terminated or revoked.  Performed at [facility], [place] [address] [place], [place] [address]      Total time spend on discharging this patient, including the last patient exam, discussing the hospital stay, instructions for ongoing care as it relates to all pertinent caregivers, as well as preparing the medical discharge records, prescriptions, and/or referrals as applicable, is 30 minutes.    Enzo Bi, MD  [facility] 08/24/2021, 8:59 AM

## 2021-08-27 ENCOUNTER — Ambulatory Visit: Payer: Medicare Other | Admitting: Podiatry

## 2021-08-30 ENCOUNTER — Encounter (HOSPITAL_COMMUNITY): Payer: Self-pay | Admitting: Orthopedic Surgery

## 2021-09-09 ENCOUNTER — Ambulatory Visit: Payer: Medicare Other | Admitting: Psychiatry

## 2021-09-16 ENCOUNTER — Encounter (HOSPITAL_COMMUNITY): Payer: Self-pay | Admitting: Radiology

## 2021-09-24 ENCOUNTER — Other Ambulatory Visit: Payer: Self-pay | Admitting: Family Medicine

## 2021-09-24 DIAGNOSIS — D499 Neoplasm of unspecified behavior of unspecified site: Secondary | ICD-10-CM

## 2021-10-12 ENCOUNTER — Other Ambulatory Visit: Payer: Self-pay | Admitting: Family Medicine

## 2021-10-12 DIAGNOSIS — R2231 Localized swelling, mass and lump, right upper limb: Secondary | ICD-10-CM
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
## 2021-10-18 ENCOUNTER — Ambulatory Visit: Payer: Medicare Other | Admitting: Psychiatry

## 2021-10-20 ENCOUNTER — Ambulatory Visit: Payer: Medicare Other | Admitting: Cardiology

## 2021-10-31 ENCOUNTER — Other Ambulatory Visit: Payer: Self-pay

## 2021-10-31 ENCOUNTER — Ambulatory Visit
Admission: RE | Admit: 2021-10-31 | Discharge: 2021-10-31 | Disposition: A | Discharge status: Home / Self Care | Payer: Medicare Other | Source: Ambulatory Visit | Attending: Family Medicine | Admitting: Family Medicine

## 2021-10-31 DIAGNOSIS — R2231 Localized swelling, mass and lump, right upper limb: Secondary | ICD-10-CM

## 2021-11-09 ENCOUNTER — Ambulatory Visit (INDEPENDENT_AMBULATORY_CARE_PROVIDER_SITE_OTHER): Payer: Medicare Other | Admitting: Podiatry

## 2021-11-09 ENCOUNTER — Other Ambulatory Visit: Payer: Self-pay

## 2021-11-09 ENCOUNTER — Encounter: Payer: Self-pay | Admitting: Podiatry

## 2021-11-09 DIAGNOSIS — B351 Tinea unguium: Secondary | ICD-10-CM

## 2021-11-09 DIAGNOSIS — M79676 Pain in unspecified toe(s): Secondary | ICD-10-CM | POA: Diagnosis not present

## 2021-11-09 NOTE — Progress Notes (Signed)
Subjective: Haley Fuerstenberg is a pleasant 87 y.o. female patient seen today painful thick toenails that are difficult to trim. Pain interferes with ambulation. Aggravating factors include wearing enclosed shoe gear. Pain is relieved with periodic professional debridement.   PCP is [name], MD. Last visit was: 04/30/2021.  Allergies  Allergen Reactions   Benicar [Olmesartan] Shortness Of Breath and Other (See Comments)    Must be same manufacturer or shortness of breath (NO LONGER TAKES THIS)   Fenofibrate Shortness Of Breath and Other (See Comments)    Must be the same manufacturer or shortness of breath AMNEAL IS THE PREFERRED BRAND    Other Shortness Of Breath, Rash and Other (See Comments)    Wool = Rashes Pt states that steroid inhalers and a lot of other inhalers make her more SOB.  Pt states that changes in medications have caused her to have sob and chest pressure (change in manufacturer of medication) Certain dyes; Fillers used by some manufacturers   Sulfa Antibiotics Hives   Sulfacetamide Sodium Hives   Atorvastatin Other (See Comments)    Cramps in legs    Isosorbide Dinitrate Other (See Comments)    Vertigo    Magnesium Other (See Comments)    Leg cramps   Ondansetron Other (See Comments)    Made the patient appear disoriented   Rosuvastatin Calcium Other (See Comments)    Cramps in legs   Spironolactone Other (See Comments)    BREATHING DIFFICULTIES    Statins Other (See Comments)    Cramps in legs   Sulfamethoxazole Rash   Tobramycin Rash and Other (See Comments)    Blurred vision, also    Objective: Physical Exam  General: Jameia Makris is a pleasant 87 y.o. Caucasian female, WD, WN in NAD. AAO x 3.   Vascular:  Capillary refill time to digits immediate b/l. Palpable DP pulse(s) b/l lower extremities Palpable PT pulse(s) b/l lower extremities Pedal hair present. Lower extremity skin temperature gradient within normal limits. No  pain with calf compression b/l. No edema noted b/l lower extremities.  Dermatological:  Toenails 1-5 b/l elongated, discolored, dystrophic, thickened, crumbly with subungual debris and tenderness to dorsal palpation.  Musculoskeletal:  Normal muscle strength 5/5 to all lower extremity muscle groups bilaterally. No pain crepitus or joint limitation noted with ROM b/l. No gross bony deformities bilaterally.  Neurological:  Protective sensation intact 5/5 intact bilaterally with 10g monofilament b/l. Vibratory sensation intact b/l.  Assessment and Plan:  1. Pain due to onychomycosis of toenail      -Examined patient. -Patient to continue soft, supportive shoe gear daily. -Toenails 1-5 b/l were debrided in length and girth with sterile nail nippers and dremel without iatrogenic bleeding.  -Patient to report any pedal injuries to medical professional immediately. -Patient/POA to call should there be question/concern in the interim.  No follow-ups on file.  Lorenda Peck, MD

## 2021-11-10 ENCOUNTER — Ambulatory Visit: Payer: Medicare Other | Admitting: Cardiology

## 2021-11-10 ENCOUNTER — Encounter: Payer: Self-pay | Admitting: Cardiology

## 2021-11-10 VITALS — BP 139/72 | HR 72 | Temp 97.8°F | Ht 68.0 in | Wt 141.0 lb

## 2021-11-10 DIAGNOSIS — I7143 Infrarenal abdominal aortic aneurysm, without rupture: Secondary | ICD-10-CM

## 2021-11-10 DIAGNOSIS — I2781 Cor pulmonale (chronic): Secondary | ICD-10-CM

## 2021-11-10 DIAGNOSIS — I10 Essential (primary) hypertension: Secondary | ICD-10-CM

## 2021-11-10 DIAGNOSIS — N1832 Chronic kidney disease, stage 3b: Secondary | ICD-10-CM
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
## 2021-11-19 ENCOUNTER — Emergency Department (HOSPITAL_BASED_OUTPATIENT_CLINIC_OR_DEPARTMENT_OTHER)
Admission: EM | Admit: 2021-11-19 | Discharge: 2021-11-19 | Disposition: A | Discharge status: Home / Self Care | Payer: Medicare Other | Attending: Emergency Medicine | Admitting: Emergency Medicine

## 2021-11-19 ENCOUNTER — Encounter (HOSPITAL_BASED_OUTPATIENT_CLINIC_OR_DEPARTMENT_OTHER): Payer: Self-pay

## 2021-11-19 ENCOUNTER — Other Ambulatory Visit: Payer: Self-pay

## 2021-11-19 ENCOUNTER — Emergency Department (HOSPITAL_BASED_OUTPATIENT_CLINIC_OR_DEPARTMENT_OTHER): Payer: Medicare Other

## 2021-11-19 DIAGNOSIS — N1832 Chronic kidney disease, stage 3b: Secondary | ICD-10-CM | POA: Insufficient documentation

## 2021-11-19 DIAGNOSIS — Z87891 Personal history of nicotine dependence: Secondary | ICD-10-CM | POA: Insufficient documentation

## 2021-11-19 DIAGNOSIS — J449 Chronic obstructive pulmonary disease, unspecified: Secondary | ICD-10-CM | POA: Insufficient documentation

## 2021-11-19 DIAGNOSIS — M7989 Other specified soft tissue disorders: Secondary | ICD-10-CM | POA: Diagnosis not present

## 2021-11-19 DIAGNOSIS — Z79899 Other long term (current) drug therapy: Secondary | ICD-10-CM | POA: Insufficient documentation

## 2021-11-19 DIAGNOSIS — Z85828 Personal history of other malignant neoplasm of skin: Secondary | ICD-10-CM | POA: Diagnosis not present

## 2021-11-19 DIAGNOSIS — I2510 Atherosclerotic heart disease of native coronary artery without angina pectoris: Secondary | ICD-10-CM | POA: Insufficient documentation

## 2021-11-19 DIAGNOSIS — I129 Hypertensive chronic kidney disease with stage 1 through stage 4 chronic kidney disease, or unspecified chronic kidney disease: Secondary | ICD-10-CM | POA: Insufficient documentation

## 2021-11-19 MED ORDER — CEPHALEXIN 500 MG PO CAPS: 500.0000 mg | ORAL_CAPSULE | Freq: Two times a day (BID) | ORAL | 0 refills | Status: AC

## 2021-11-19 NOTE — Discharge Instructions (Signed)
Follow-up with your orthopedic doctor.  Suspect swelling likely secondary to Baker's cyst and recent trauma.  Take antibiotic as prescribed.
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
## 2021-11-19 NOTE — ED Triage Notes (Signed)
Per pt and daughter pt pain/swelling/redness to left LE x today-NAD-steady gait

## 2021-11-19 NOTE — ED Notes (Signed)
D/c paperwork reviewed with pt, including prescription. No questions or concerns at time of d/c, ambulatory to ED exit with daughter.

## 2021-12-14 IMAGING — CR DG SHOULDER 2+V*R*
2 series · 2 of 2 positions shown · non-contrast
Comparison: 06/13/2014

CLINICAL DATA: Fell, right shoulder pain

EXAM:
RIGHT SHOULDER - 2+ VIEW

[x shoulder ap right (1 of 2)]
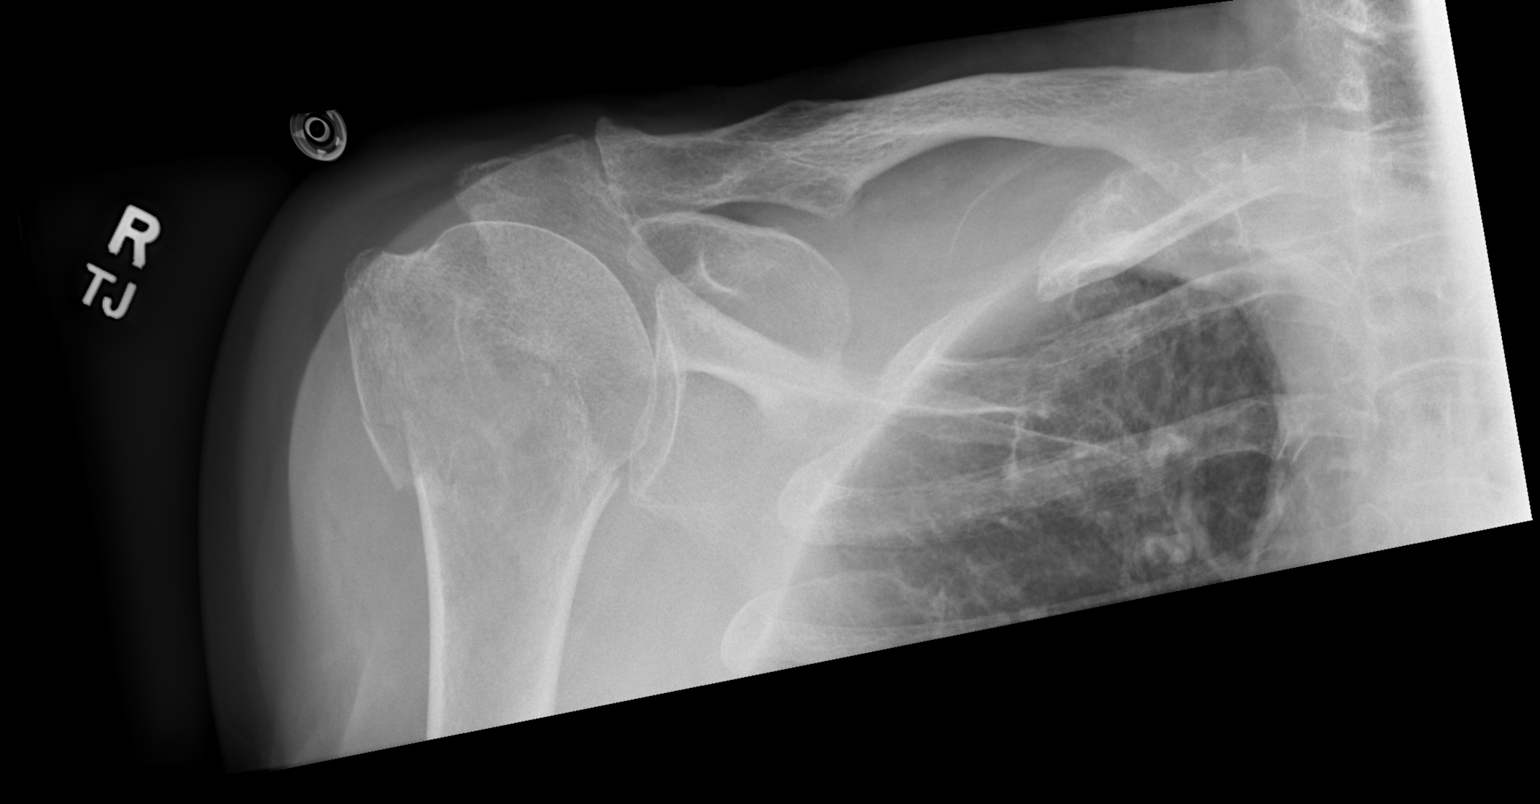

[x shoulder ap right (2 of 2)]
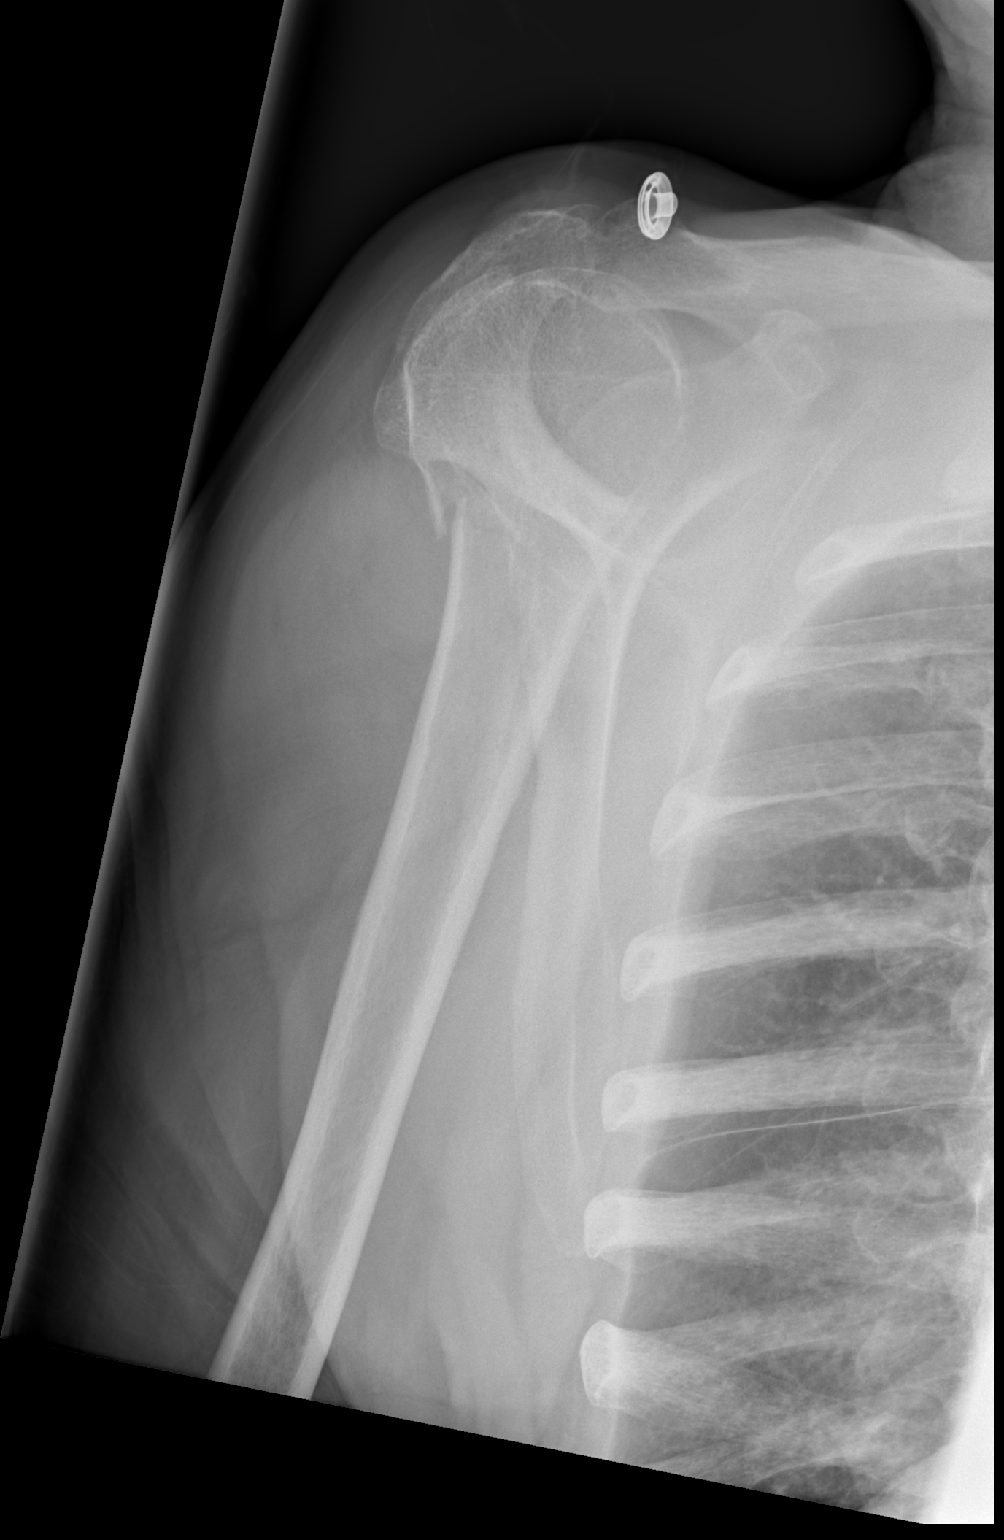

[2 of 2 positions shown; findings below may reference images not displayed]

FINDINGS: Frontal and transscapular views of the right shoulder demonstrate a
comminuted impacted right humeral neck fracture. No dislocation.
Stable acromioclavicular and glenohumeral joint osteoarthritis. The
right chest is clear.
IMPRESSION: 1. Comminuted impacted right humeral neck fracture.

## 2021-12-14 IMAGING — CR DG PELVIS 1-2V
1 series · 1 of 1 positions shown · non-contrast
Comparison: 06/13/2014

CLINICAL DATA: Fell, pain

EXAM:
PELVIS - 1-2 VIEW

[x pelvis]
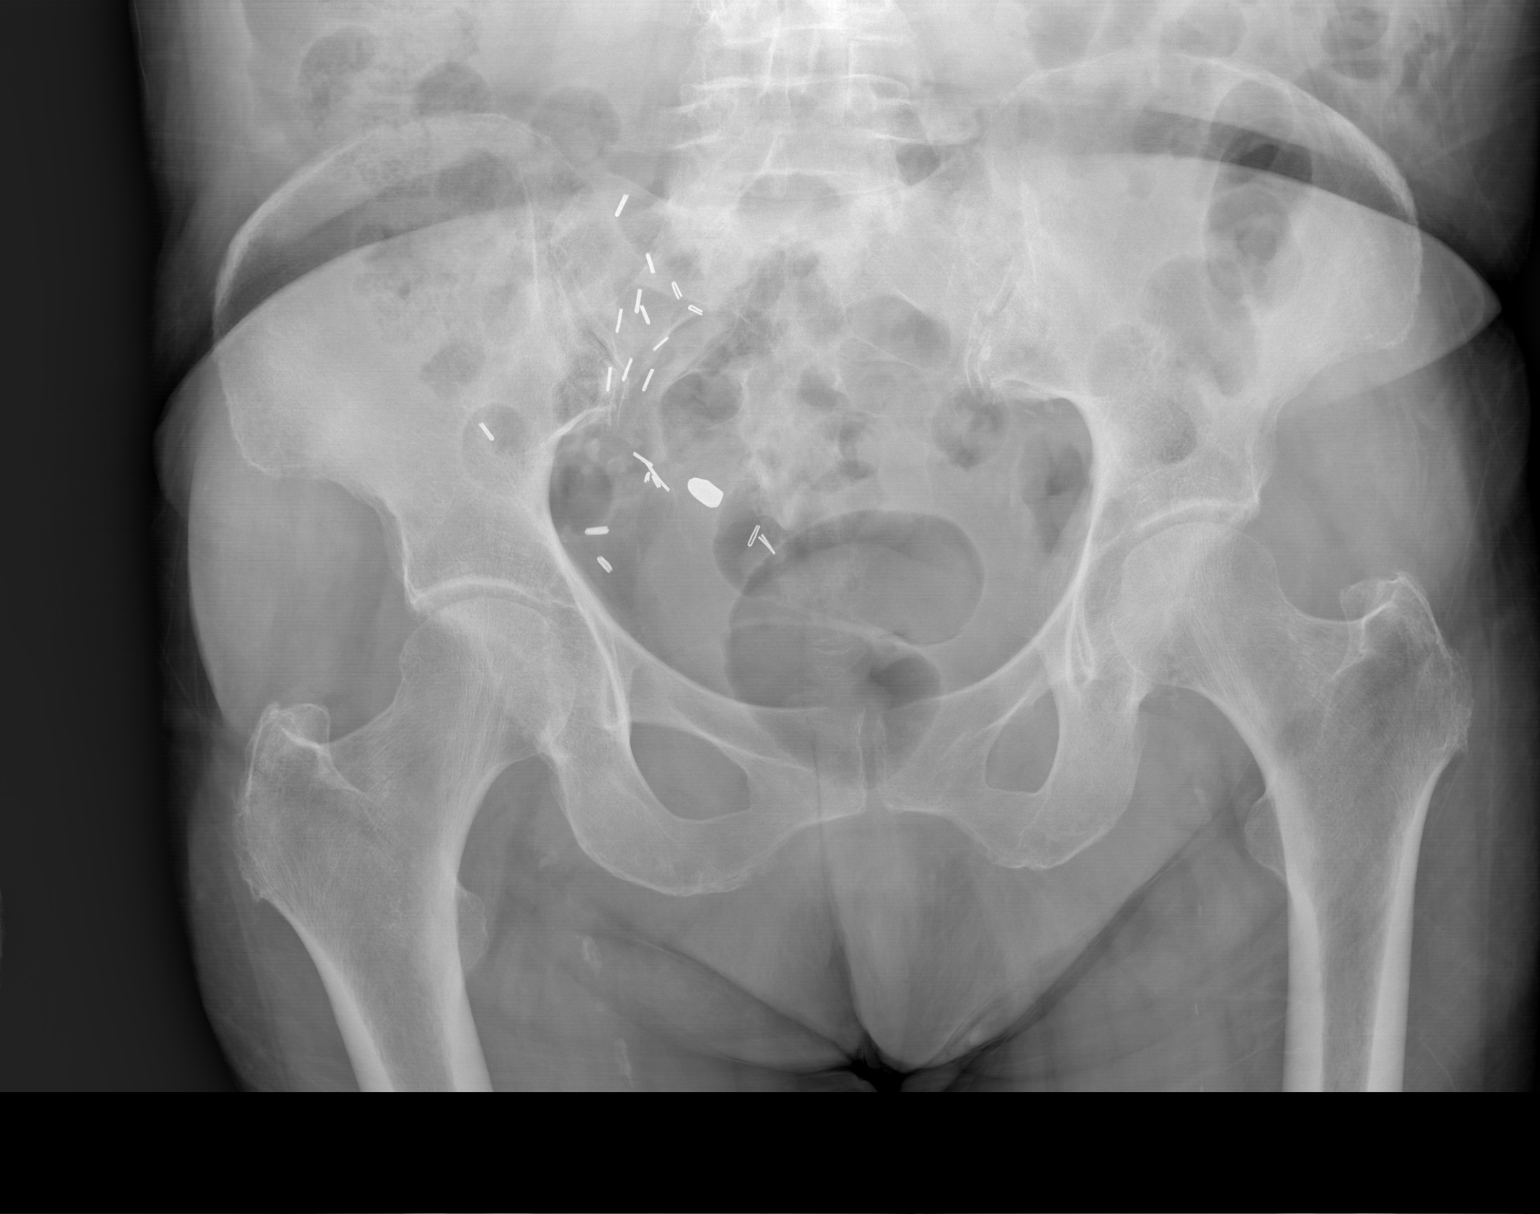

[1 of 1 positions shown; findings below may reference images not displayed]

FINDINGS: Single frontal view of the pelvis demonstrates stable surgical clips
within the right hemipelvis. No acute displaced fracture,
subluxation, or dislocation. Joint spaces are well preserved.
Sacroiliac joints are normal.
IMPRESSION: 1. No acute pelvic fracture.

## 2021-12-14 IMAGING — CR DG RIBS W/ CHEST 3+V*R*
4 series · 4 of 4 positions shown · non-contrast
Comparison: [DATE] [DATE]

CLINICAL DATA: Fell, pain

EXAM:
RIGHT RIBS AND CHEST - 3+ VIEW

[x chest ap (1 of 4)]
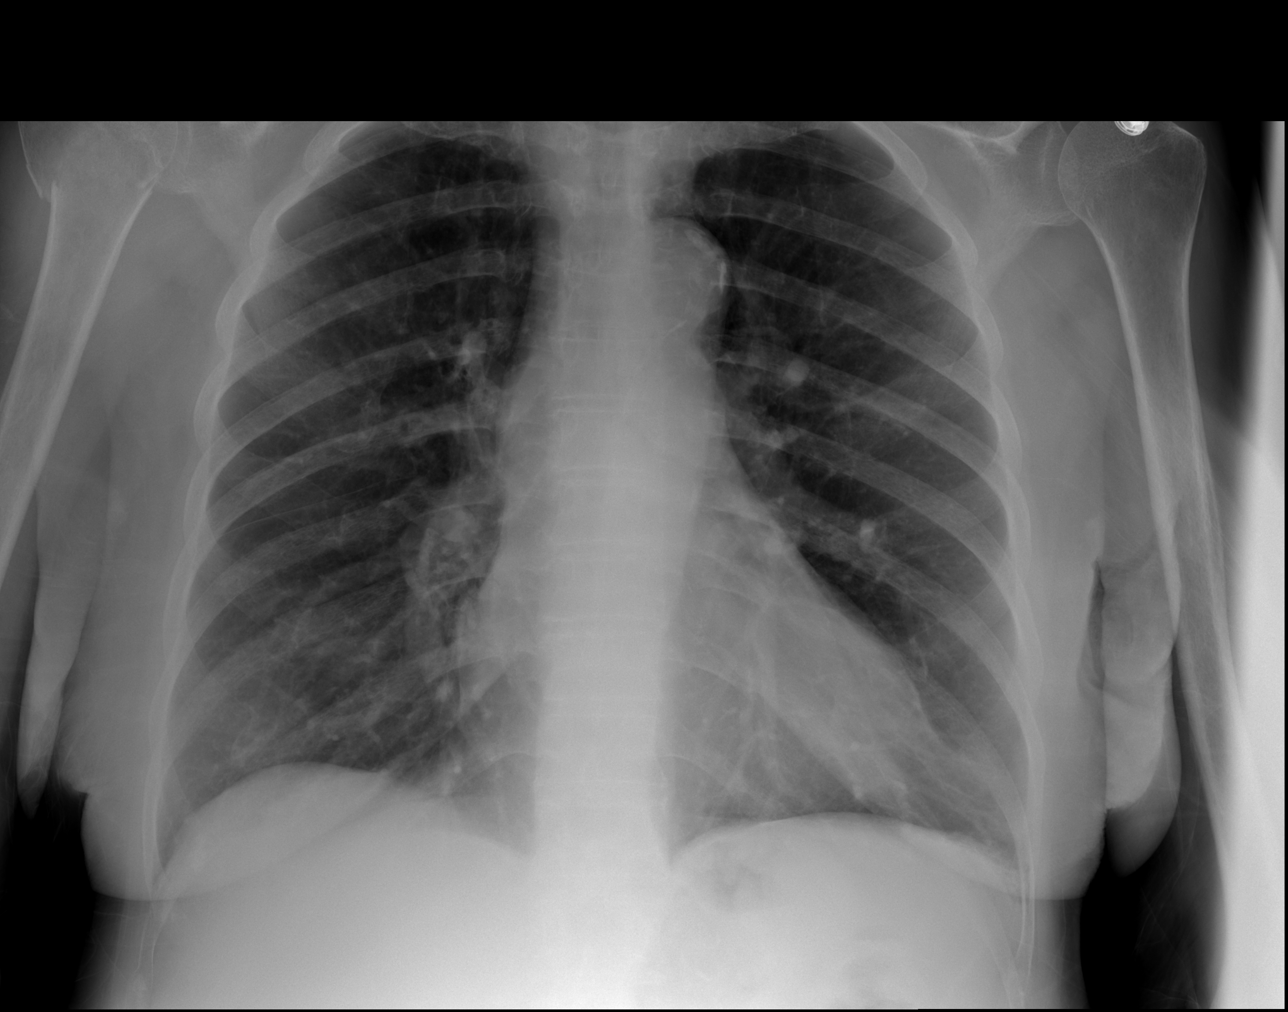

[x chest ap (2 of 4)]
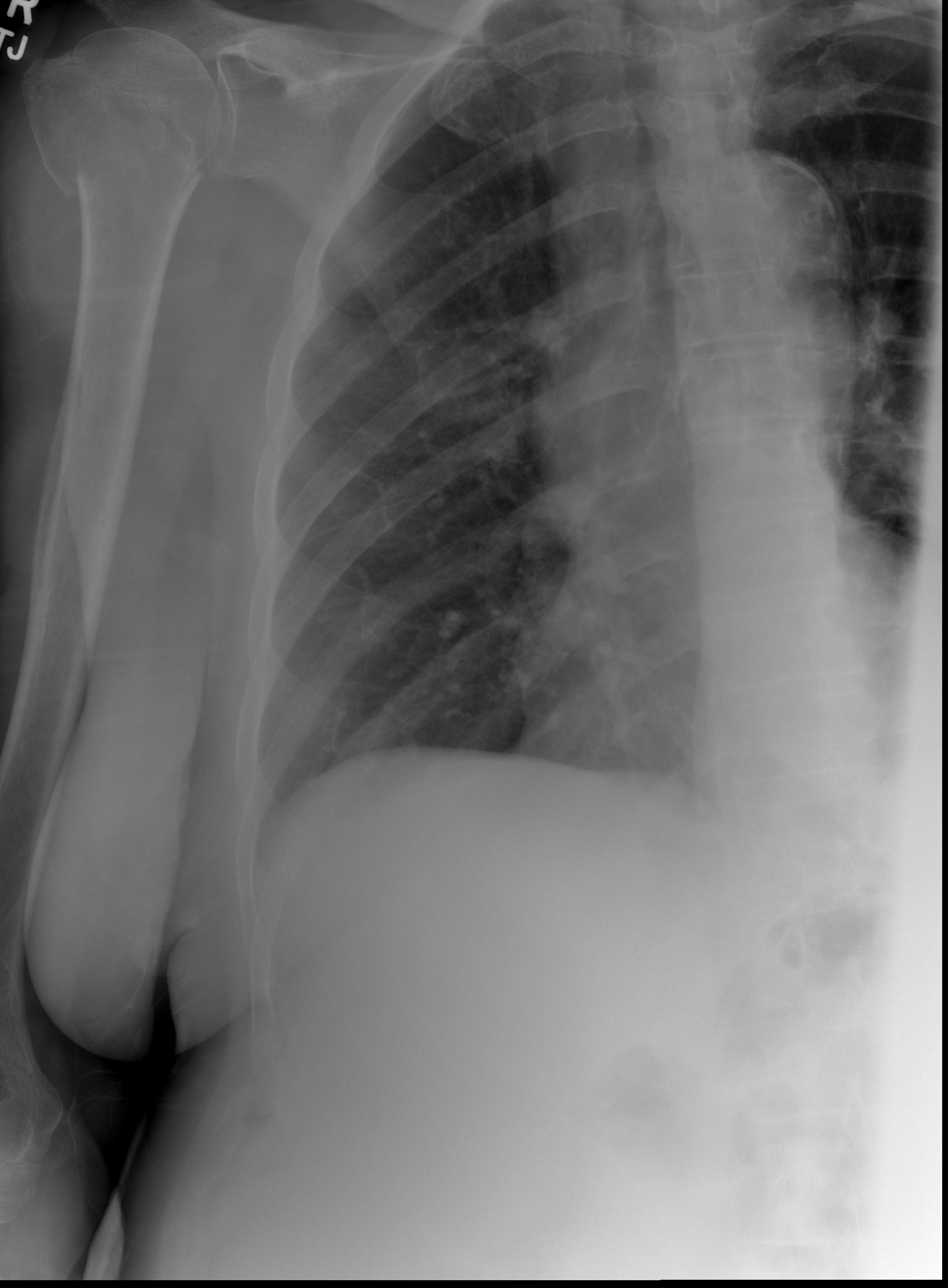

[x chest ap (3 of 4)]
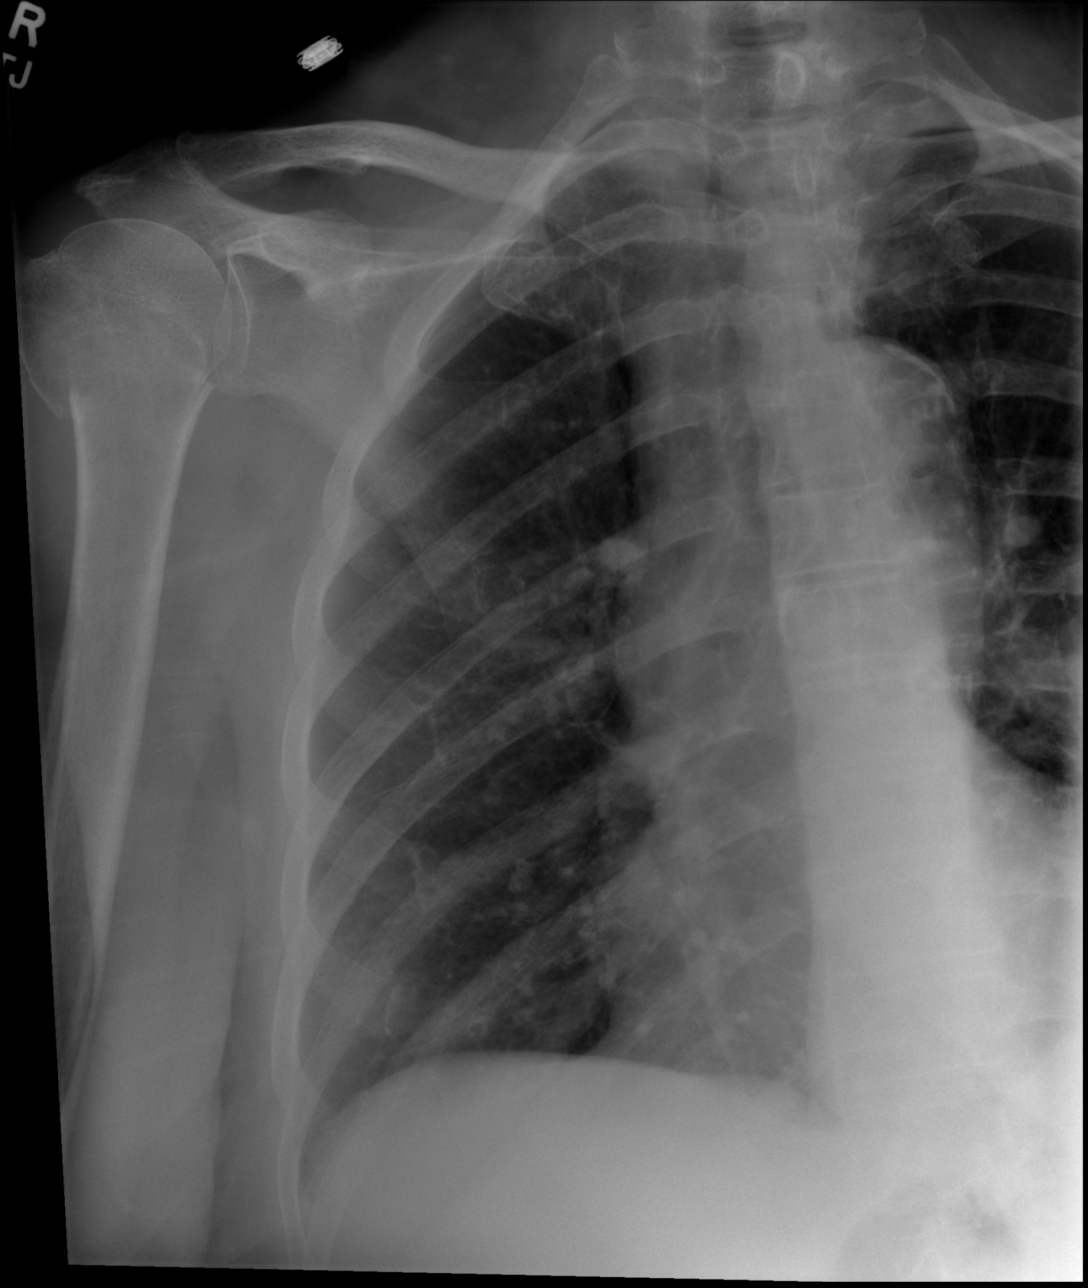

[x chest ap (4 of 4)]
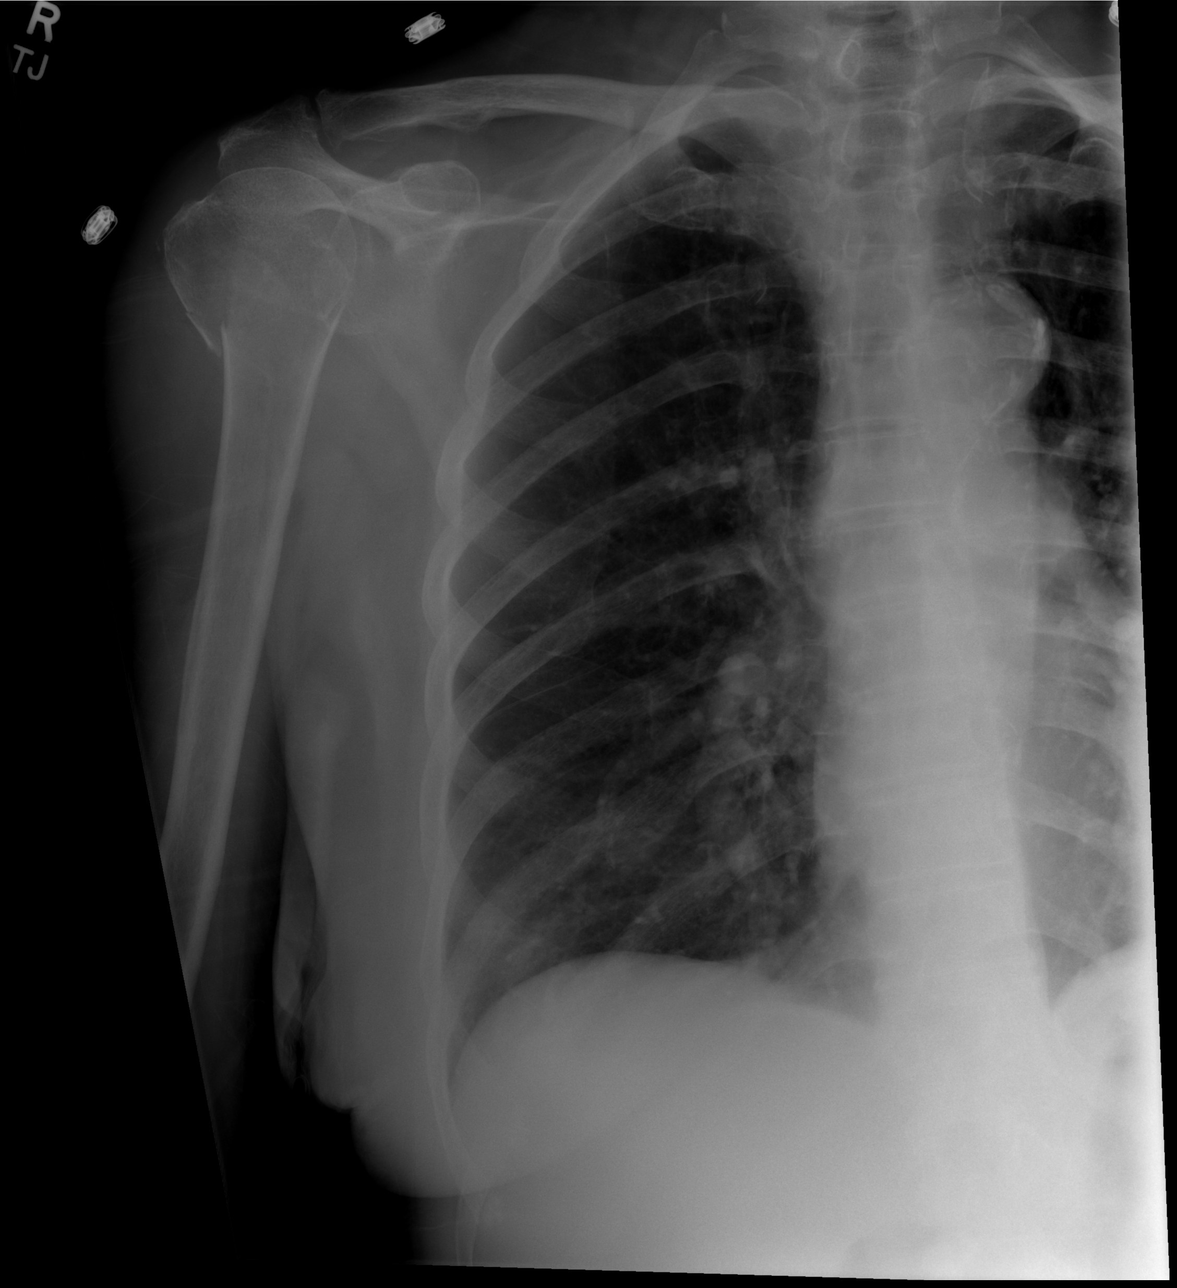

[4 of 4 positions shown; findings below may reference images not displayed]

FINDINGS: Frontal view of the chest as well as frontal and oblique views of
the right thoracic cage are obtained. Cardiac silhouette is
unremarkable. No airspace disease, effusion, or pneumothorax.
Comminuted impacted right humeral neck fracture is noted. No other
acute bony abnormalities.
IMPRESSION: 1. No acute intrathoracic process.
2. Comminuted impacted right humeral neck fracture.

## 2022-02-01 ENCOUNTER — Encounter (HOSPITAL_BASED_OUTPATIENT_CLINIC_OR_DEPARTMENT_OTHER): Payer: Self-pay

## 2022-02-01 ENCOUNTER — Other Ambulatory Visit: Payer: Self-pay

## 2022-02-01 ENCOUNTER — Observation Stay (HOSPITAL_BASED_OUTPATIENT_CLINIC_OR_DEPARTMENT_OTHER)
Admission: EM | Admit: 2022-02-01 | Discharge: 2022-02-02 | Disposition: A | Discharge status: Home Health | Payer: Medicare Other | Attending: Internal Medicine | Admitting: Internal Medicine

## 2022-02-01 ENCOUNTER — Emergency Department (HOSPITAL_BASED_OUTPATIENT_CLINIC_OR_DEPARTMENT_OTHER): Payer: Medicare Other

## 2022-02-01 DIAGNOSIS — J449 Chronic obstructive pulmonary disease, unspecified: Secondary | ICD-10-CM | POA: Diagnosis not present

## 2022-02-01 DIAGNOSIS — I10 Essential (primary) hypertension: Secondary | ICD-10-CM | POA: Diagnosis present

## 2022-02-01 DIAGNOSIS — N1831 Chronic kidney disease, stage 3a: Secondary | ICD-10-CM | POA: Insufficient documentation

## 2022-02-01 DIAGNOSIS — Z87891 Personal history of nicotine dependence: Secondary | ICD-10-CM | POA: Diagnosis not present

## 2022-02-01 DIAGNOSIS — E782 Mixed hyperlipidemia: Secondary | ICD-10-CM

## 2022-02-01 DIAGNOSIS — R1013 Epigastric pain: Principal | ICD-10-CM | POA: Insufficient documentation

## 2022-02-01 DIAGNOSIS — I714 Abdominal aortic aneurysm, without rupture, unspecified: Secondary | ICD-10-CM

## 2022-02-01 DIAGNOSIS — F32A Depression, unspecified: Secondary | ICD-10-CM

## 2022-02-01 DIAGNOSIS — R079 Chest pain, unspecified: Secondary | ICD-10-CM

## 2022-02-01 DIAGNOSIS — K219 Gastro-esophageal reflux disease without esophagitis: Secondary | ICD-10-CM

## 2022-02-01 DIAGNOSIS — Z20822 Contact with and (suspected) exposure to covid-19: Secondary | ICD-10-CM | POA: Insufficient documentation

## 2022-02-01 DIAGNOSIS — Z79899 Other long term (current) drug therapy: Secondary | ICD-10-CM | POA: Insufficient documentation

## 2022-02-01 DIAGNOSIS — Z85828 Personal history of other malignant neoplasm of skin: Secondary | ICD-10-CM | POA: Diagnosis not present

## 2022-02-01 DIAGNOSIS — I2510 Atherosclerotic heart disease of native coronary artery without angina pectoris: Secondary | ICD-10-CM | POA: Diagnosis not present

## 2022-02-01 DIAGNOSIS — R109 Unspecified abdominal pain: Secondary | ICD-10-CM | POA: Diagnosis present

## 2022-02-01 DIAGNOSIS — I129 Hypertensive chronic kidney disease with stage 1 through stage 4 chronic kidney disease, or unspecified chronic kidney disease: Secondary | ICD-10-CM | POA: Insufficient documentation

## 2022-02-01 DIAGNOSIS — I7143 Infrarenal abdominal aortic aneurysm, without rupture: Secondary | ICD-10-CM | POA: Diagnosis not present

## 2022-02-01 LAB — CBC
HCT: 45.8 % (ref 36.0–46.0)
Hemoglobin: 15 g/dL (ref 12.0–15.0)
MCH: 29.6 pg (ref 26.0–34.0)
MCHC: 32.8 g/dL (ref 30.0–36.0)
MCV: 90.3 fL (ref 80.0–100.0)
Platelets: 245 10*3/uL (ref 150–400)
RBC: 5.07 MIL/uL (ref 3.87–5.11)
RDW: 13.3 % (ref 11.5–15.5)
WBC: 7.2 10*3/uL (ref 4.0–10.5)
nRBC: 0 % (ref 0.0–0.2)

## 2022-02-01 LAB — COMPREHENSIVE METABOLIC PANEL
ALT: 18 U/L (ref 0–44)
AST: 29 U/L (ref 15–41)
Albumin: 4.5 g/dL (ref 3.5–5.0)
Alkaline Phosphatase: 41 U/L (ref 38–126)
Anion gap: 10 (ref 5–15)
BUN: 20 mg/dL (ref 8–23)
CO2: 27 mmol/L (ref 22–32)
Calcium: 9.6 mg/dL (ref 8.9–10.3)
Chloride: 100 mmol/L (ref 98–111)
Creatinine, Ser: 1.15 mg/dL — ABNORMAL HIGH (ref 0.44–1.00)
GFR, Estimated: 46 mL/min — ABNORMAL LOW (ref >60)
Glucose, Bld: 94 mg/dL (ref 70–99)
Potassium: 3.5 mmol/L (ref 3.5–5.1)
Sodium: 137 mmol/L (ref 135–145)
Total Bilirubin: 0.8 mg/dL (ref 0.3–1.2)
Total Protein: 7.6 g/dL (ref 6.5–8.1)

## 2022-02-01 LAB — URINALYSIS, ROUTINE W REFLEX MICROSCOPIC
Bilirubin Urine: NEGATIVE
Glucose, UA: NEGATIVE mg/dL
Hgb urine dipstick: NEGATIVE
Ketones, ur: NEGATIVE mg/dL
Leukocytes,Ua: NEGATIVE
Nitrite: NEGATIVE
Protein, ur: NEGATIVE mg/dL
Specific Gravity, Urine: 1.015 (ref 1.005–1.030)
pH: 5.5 (ref 5.0–8.0)

## 2022-02-01 LAB — LIPASE, BLOOD: Lipase: 63 U/L — ABNORMAL HIGH (ref 11–51)

## 2022-02-01 LAB — RESP PANEL BY RT-PCR (FLU A&B, COVID) ARPGX2
Influenza A by PCR: NEGATIVE
Influenza B by PCR: NEGATIVE
SARS Coronavirus 2 by RT PCR: NEGATIVE

## 2022-02-01 LAB — TROPONIN I (HIGH SENSITIVITY)
Troponin I (High Sensitivity): 20 ng/L — ABNORMAL HIGH (ref <18)
Troponin I (High Sensitivity): 37 ng/L — ABNORMAL HIGH (ref <18)

## 2022-02-01 MED ORDER — NITROGLYCERIN 0.4 MG SL SUBL: 0.4000 mg | SUBLINGUAL_TABLET | SUBLINGUAL | Status: DC | PRN

## 2022-02-01 MED ORDER — LIDOCAINE VISCOUS HCL 2 % MT SOLN
15.0000 mL | Freq: Once | OROMUCOSAL | Status: AC
  Administered 2022-02-01: 15 mL via ORAL
  Filled 2022-02-01: qty 15

## 2022-02-01 MED ORDER — TRAZODONE HCL 50 MG PO TABS
25.0000 mg | ORAL_TABLET | Freq: Every evening | ORAL | Status: DC | PRN
  Filled 2022-02-01: qty 1

## 2022-02-01 MED ORDER — LABETALOL HCL 5 MG/ML IV SOLN
5.0000 mg | Freq: Once | INTRAVENOUS | Status: AC
  Administered 2022-02-01: 5 mg via INTRAVENOUS
  Filled 2022-02-01: qty 4

## 2022-02-01 MED ORDER — PRAVASTATIN SODIUM 40 MG PO TABS
40.0000 mg | ORAL_TABLET | Freq: Every day | ORAL | Status: DC
  Administered 2022-02-02: 40 mg via ORAL
  Filled 2022-02-01: qty 1

## 2022-02-01 MED ORDER — ENOXAPARIN SODIUM 40 MG/0.4ML IJ SOSY
40.0000 mg | PREFILLED_SYRINGE | INTRAMUSCULAR | Status: DC
  Administered 2022-02-02: 40 mg via SUBCUTANEOUS
  Filled 2022-02-01: qty 0.4

## 2022-02-01 MED ORDER — SODIUM CHLORIDE 0.9 % IV SOLN: INTRAVENOUS | Status: DC

## 2022-02-01 MED ORDER — LACTULOSE 10 GM/15ML PO SOLN: 20.0000 g | Freq: Every day | ORAL | Status: DC | PRN

## 2022-02-01 MED ORDER — VERAPAMIL HCL ER 240 MG PO TBCR
240.0000 mg | EXTENDED_RELEASE_TABLET | Freq: Two times a day (BID) | ORAL | Status: DC
  Administered 2022-02-01 – 2022-02-02 (×2): 240 mg via ORAL
  Filled 2022-02-01 (×3): qty 1

## 2022-02-01 MED ORDER — SODIUM CHLORIDE 0.9 % IV BOLUS
1000.0000 mL | Freq: Once | INTRAVENOUS | Status: AC
  Administered 2022-02-01: 1000 mL via INTRAVENOUS

## 2022-02-01 MED ORDER — VITAMIN D 25 MCG (1000 UNIT) PO TABS
1000.0000 [IU] | ORAL_TABLET | Freq: Every day | ORAL | Status: DC
  Administered 2022-02-02: 1000 [IU] via ORAL
  Filled 2022-02-01: qty 1

## 2022-02-01 MED ORDER — MORPHINE SULFATE (PF) 2 MG/ML IV SOLN: 2.0000 mg | INTRAVENOUS | Status: DC | PRN

## 2022-02-01 MED ORDER — ALPRAZOLAM 0.25 MG PO TABS: 0.2500 mg | ORAL_TABLET | Freq: Two times a day (BID) | ORAL | Status: DC | PRN

## 2022-02-01 MED ORDER — LABETALOL HCL 5 MG/ML IV SOLN
10.0000 mg | Freq: Once | INTRAVENOUS | Status: AC
  Administered 2022-02-01: 10 mg via INTRAVENOUS
  Filled 2022-02-01: qty 4

## 2022-02-01 MED ORDER — ALUM & MAG HYDROXIDE-SIMETH 200-200-20 MG/5ML PO SUSP: 30.0000 mL | Freq: Once | ORAL | Status: DC

## 2022-02-01 MED ORDER — POLYVINYL ALCOHOL 1.4 % OP SOLN
1.0000 [drp] | Freq: Two times a day (BID) | OPHTHALMIC | Status: DC
Start: 2022-02-02 — End: 2022-02-02
  Filled 2022-02-01: qty 15

## 2022-02-01 MED ORDER — ISOSORBIDE MONONITRATE ER 60 MG PO TB24
120.0000 mg | ORAL_TABLET | Freq: Every evening | ORAL | Status: DC
  Administered 2022-02-02: 120 mg via ORAL
  Filled 2022-02-01: qty 2

## 2022-02-01 MED ORDER — SENNA 8.6 MG PO TABS
1.0000 | ORAL_TABLET | Freq: Every morning | ORAL | Status: DC
  Administered 2022-02-02: 8.6 mg via ORAL
  Filled 2022-02-01: qty 1

## 2022-02-01 MED ORDER — HYDRALAZINE HCL 50 MG PO TABS: 50.0000 mg | ORAL_TABLET | ORAL | Status: DC

## 2022-02-01 MED ORDER — POLYETHYL GLYC-PROPYL GLYC PF 0.4-0.3 % OP SOLN: 1.0000 [drp] | Freq: Two times a day (BID) | OPHTHALMIC | Status: DC

## 2022-02-01 MED ORDER — QUETIAPINE FUMARATE 50 MG PO TABS
100.0000 mg | ORAL_TABLET | Freq: Every day | ORAL | Status: DC
  Administered 2022-02-01: 100 mg via ORAL
  Filled 2022-02-01: qty 2

## 2022-02-01 MED ORDER — PROCHLORPERAZINE EDISYLATE 10 MG/2ML IJ SOLN
5.0000 mg | Freq: Once | INTRAMUSCULAR | Status: AC
  Administered 2022-02-01: 5 mg via INTRAVENOUS
  Filled 2022-02-01: qty 2

## 2022-02-01 MED ORDER — FUROSEMIDE 20 MG PO TABS
20.0000 mg | ORAL_TABLET | ORAL | Status: DC
  Administered 2022-02-02: 20 mg via ORAL
  Filled 2022-02-01: qty 1

## 2022-02-01 MED ORDER — PANTOPRAZOLE SODIUM 40 MG PO TBEC
40.0000 mg | DELAYED_RELEASE_TABLET | Freq: Every day | ORAL | Status: DC
  Administered 2022-02-02: 40 mg via ORAL
  Filled 2022-02-01: qty 1

## 2022-02-01 MED ORDER — ONDANSETRON HCL 4 MG/2ML IJ SOLN
4.0000 mg | Freq: Four times a day (QID) | INTRAMUSCULAR | Status: DC | PRN
Start: 2022-02-01 — End: 2022-02-02

## 2022-02-01 MED ORDER — POLYETHYLENE GLYCOL 3350 17 GM/SCOOP PO POWD: 17.0000 g | Freq: Every day | ORAL | Status: DC

## 2022-02-01 MED ORDER — ASPIRIN 81 MG PO CHEW
324.0000 mg | CHEWABLE_TABLET | Freq: Once | ORAL | Status: AC
  Administered 2022-02-01: 324 mg via ORAL
  Filled 2022-02-01: qty 4

## 2022-02-01 MED ORDER — IOHEXOL 350 MG/ML SOLN
100.0000 mL | Freq: Once | INTRAVENOUS | Status: AC | PRN
  Administered 2022-02-01: 100 mL via INTRAVENOUS

## 2022-02-01 MED ORDER — FENOFIBRATE 160 MG PO TABS
160.0000 mg | ORAL_TABLET | Freq: Every day | ORAL | Status: DC
  Administered 2022-02-02: 160 mg via ORAL
  Filled 2022-02-01: qty 1

## 2022-02-01 MED ORDER — ADULT MULTIVITAMIN W/MINERALS CH
1.0000 | ORAL_TABLET | Freq: Every day | ORAL | Status: DC
  Administered 2022-02-02: 1 via ORAL
  Filled 2022-02-01: qty 1

## 2022-02-01 MED ORDER — ASPIRIN EC 81 MG PO TBEC
81.0000 mg | DELAYED_RELEASE_TABLET | Freq: Every day | ORAL | Status: DC
  Administered 2022-02-02: 81 mg via ORAL
  Filled 2022-02-01: qty 1

## 2022-02-01 MED ORDER — ACETAMINOPHEN 325 MG PO TABS: 650.0000 mg | ORAL_TABLET | ORAL | Status: DC | PRN
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
## 2022-02-01 NOTE — ED Provider Notes (Signed)
°  Physical Exam  BP (!) 175/73    Pulse 70    Temp 97.7 F (36.5 C) (Oral)    Resp 18    Ht 5\' 7"  (1.702 m)    Wt 64.4 kg    SpO2 100%    BMI 22.24 kg/m   Physical Exam Abdominal:     Tenderness: There is abdominal tenderness in the right upper quadrant and epigastric area.    Procedures  Procedures  ED Course / MDM     Received care of patient from Dr. Ronnald Nian.  Please see his note for prior history, physical and care.  Briefly this is an 88 year old female with a history of suprarenal AAA, hypertension, who presents with concern for epigastric abdominal pain/lower chest pain, nausea and vomiting.  DDx includes SBO, symptomatic AAA, ruptured AAA, aortic dissection, gastritis, ACS, cholangitis, choledocolithiasis, pyelonephritis.  CTA dissection study completed with stable/minimal increase in suprarenal abdominal aortic aneurysm 4.9 to 5.1.  Troponin 20, increased to 37.   Unclear if symptoms due to a gastritis, symptomatic AAA, ACS at this time.  Given increasing troponins feel admission is indicated. Also noted to have hypertension, CKD.  Discussed with Dr. Terri Skains ([facility]-she sees Dr. Neita Carp continued monitoring, blood pressure control, hospitalist admit, ECHO.  Consulted vascular surgery Dr. Doren Custard.       Gareth Morgan, MD 02/02/22 [phone]

## 2022-02-01 NOTE — ED Triage Notes (Signed)
Per pt and daughter pt with epigastric pain that radiates to back, n/v ~830am-NAD-steady gait

## 2022-02-01 NOTE — ED Provider Notes (Signed)
[facility] EMERGENCY DEPARTMENT Provider Note   CSN: [id number] Arrival date & time: 02/01/22  1301     History  Chief Complaint  Patient presents with   Abdominal Pain    Gina Bond is a 88 y.o. female.  The history is provided by the patient.  Abdominal Pain Pain location:  Epigastric Pain quality: aching   Pain radiates to:  Does not radiate Pain severity:  Moderate Onset quality:  Gradual Duration:  5 hours Progression:  Improving Chronicity:  New Context: previous surgery   Relieved by:  Nothing Worsened by:  Nothing Associated symptoms: nausea and vomiting   Associated symptoms: no anorexia, no belching, no chest pain, no chills, no constipation, no cough, no diarrhea, no dysuria, no fatigue, no fever, no shortness of breath and no sore throat   Risk factors: multiple surgeries   Risk factors comment:  AAA hx, reflux, CAD     Home Medications Prior to Admission medications   Medication Sig Start Date End Date Taking? Authorizing Provider  acetaminophen (TYLENOL) 500 MG tablet Take 500 mg by mouth every 6 (six) hours as needed for mild pain or headache.    [provider]  albuterol (VENTOLIN HFA) 108 (90 Base) MCG/ACT inhaler Inhale 2 puffs into the lungs every 6 (six) hours as needed for wheezing or shortness of breath. Patient not taking: Reported on 11/10/2021    [provider]  cholecalciferol (VITAMIN D3) 25 MCG (1000 UNIT) tablet Take 1,000 Units by mouth daily.    [provider]  fenofibrate micronized (LOFIBRA) 134 MG capsule Take 134 mg by mouth daily with supper. 05/06/21   [provider]  furosemide (LASIX) 20 MG tablet Take 1 tablet (20 mg total) by mouth every other day. 04/23/21   Adrian Prows, MD  hydrALAZINE (APRESOLINE) 50 MG tablet Take 50-100 mg by mouth See admin instructions. Take 100 mg by mouth in the morning, 50 mg at 2 PM, and 50 mg at bedtime    [provider]  isosorbide  mononitrate (IMDUR) 120 MG 24 hr tablet Take 1 tablet (120 mg total) by mouth every evening. 08/24/21   Enzo Bi, MD  Multiple Vitamins-Minerals (CENTRUM SILVER 50+WOMEN) TABS Take 1 tablet by mouth daily with breakfast.    [provider]  nitroGLYCERIN (NITROSTAT) 0.4 MG SL tablet Place 1 tablet (0.4 mg total) under the tongue every 5 (five) minutes as needed for chest pain. 01/19/21   Aline August, MD  omeprazole (PRILOSEC) 40 MG capsule Take 40 mg by mouth daily before breakfast. 01/02/15   [provider]  polyethylene glycol powder (GLYCOLAX/MIRALAX) 17 GM/SCOOP powder Take 17 g by mouth at bedtime.    [provider]  pravastatin (PRAVACHOL) 20 MG tablet TAKE 2 TABLETS EVERY       EVENING AFTER DINNER. Patient taking differently: Take 40 mg by mouth daily after supper. 04/12/21   Adrian Prows, MD  QUEtiapine (SEROQUEL) 100 MG tablet Take 1 tablet (100 mg total) by mouth at bedtime for 14 days. For sleep and delirium. 08/24/21 09/07/21  Enzo Bi, MD  senna (SENOKOT) 8.6 MG tablet Take 1 tablet by mouth in the morning.    [provider]  SYSTANE ULTRA PF 0.4-0.3 % SOLN Place 1 drop into both eyes 2 (two) times daily.    [provider]  verapamil (CALAN-SR) 240 MG CR tablet Take 240 mg by mouth 2 (two) times daily. 12/23/20   [provider]  Allergies    Benicar [olmesartan], Fenofibrate, Other, Sulfa antibiotics, Sulfacetamide sodium, Atorvastatin, Isosorbide dinitrate, Magnesium, Ondansetron, Rosuvastatin calcium, Spironolactone, Statins, Sulfamethoxazole, and Tobramycin    Review of Systems   Review of Systems  Constitutional:  Negative for chills, fatigue and fever.  HENT:  Negative for sore throat.   Respiratory:  Negative for cough and shortness of breath.   Cardiovascular:  Negative for chest pain.  Gastrointestinal:  Positive for abdominal pain, nausea and vomiting. Negative for anorexia, constipation and diarrhea.   Genitourinary:  Negative for dysuria.   Physical Exam Updated Vital Signs BP (!) 143/87 (BP Location: Right Arm)    Pulse 75    Temp 97.7 F (36.5 C) (Oral)    Resp 18    Ht 5\' 7"  (1.702 m)    Wt 64.4 kg    SpO2 96%    BMI 22.24 kg/m  Physical Exam Vitals and nursing note reviewed.  Constitutional:      General: She is not in acute distress.    Appearance: She is well-developed.  HENT:     Head: Normocephalic and atraumatic.     Mouth/Throat:     Mouth: Mucous membranes are moist.  Eyes:     Extraocular Movements: Extraocular movements intact.     Conjunctiva/sclera: Conjunctivae normal.     Pupils: Pupils are equal, round, and reactive to light.  Cardiovascular:     Rate and Rhythm: Normal rate and regular rhythm.     Heart sounds: Normal heart sounds. No murmur heard. Pulmonary:     Effort: Pulmonary effort is normal. No respiratory distress.     Breath sounds: Normal breath sounds.  Abdominal:     Palpations: Abdomen is soft.     Tenderness: There is abdominal tenderness in the epigastric area. There is no guarding or rebound.     Hernia: No hernia is present.  Musculoskeletal:        General: No swelling.     Cervical back: Neck supple.  Skin:    General: Skin is warm and dry.     Capillary Refill: Capillary refill takes less than 2 seconds.  Neurological:     General: No focal deficit present.     Mental Status: She is alert.  Psychiatric:        Mood and Affect: Mood normal.    ED Results / Procedures / Treatments   Labs (all labs ordered are listed, but only abnormal results are displayed) Labs Reviewed  LIPASE, BLOOD - Abnormal; Notable for the following components:      Result Value   Lipase 63 (*)    All other components within normal limits  COMPREHENSIVE METABOLIC PANEL - Abnormal; Notable for the following components:   Creatinine, Ser 1.15 (*)    GFR, Estimated 46 (*)    All other components within normal limits  TROPONIN I (HIGH SENSITIVITY) -  Abnormal; Notable for the following components:   Troponin I (High Sensitivity) 20 (*)    All other components within normal limits  CBC  URINALYSIS, ROUTINE W REFLEX MICROSCOPIC    EKG EKG Interpretation  Date/Time:  Tuesday February 01 2022 13:17:55 EST Ventricular Rate:  74 PR Interval:  190 QRS Duration: 103 QT Interval:  425 QTC Calculation: 472 R Axis:   -71 Text Interpretation: Confirmed by Lennice Sites ([id number]) on 02/01/2022 1:25:50 PM  Radiology No results found.  Procedures Procedures    Medications Ordered in ED Medications  sodium chloride 0.9 % bolus 1,000 mL (1,000  mLs Intravenous New Bag/Given 02/01/22 1341)  prochlorperazine (COMPAZINE) injection 5 mg (5 mg Intravenous Given 02/01/22 1343)    ED Course/ Medical Decision Making/ A&P                           Medical Decision Making Amount and/or Complexity of Data Reviewed Labs: ordered. Radiology: ordered.  Risk Prescription drug management.   Jena Tegeler is here with abdominal pain, nausea, vomiting.  Hx of small bowel obstruction, CAD, AAA.  Discomfort started this morning.  Little bit different than her reflux type symptoms.  Symptoms have slightly improved.  She is tender mostly in the epigastric region.  Pain is in the center of her chest goes into her back.  Differential diagnosis includes AAA, dissection, pneumonia, ACS, less likely PE, pancreatitis, cholecystitis.  Could be reflux.  We will get CT dissection study of the chest, abdomen, pelvis.  CBC, CMP, lipase, troponin.  EKG per my review and interpretation shows sinus rhythm.  No ischemic changes.  I have reviewed and interpreted labs.  Lipase is mildly elevated but not high enough to suggest pancreatitis.  Creatinine is at baseline.  Troponin 20.  Urinalysis unremarkable for infection.  No significant leukocytosis or anemia otherwise.  We will trend troponin.  We will get dissection study.  Signed out to oncoming ED staff.  Overall  atypical story for ACS.  Please see oncoming ED staff provider note for further results, evaluation, disposition of the patient.        Final Clinical Impression(s) / ED Diagnoses Final diagnoses:  Epigastric pain    Rx / DC Orders ED Discharge Orders     None         Lennice Sites, DO 02/01/22 1450

## 2022-02-02 ENCOUNTER — Encounter (HOSPITAL_COMMUNITY): Payer: Self-pay | Admitting: Internal Medicine

## 2022-02-02 ENCOUNTER — Observation Stay (HOSPITAL_COMMUNITY): Payer: Medicare Other

## 2022-02-02 DIAGNOSIS — Z20822 Contact with and (suspected) exposure to covid-19: Secondary | ICD-10-CM | POA: Diagnosis not present

## 2022-02-02 DIAGNOSIS — K219 Gastro-esophageal reflux disease without esophagitis: Secondary | ICD-10-CM

## 2022-02-02 DIAGNOSIS — R079 Chest pain, unspecified: Secondary | ICD-10-CM | POA: Diagnosis not present

## 2022-02-02 DIAGNOSIS — F32A Depression, unspecified: Secondary | ICD-10-CM

## 2022-02-02 DIAGNOSIS — Z85828 Personal history of other malignant neoplasm of skin: Secondary | ICD-10-CM | POA: Diagnosis not present

## 2022-02-02 DIAGNOSIS — R1013 Epigastric pain: Secondary | ICD-10-CM | POA: Diagnosis not present

## 2022-02-02 DIAGNOSIS — I2510 Atherosclerotic heart disease of native coronary artery without angina pectoris: Secondary | ICD-10-CM | POA: Diagnosis not present

## 2022-02-02 DIAGNOSIS — I7143 Infrarenal abdominal aortic aneurysm, without rupture: Secondary | ICD-10-CM | POA: Diagnosis not present

## 2022-02-02 LAB — ECHOCARDIOGRAM COMPLETE
AR max vel: 2.03 cm2
AV Area VTI: 2.07 cm2
AV Area mean vel: 1.97 cm2
AV Mean grad: 11 mmHg
AV Peak grad: 21.2 mmHg
AV Vena cont: 0.2 cm
Ao pk vel: 2.3 m/s
Area-P 1/2: 2.2 cm2
Calc EF: 62.7 %
Height: 67 in
P 1/2 time: 472 msec
Radius: 0.2 cm
S' Lateral: 2.2 cm
Single Plane A2C EF: 62.5 %
Single Plane A4C EF: 63.9 %
Weight: 1989.43 oz

## 2022-02-02 LAB — LIPID PANEL
Cholesterol: 176 mg/dL (ref 0–200)
HDL: 42 mg/dL (ref >40)
LDL Cholesterol: 107 mg/dL — ABNORMAL HIGH (ref 0–99)
Total CHOL/HDL Ratio: 4.2 RATIO
Triglycerides: 133 mg/dL (ref <150)
VLDL: 27 mg/dL (ref 0–40)

## 2022-02-02 LAB — TROPONIN I (HIGH SENSITIVITY)
Troponin I (High Sensitivity): 65 ng/L — ABNORMAL HIGH (ref <18)
Troponin I (High Sensitivity): 41 ng/L — ABNORMAL HIGH (ref <18)

## 2022-02-02 MED ORDER — HYDRALAZINE HCL 50 MG PO TABS
100.0000 mg | ORAL_TABLET | Freq: Every day | ORAL | Status: DC
  Administered 2022-02-02: 100 mg via ORAL
  Filled 2022-02-02: qty 2

## 2022-02-02 MED ORDER — POLYETHYLENE GLYCOL 3350 17 G PO PACK: 17.0000 g | PACK | Freq: Every day | ORAL | Status: DC

## 2022-02-02 MED ORDER — ASPIRIN 81 MG PO TBEC: 81.0000 mg | DELAYED_RELEASE_TABLET | Freq: Every day | ORAL | 11 refills | Status: DC

## 2022-02-02 MED ORDER — HYDRALAZINE HCL 50 MG PO TABS
50.0000 mg | ORAL_TABLET | ORAL | Status: DC
  Administered 2022-02-02 (×2): 50 mg via ORAL
  Filled 2022-02-02 (×2): qty 1

## 2022-02-02 NOTE — Assessment & Plan Note (Signed)
-   We will continue PPI therapy 

## 2022-02-02 NOTE — Progress Notes (Signed)
? ?  VASCULAR SURGERY ASSESSMENT & PLAN:  ? ?5.1 CENTIMETER SUPRARENAL AORTIC ANEURYSM: Her abdominal pain has resolved.  There has been no significant change of her suprarenal aneurysm.  This was 4.8 cm 2 years ago.  As per my previous note, given her age and comorbidities I do not think she is a candidate for an open thoracoabdominal approach for repair of this aneurysm.  Likewise a fenestrated graft would have to be custom made and this would have to be done in [facility].  This is not something that can be done acutely.  Currently she does not wish to be transferred to [facility].  This had previously been discussed with her by Dr. Carlis Abbott. ? ?Vascular surgery will continue to follow.  If she is discharged we can follow her as an outpatient. ? ? ?SUBJECTIVE:  ? ?No complaints this morning.  Abdominal pain has resolved. ? ?PHYSICAL EXAM:  ? ?Vitals:  ? 02/02/22 0423 02/02/22 9371 02/02/22 6967 02/02/22 0553  ?BP: (!) 135/58 (!) 136/58 (!) 112/54 (!) 110/54  ?Pulse: 60 62 (!) 55 (!) 55  ?Resp:  18    ?Temp:  98.4 ?F (36.9 ?C)    ?TempSrc:  Oral    ?SpO2: 96% 95% 93% 93%  ?Weight:      ?Height:      ? ?No significant abdominal pain. ? ?LABS:  ? ?Lab Results  ?Component Value Date  ? WBC 7.2 02/01/2022  ? HGB 15.0 02/01/2022  ? HCT 45.8 02/01/2022  ? MCV 90.3 02/01/2022  ? PLT 245 02/01/2022  ? ?Lab Results  ?Component Value Date  ? CREATININE 1.15 (H) 02/01/2022  ? ?Lab Results  ?Component Value Date  ? INR 1.0 08/17/2021  ? ? ?PROBLEM LIST:   ? ?Principal Problem: ?  Chest pain, rule out acute myocardial infarction ?Active Problems: ?  Essential hypertension ?  Abdominal aortic aneurysm ?  Mixed hyperlipidemia ?  GERD (gastroesophageal reflux disease) ?  Depression ? ? ?CURRENT MEDS:  ? ? alum & mag hydroxide-simeth  30 mL Oral Once  ? aspirin EC  81 mg Oral Daily  ? cholecalciferol  1,000 Units Oral Daily  ? enoxaparin (LOVENOX) injection  40 mg Subcutaneous Q24H  ? fenofibrate  160 mg Oral Daily  ? furosemide  20  mg Oral QODAY  ? hydrALAZINE  100 mg Oral Daily  ? And  ? hydrALAZINE  50 mg Oral 2 times per day  ? isosorbide mononitrate  120 mg Oral QPM  ? multivitamin with minerals  1 tablet Oral Q breakfast  ? pantoprazole  40 mg Oral Daily  ? polyethylene glycol powder  17 g Oral QHS  ? polyvinyl alcohol  1 drop Both Eyes BID  ? pravastatin  40 mg Oral QPC supper  ? QUEtiapine  100 mg Oral QHS  ? senna  1 tablet Oral q AM  ? verapamil  240 mg Oral BID  ? ? ?Deitra Mayo ?Office: [phone] ?02/02/2022 ? ?

## 2022-02-02 NOTE — Progress Notes (Signed)
Was called by the patient's nurse with regards to her experiencing precordial/epigastric discomfort. ? ?Symptoms not suggestive of angina pectoris. ? ?Echocardiogram performed earlier this morning personally reviewed.  LVEF is preserved, grade 2 diastolic impairment, no regional wall motion abnormalities, no significant valvular heart disease. ? ?EKG shows normal sinus rhythm without underlying ischemia or injury pattern. ? ?Troponin pending. ? ?Medications reconciled. ? ?Blood pressure is improving. ? ?Rex Kras, DO, FACC ? ?Pager: [phone] ?Office: [phone] ? ?

## 2022-02-02 NOTE — Assessment & Plan Note (Signed)
-   We will continue Seroquel. ?

## 2022-02-02 NOTE — Assessment & Plan Note (Signed)
-   Vascular surgery consult will be obtained given slight enlargement as mentioned above. ?

## 2022-02-02 NOTE — TOC Initial Note (Signed)
Transition of Care (TOC) - Initial/Assessment Note  ? ? ?Patient Details  ?Name: Gina Bond ?MRN: [id number] ?Date of Birth: 1932/02/20 ? ?Transition of Care (TOC) CM/SW Contact:    ?Graves-Bigelow, Ocie Cornfield, RN ?Phone Number: ?02/02/2022, 4:30 PM ? ?Clinical Narrative:  Case Manager spoke with patient regarding disposition needs. Case Manager asked patient if she has permission to call her daughter and Probation officer received verbal permission. Case Manager called the daughter to discuss plan of care. Daughter states that the patient has had multiple falls and has previously been in SNF's and is currently active with [facility]. Case Manager asked MD for PT/OT consult. PT saw the patient and recommended [facility] PT with supervision. Case Manager discussed the recommendations with the daughter and she states she can work remote with the patient for the first two [place] asked daughter to reinforce the need for the use of the rolling walker when patient ambulates. Case Manager called [facility] and they will follow the patient in the home. Start of care to begin within 24-48 hours post transition home. Case Manager will provide the patient with personal care agencies information. Staff RN to call the daughter when the final test results so the patient can discharge home. No further needs from Case Manager at this time.  ? ? ?Expected Discharge Plan: [place] ?Barriers to Discharge: Continued Medical Work up ? ? ?Patient Goals and CMS Choice ?Patient states their goals for this hospitalization and ongoing recovery are:: to return home with home health services. ?  ?Choice offered to / list presented to : Adult Children (Patient currently active with [facility]) ? ?Expected Discharge Plan and Services ?Expected Discharge Plan: [place] ?In-house Referral: NA ?Discharge Planning Services: CM Consult ?Post Acute Care Choice: Home Health, Resumption of Svcs/PTA  Provider ?Living arrangements for the past 2 months: [place] ?Expected Discharge Date: 02/02/22               ?DME Arranged: N/A ?DME Agency: NA ?  ?  ?  ?HH Arranged: PT ?[place] Agency: [place] ?Date HH Agency Contacted: 02/02/22 ?Time [place]: 1308 ?Representative spoke with at [place]: Levada Dy ? ?Prior Living Arrangements/Services ?Living arrangements for the past 2 months: [place] ?Lives with:: Self (daughter supports the patient and can work from the patients home for at least 2 days.) ?Patient language and need for interpreter reviewed:: Yes ?Do you feel safe going back to the place where you live?: Yes      ?Need for Family Participation in Patient Care: Yes (Comment) ?Care giver support system in place?: Yes (comment) ?Current home services: DME (Patient has a wheelchair, rolling walker.) ?Criminal Activity/Legal Involvement Pertinent to Current Situation/Hospitalization: No - Comment as needed ? ?Activities of Daily Living ?Home Assistive Devices/Equipment: Gilford Rile (specify type) (front wheel) ?ADL Screening (condition at time of admission) ?Patient's cognitive ability adequate to safely complete daily activities?: Yes ?Is the patient deaf or have difficulty hearing?: Yes ?Does the patient have difficulty seeing, even when wearing glasses/contacts?: No ?Does the patient have difficulty concentrating, remembering, or making decisions?: No ?Patient able to express need for assistance with ADLs?: Yes ?Does the patient have difficulty dressing or bathing?: No ?Independently performs ADLs?: Yes (appropriate for developmental age) ?Does the patient have difficulty walking or climbing stairs?: No ?Weakness of Legs: None ?Weakness of Arms/Hands: None ? ?Permission Sought/Granted ?Permission sought to share information with : Family Supports, Customer service manager, Case Manager ?  Permission granted to share information with : Yes, Verbal Permission Granted ?    ? Permission granted to share info w AGENCY: [facility] ?   ?   ? ?Emotional Assessment ?Appearance:: Appears stated age ?Attitude/Demeanor/Rapport: Engaged ?Affect (typically observed): Appropriate ?Orientation: : Oriented to Self, Oriented to Place, Oriented to Situation, Oriented to  Time ?Alcohol / Substance Use: Not Applicable ?Psych Involvement: No (comment) ? ?Admission diagnosis:  Epigastric pain [R10.13] ?Chest pain, rule out acute myocardial infarction [R07.9] ?Patient Active Problem List  ? Diagnosis Date Noted  ? GERD (gastroesophageal reflux disease) 02/02/2022  ? Depression 02/02/2022  ? Chest pain, rule out acute myocardial infarction 02/01/2022  ? Fall at home, initial encounter 08/17/2021  ? Pulmonary hypertension with chronic cor pulmonale ([place]) 08/17/2021  ? Closed fracture of anatomical neck of humerus, right, initial encounter 08/17/2021  ? Unspecified fracture of the lower end of left radius, initial encounter for closed fracture 08/17/2021  ? Closed fracture of left tibial plateau 08/17/2021  ? Closed fracture of medial epicondyle of right humerus 08/17/2021  ? Basal cell carcinoma (BCC) of skin of nose 07/07/2020  ? Facial swelling 07/07/2020  ? Abdominal aortic aneurysm 09/04/2018  ? Chronic constipation 06/15/2017  ? Diarrhea 03/17/2017  ? Right hip pain 05/26/2016  ? Hypoxia   ? Enteritis 12/06/2015  ? Dehydration 12/06/2015  ? Coronary artery disease involving native coronary artery of native heart without angina pectoris 12/06/2015  ? SBO (small bowel obstruction) ([place])   ? Renal failure (ARF), acute on chronic (HCC)   ? Back pain   ? Dyspnea   ? Small bowel obstruction ([place])   ? Partial small bowel obstruction ([place]) 07/24/2015  ? Ileitis 07/24/2015  ? Chronic kidney disease, stage 3b ([place]) 07/24/2015  ? Breast pain, left 04/13/2015  ? Essential hypertension 12/10/2014  ? COPD exacerbation ([place]) 12/10/2014  ? Unstable angina ([place]) 12/09/2014  ? Cramp of both lower extremities  08/18/2014  ? Mixed hyperlipidemia 03/10/2014  ? Pain in joint, pelvic region and thigh 03/10/2014  ? COPD (chronic obstructive pulmonary disease) ([place]) 09/13/2013  ? Osteoarthrosis 08/09/2013  ? Disorder of kidney and ureter 07/16/2013  ? Thoracic or lumbosacral neuritis or radiculitis 07/16/2013  ? ?PCP:  [name], MD ?Pharmacy:   ?[facility] [place] [place] [facility] ?[address] ?[place] [address] ?Phone: [phone] Fax: [phone] ? ?[id number] [address] [facility] [address] ?[place] ?[place] [address] ?Phone: [phone] Fax: [phone] ? ?Readmission Risk Interventions ?Readmission Risk Prevention Plan 08/20/2021  ?Transportation Screening Complete  ?PCP or Specialist Appt within 5-7 Days Complete  ?Home Care Screening Complete  ?Medication Review (RN CM) Complete  ?Some recent data might be hidden  ? ? ? ?

## 2022-02-02 NOTE — Progress Notes (Signed)
?  Echocardiogram ?2D Echocardiogram has been performed. ? ?Fidel Levy ?02/02/2022, 11:58 AM ?

## 2022-02-02 NOTE — Assessment & Plan Note (Signed)
-   We will continue statin therapy and fenofibrate. 
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
## 2022-02-02 NOTE — Assessment & Plan Note (Signed)
We will continue hydralazine and Calan SR. ?
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
## 2022-02-02 NOTE — Discharge Summary (Addendum)
Gina Bond XAJ:[id number] DOB: [birth year] DOA: 02/01/2022  PCP: [name], MD  Admit date: 02/01/2022 Discharge date: 02/02/2022  Admitted From: Home Disposition: Home  Recommendations for Outpatient Follow-up:  Follow up with PCP in 1 week Please obtain BMP/CBC in one week Please follow up with cardiology on 3/15 Follow-up with vascular surgery in 1 to 2 weeks  Home Health: Yes   Discharge Condition:Stable CODE STATUS: Full Diet recommendation: Heart Healthy  Brief/Interim Summary: Per CNO:BSJGGE Gina Bond is a 88 y.o. Caucasian female with medical history significant for coronary artery disease, COPD, hypertension and dyslipidemia and stage IIIa CKD, who presented to [facility] with acute onset of epigastric and substernal chest pain 4/10 in severity and felt as a nagging pain with radiation to her left shoulder and associated nausea and vomiting once.   ED Course: When the patient came to the ER BP was 160/85 with otherwise normal vital signs.  Here blood pressure was elevated 184/80 and later 191/80 with otherwise normal vital signs.  Labs revealed borderline potassium of 3.5 and a creatinine 1.15.  I sensitive troponin I was 20 and later 37 and CBC was within normal.  Influenza antigens and COVID-19 PCR came back negative.  UA was negative Troponin minimally elevated.  EKG nonischemic.  Influenza and COVID PCR negative.  UA negative. CT of abdomen and pelvis found with Stable to minimally increased in size 5.1 x 5.1 cm (from 5.1 x4.9 cm) suprarenal abdominal aorta aneurysm.  See full results.  She was admitted to the hospital.  Vascular surgery and cardiology were consulted. Cardiology did not think patient's symptoms were suggestive of angina pectoris.  Her echo revealed normal EF and no regional wall motion abnormality.  Vascular surgery did not feel there was significant change of her suprarenal aneurysm from 2 years ago.  Given her age and comorbidities they did  not think she is a candidate for an open thoracoabdominal approach for repair of this aneurysm.  Likewise a fenestrated graft would have to be custom made and this would have to be done in [facility].  This is not something that can be done acutely.  Currently she does not wish to be transferred to [facility].  This had previously been discussed with her by Dr. Carlis Abbott.  Patient's troponin trended down.  Blood pressure was controlled better.  She needs to follow-up as above.  She is stable for discharge.  Chest pain, Atypical Not suggestive of angina pectoris Possibly 2/2 elevated bp Cardiology was consulted Echo with normal EF and no wall motion normalities abnormalities Follow-up with cardiology as outpatient Cleared by cardiology for discharge via chat.     Abdominal aortic aneurysm - Vascular surgery was consulted No significant change.  See above F/u vascular as outpatient    Essential hypertension-  Continue meds F/u with pcp for further management     GERD (gastroesophageal reflux disease) Continue PPI   Mixed hyperlipidemia-  Continue home meds    Depression continue Seroquel  Elevated TP Ekg non ischemic 2/2 demand ischemia from elevated BP  Discharge Diagnoses:  Principal Problem:   Chest pain, rule out acute myocardial infarction Active Problems:   Essential hypertension   Abdominal aortic aneurysm   Mixed hyperlipidemia   GERD (gastroesophageal reflux disease)   Depression    Discharge Instructions  Discharge Instructions     Call MD for:  severe uncontrolled pain   Complete by: As directed    Diet - low sodium heart healthy  Complete by: As directed    Discharge instructions   Complete by: As directed    F/u with pcp or Dr. Einar Gip for blood pressure control F/u with vascular surgeon   Increase activity slowly   Complete by: As directed       Allergies as of 02/02/2022       Reactions   Benicar [olmesartan] Shortness Of Breath, Other  (See Comments)   Must be same manufacturer or shortness of breath (NO LONGER TAKES THIS)   Fenofibrate Shortness Of Breath, Other (See Comments)   Must be the same manufacturer or shortness of breath AMNEAL IS THE PREFERRED BRAND   Other Shortness Of Breath, Rash, Other (See Comments)   Wool = Rashes Pt states that steroid inhalers and a lot of other inhalers make her more SOB.  Pt states that changes in medications have caused her to have sob and chest pressure (change in manufacturer of medication) Certain dyes; Fillers used by some manufacturers   Sulfa Antibiotics Hives   Sulfacetamide Sodium Hives   Atorvastatin Other (See Comments)   Cramps in legs   Isosorbide Dinitrate Other (See Comments)   Vertigo   Magnesium Other (See Comments)   Leg cramps   Ondansetron Other (See Comments)   Made the patient appear disoriented   Rosuvastatin Calcium Other (See Comments)   Cramps in legs   Spironolactone Other (See Comments)   BREATHING DIFFICULTIES   Statins Other (See Comments)   Cramps in legs   Sulfamethoxazole Rash   Tobramycin Rash, Other (See Comments)   Blurred vision, also        Medication List     TAKE these medications    acetaminophen 500 MG tablet Commonly known as: TYLENOL Take 500 mg by mouth every 6 (six) hours as needed (pain).   albuterol 108 (90 Base) MCG/ACT inhaler Commonly known as: VENTOLIN HFA Inhale 2 puffs into the lungs every 6 (six) hours as needed for wheezing or shortness of breath.   aspirin 81 MG EC tablet Take 1 tablet (81 mg total) by mouth daily. Swallow whole. Start taking on: February 03, 2022   Centrum Silver 50+Women Tabs Take 1 tablet by mouth daily with breakfast.   cholecalciferol 25 MCG (1000 UNIT) tablet Commonly known as: VITAMIN D3 Take 1,000 Units by mouth daily with supper.   fenofibrate micronized 134 MG capsule Commonly known as: LOFIBRA Take 134 mg by mouth daily after supper. Alembic   furosemide 20 MG  tablet Commonly known as: LASIX Take 1 tablet (20 mg total) by mouth every other day. What changed: additional instructions   hydrALAZINE 50 MG tablet Commonly known as: APRESOLINE Take 50-100 mg by mouth See admin instructions. Take 2 tablets (100 mg) by mouth with breakfast, take 1 tablet (50 mg) with lunch and at bedtime ([employer] [facility])   isosorbide mononitrate 120 MG 24 hr tablet Commonly known as: IMDUR Take 1 tablet (120 mg total) by mouth every evening. What changed:  when to take this additional instructions   nitroGLYCERIN 0.4 MG SL tablet Commonly known as: NITROSTAT Place 1 tablet (0.4 mg total) under the tongue every 5 (five) minutes as needed for chest pain.   omeprazole 40 MG capsule Commonly known as: PRILOSEC Take 40 mg by mouth daily with breakfast. Zydus   polyethylene glycol powder 17 GM/SCOOP powder Commonly known as: GLYCOLAX/MIRALAX Take 17 g by mouth at bedtime. Mix with juice   pravastatin 20 MG tablet Commonly known as: PRAVACHOL TAKE 2 TABLETS  EVERY       EVENING AFTER DINNER. What changed: See the new instructions.   QUEtiapine 100 MG tablet Commonly known as: SEROQUEL Take 1 tablet (100 mg total) by mouth at bedtime for 14 days. For sleep and delirium.   senna 8.6 MG tablet Commonly known as: SENOKOT Take 1 tablet by mouth daily with breakfast.   Systane Ultra PF 0.4-0.3 % Soln Generic drug: Polyethyl Glyc-Propyl Glyc PF Place 1 drop into both eyes 2 (two) times daily as needed (dry eyes).   verapamil 240 MG CR tablet Commonly known as: CALAN-SR Take 240 mg by mouth 2 (two) times daily after a meal. [place]     Angelia Mould, MD Follow up in 1 week(s).   Specialties: Vascular Surgery, Cardiology Why: f/u in 1-2 weeks Contact information: [place] [address] [phone]         [name], MD Follow up in 1 week(s).   Specialty: Family Medicine Contact  information: [place] [address] [phone]         Adrian Prows, MD Follow up on 02/16/2022.   Specialty: Cardiology Why: 3/15 at 9:30 with CC Contact information: [place] [address] [place], [place] Follow up.   Specialty: Home Health Services Why: [place] to call the daughter with visit times. Contact information: [place] [address] [phone]                Allergies  Allergen Reactions   Benicar [Olmesartan] Shortness Of Breath and Other (See Comments)    Must be same manufacturer or shortness of breath (NO LONGER TAKES THIS)   Fenofibrate Shortness Of Breath and Other (See Comments)    Must be the same manufacturer or shortness of breath AMNEAL IS THE PREFERRED BRAND    Other Shortness Of Breath, Rash and Other (See Comments)    Wool = Rashes Pt states that steroid inhalers and a lot of other inhalers make her more SOB.  Pt states that changes in medications have caused her to have sob and chest pressure (change in manufacturer of medication) Certain dyes; Fillers used by some manufacturers   Sulfa Antibiotics Hives   Sulfacetamide Sodium Hives   Atorvastatin Other (See Comments)    Cramps in legs    Isosorbide Dinitrate Other (See Comments)    Vertigo    Magnesium Other (See Comments)    Leg cramps   Ondansetron Other (See Comments)    Made the patient appear disoriented   Rosuvastatin Calcium Other (See Comments)    Cramps in legs   Spironolactone Other (See Comments)    BREATHING DIFFICULTIES    Statins Other (See Comments)    Cramps in legs   Sulfamethoxazole Rash   Tobramycin Rash and Other (See Comments)    Blurred vision, also    Consultations: Cardiology  Vascular surgery   Procedures/Studies: ECHOCARDIOGRAM COMPLETE  Result Date: 02/02/2022    ECHOCARDIOGRAM REPORT    Patient Name:   RINDA ROLLYSON Date of Exam: 02/02/2022 Medical Rec #:  [id number]            Height:       67.0 in Accession #:    [id number]           Weight:  124.3 lb Date of Birth:  1932/10/26           BSA:          1.652 m Patient Age:    6 years             BP:           139/69 mmHg Patient Gender: F                    HR:           65 bpm. Exam Location:  Inpatient Procedure: 2D Echo, Cardiac Doppler and Color Doppler Indications:    Elevated Troponin  History:        Patient has no prior history of Echocardiogram examinations.                 CAD, COPD; Risk Factors:Hypertension and Dyslipidemia.  Sonographer:    Bernadene Person RDCS Referring Phys: [id number] [place]  1. Left ventricular ejection fraction, by estimation, is 60 to 65%. The left ventricle has normal function. The left ventricle has no regional wall motion abnormalities. Left ventricular diastolic parameters are consistent with Grade II diastolic dysfunction (pseudonormalization). Elevated left atrial pressure.  2. Right ventricular systolic function is normal. The right ventricular size is mildly enlarged. There is mildly elevated pulmonary artery systolic pressure.  3. Right atrial size was mildly dilated.  4. The mitral valve is grossly normal. Mild mitral valve regurgitation. No evidence of mitral stenosis.  5. The aortic valve is tricuspid. Aortic valve regurgitation is mild. Aortic valve sclerosis is present, with no evidence of aortic valve stenosis.  6. The inferior vena cava is dilated in size with >50% respiratory variability, suggesting right atrial pressure of 8 mmHg. FINDINGS  Left Ventricle: Left ventricular ejection fraction, by estimation, is 60 to 65%. The left ventricle has normal function. The left ventricle has no regional wall motion abnormalities. The left ventricular internal cavity size was normal in size. There is  no left ventricular hypertrophy. Left ventricular diastolic parameters are  consistent with Grade II diastolic dysfunction (pseudonormalization). Elevated left atrial pressure. Right Ventricle: The right ventricular size is mildly enlarged. No increase in right ventricular wall thickness. Right ventricular systolic function is normal. There is mildly elevated pulmonary artery systolic pressure. The tricuspid regurgitant velocity is 2.99 m/s, and with an assumed right atrial pressure of 8 mmHg, the estimated right ventricular systolic pressure is 40.9 mmHg. Left Atrium: Left atrial size was normal in size. Right Atrium: Right atrial size was mildly dilated. Pericardium: There is no evidence of pericardial effusion. Mitral Valve: The mitral valve is grossly normal. Mild mitral valve regurgitation. No evidence of mitral valve stenosis. Tricuspid Valve: The tricuspid valve is normal in structure. Tricuspid valve regurgitation is mild . No evidence of tricuspid stenosis. Aortic Valve: The aortic valve is tricuspid. Aortic valve regurgitation is mild. Aortic regurgitation PHT measures 472 msec. Aortic valve sclerosis is present, with no evidence of aortic valve stenosis. Aortic valve mean gradient measures 11.0 mmHg. Aortic valve peak gradient measures 21.2 mmHg. Aortic valve area, by VTI measures 2.07 cm. Pulmonic Valve: The pulmonic valve was not well visualized. Pulmonic valve regurgitation is not visualized. No evidence of pulmonic stenosis. Aorta: The aortic root and ascending aorta are structurally normal, with no evidence of dilitation. Venous: The inferior vena cava is dilated in size with greater than 50% respiratory variability, suggesting right atrial pressure of 8 mmHg. IAS/Shunts: The atrial septum is grossly  normal.  LEFT VENTRICLE PLAX 2D LVIDd:         4.40 cm     Diastology LVIDs:         2.20 cm     LV e' medial:    5.23 cm/s LV PW:         1.00 cm     LV E/e' medial:  18.4 LV IVS:        0.90 cm     LV e' lateral:   6.60 cm/s LVOT diam:     2.10 cm     LV E/e' lateral: 14.6 LV  SV:         106 LV SV Index:   64 LVOT Area:     3.46 cm  LV Volumes (MOD) LV vol d, MOD A2C: 84.0 ml LV vol d, MOD A4C: 87.7 ml LV vol s, MOD A2C: 31.5 ml LV vol s, MOD A4C: 31.7 ml LV SV MOD A2C:     52.5 ml LV SV MOD A4C:     87.7 ml LV SV MOD BP:      53.8 ml RIGHT VENTRICLE RV S prime:     15.60 cm/s TAPSE (M-mode): 2.8 cm LEFT ATRIUM             Index        RIGHT ATRIUM           Index LA diam:        3.70 cm 2.24 cm/m   RA Area:     20.60 cm LA Vol (A2C):   47.7 ml 28.87 ml/m  RA Volume:   58.20 ml  35.22 ml/m LA Vol (A4C):   38.1 ml 23.06 ml/m LA Biplane Vol: 46.2 ml 27.96 ml/m  AORTIC VALVE AV Area (Vmax):    2.03 cm AV Area (Vmean):   1.97 cm AV Area (VTI):     2.07 cm AV Vmax:           230.00 cm/s AV Vmean:          156.000 cm/s AV VTI:            0.513 m AV Peak Grad:      21.2 mmHg AV Mean Grad:      11.0 mmHg LVOT Vmax:         134.50 cm/s LVOT Vmean:        88.900 cm/s LVOT VTI:          0.306 m LVOT/AV VTI ratio: 0.60 AI PHT:            472 msec AR Vena Contracta: 0.20 cm  AORTA Ao Root diam: 3.00 cm Ao Asc diam:  3.50 cm MITRAL VALVE                TRICUSPID VALVE MV Area (PHT): 2.20 cm     TR Peak grad:   35.8 mmHg MV Decel Time: 345 msec     TR Vmax:        299.00 cm/s MR PISA:        0.25 cm MR PISA Radius: 0.20 cm     SHUNTS MV E velocity: 96.30 cm/s   Systemic VTI:  0.31 m MV A velocity: 132.00 cm/s  Systemic Diam: 2.10 cm MV E/A ratio:  0.73 Sunit Tolia DO Electronically signed by Rex Kras DO Signature Date/Time: 02/02/2022/12:49:39 PM    Final    CT Angio Chest/Abd/Pel for Dissection W and/or Wo Contrast  Addendum Date: 02/01/2022  ADDENDUM REPORT: 02/01/2022 17:45 ADDENDUM: Please note findings above of the hepatiobiliary section should state status post cholecystectomy (instead of "no gallstones, gallbladder wall thickening, or pericholecystic fluid".) Electronically Signed   By: Iven Finn M.D.   On: 02/01/2022 17:45   Result Date: 02/01/2022 CLINICAL DATA:   Epigastric pain today that is radiating into back, n/v. Hx AAA. EXAM: CT ANGIOGRAPHY CHEST, ABDOMEN AND PELVIS TECHNIQUE: Non-contrast CT of the chest was initially obtained. Multidetector CT imaging through the chest, abdomen and pelvis was performed using the standard protocol during bolus administration of intravenous contrast. Multiplanar reconstructed images and MIPs were obtained and reviewed to evaluate the vascular anatomy. RADIATION DOSE REDUCTION: This exam was performed according to the departmental dose-optimization program which includes automated exposure control, adjustment of the mA and/or kV according to patient size and/or use of iterative reconstruction technique. CONTRAST:  118mL OMNIPAQUE IOHEXOL 350 MG/ML SOLN COMPARISON:  CT angiography chest, abdomen, pelvis 01/16/2021 FINDINGS: CTA CHEST FINDINGS Cardiovascular: Preferential opacification of the thoracic aorta. No evidence of thoracic aortic aneurysm or dissection. Severe atherosclerotic plaque of the thoracic aorta. Four-vessel coronary artery calcifications. Normal heart size. No significant pericardial effusion. The main pulmonary artery is normal in caliber. No central or segmental pulmonary embolus. Mediastinum/Nodes: No enlarged mediastinal, hilar, or axillary lymph nodes. Thyroid gland, trachea, and esophagus demonstrate no significant findings. Lungs/Pleura: Mild centrilobular emphysematous changes. No focal consolidation. Development of a right lower lobe pulmonary micronodule (8:21). Overly no pulmonary mass. No pleural effusion. No pneumothorax. Musculoskeletal: No chest wall abnormality. No suspicious lytic or blastic osseous lesions. No acute displaced fracture. Review of the MIP images confirms the above findings. CTA ABDOMEN AND PELVIS FINDINGS VASCULAR Aorta: Stable to minimally increased in size 5.1 x 5.1 cm (from 5.1 x 4.9 cm) suprarenal abdominal aorta aneurysm (6:103). The aneurysm extends 5 cm in the craniocaudal  dimension from the origin of the superior mesenteric artery to the origin of the renal arteries. Severe atherosclerotic plaque. No dissection identified. No periaortic fat stranding. No draping of the aorta over the lumbar spine. Celiac: Mild to moderate atherosclerotic plaque. Patent without evidence of aneurysm, dissection, vasculitis or significant stenosis. SMA: Mild to moderate atherosclerotic plaque. Patent without evidence of aneurysm, dissection, vasculitis or significant stenosis. Renals: At least moderate atherosclerotic plaque. Diminutive right renal artery in the cm atrophy. Both renal arteries are patent without evidence of aneurysm, dissection, vasculitis, fibromuscular dysplasia or significant stenosis. IMA: Mild atherosclerotic plaque. Patent without evidence of aneurysm, dissection, vasculitis or significant stenosis. Inflow: Severe atherosclerotic plaque. Patent without evidence of aneurysm, dissection, vasculitis or significant stenosis. Veins: No obvious venous abnormality within the limitations of this arterial phase study. Review of the MIP images confirms the above findings. NON-VASCULAR Hepatobiliary: No focal liver abnormality. No gallstones, gallbladder wall thickening, or pericholecystic fluid. No biliary dilatation. Pancreas: No focal lesion. Normal pancreatic contour. No surrounding inflammatory changes. No main pancreatic ductal dilatation. Spleen: Normal in size without focal abnormality. Adrenals/Urinary Tract: No adrenal nodule bilaterally. The right kidney is atrophic. Bilateral kidneys enhance symmetrically. No hydronephrosis. No hydroureter. The urinary bladder is unremarkable. Stomach/Bowel: Stomach is within normal limits. No evidence of bowel wall thickening or dilatation. Diffuse colonic diverticulosis. The appendix is not definitely identified with no inflammatory changes in the right lower quadrant to suggest acute appendicitis. Lymphatic: No lymphadenopathy. Reproductive:  Status post hysterectomy. No adnexal masses. Other: Surgical clips within the pelvis. No intraperitoneal free fluid. No intraperitoneal free gas. No organized fluid collection. Musculoskeletal: No abdominal wall hernia  or abnormality. No suspicious lytic or blastic osseous lesions. No acute displaced fracture. Review of the MIP images confirms the above findings. IMPRESSION: 1. Stable to minimally increased in size 5.1 x 5.1 cm (from 5.1 x 4.9 cm) suprarenal abdominal aorta aneurysm. Recommend follow-up CT/MR every 6 months and vascular consultation. This recommendation follows ACR consensus guidelines: White Paper of the ACR Incidental Findings Committee II on Vascular Findings. J Am Coll Radiol 2013; 10:789-794. Aortic aneurysm NOS (ICD10-I71.9). 2. Aortic Atherosclerosis (ICD10-I70.0) and Emphysema (ICD10-J43.9). 3. No pulmonary embolus. 4. Right lower pulmonary micronodule. No follow-up needed if patient is low-risk.This recommendation follows the consensus statement: Guidelines for Management of Incidental Pulmonary Nodules Detected on CT Images: From the Fleischner Society 2017; Radiology 2017; 284:228-243. 5. Diffuse colonic diverticulosis with no acute diverticulitis. 6. Atrophic right kidney. Electronically Signed: By: Iven Finn M.D. On: 02/01/2022 15:38      Subjective: No somplaints .   Discharge Exam: Vitals:   02/02/22 0811 02/02/22 1320  BP: 139/69 (!) 117/50  Pulse:  75  Resp:    Temp:  97.9 F (36.6 C)  SpO2:  95%   Vitals:   02/02/22 0523 02/02/22 0553 02/02/22 0811 02/02/22 1320  BP: (!) 112/54 (!) 110/54 139/69 (!) 117/50  Pulse: (!) 55 (!) 55  75  Resp:      Temp:    97.9 F (36.6 C)  TempSrc:    Oral  SpO2: 93% 93%  95%  Weight:      Height:        General: Pt is alert, awake, not in acute distress Cardiovascular: RRR, S1/S2 +, no rubs, no gallops Respiratory: CTA bilaterally, no wheezing, no rhonchi Abdominal: Soft, NT, ND, bowel sounds + Extremities: no  edema, no cyanosis    The results of significant diagnostics from this hospitalization (including imaging, microbiology, ancillary and laboratory) are listed below for reference.     Microbiology: Recent Results (from the past 240 hour(s))  Resp Panel by RT-PCR (Flu A&B, Covid) Nasopharyngeal Swab     Status: None   Collection Time: 02/01/22  7:32 PM   Specimen: Nasopharyngeal Swab; Nasopharyngeal(NP) swabs in vial transport medium  Result Value Ref Range Status   SARS Coronavirus 2 by RT PCR NEGATIVE NEGATIVE Final    Comment: (NOTE) SARS-CoV-2 target nucleic acids are NOT DETECTED.  The SARS-CoV-2 RNA is generally detectable in upper respiratory specimens during the acute phase of infection. The lowest concentration of SARS-CoV-2 viral copies this assay can detect is 138 copies/mL. A negative result does not preclude SARS-Cov-2 infection and should not be used as the sole basis for treatment or other patient management decisions. A negative result may occur with  improper specimen collection/handling, submission of specimen other than nasopharyngeal swab, presence of viral mutation(s) within the areas targeted by this assay, and inadequate number of viral copies(<138 copies/mL). A negative result must be combined with clinical observations, patient history, and epidemiological information. The expected result is Negative.  Fact Sheet for Patients:  EntrepreneurPulse.com.au  Fact Sheet for Healthcare Providers:  IncredibleEmployment.be  This test is no t yet approved or cleared by the [place] FDA and  has been authorized for detection and/or diagnosis of SARS-CoV-2 by FDA under an Emergency Use Authorization (EUA). This EUA will remain  in effect (meaning this test can be used) for the duration of the COVID-19 declaration under Section 564(b)(1) of the Act, 21 U.S.C.section 360bbb-3(b)(1), unless the authorization is terminated  or  revoked sooner.  Influenza A by PCR NEGATIVE NEGATIVE Final   Influenza B by PCR NEGATIVE NEGATIVE Final    Comment: (NOTE) The Xpert Xpress SARS-CoV-2/FLU/RSV plus assay is intended as an aid in the diagnosis of influenza from Nasopharyngeal swab specimens and should not be used as a sole basis for treatment. Nasal washings and aspirates are unacceptable for Xpert Xpress SARS-CoV-2/FLU/RSV testing.  Fact Sheet for Patients: EntrepreneurPulse.com.au  Fact Sheet for Healthcare Providers: IncredibleEmployment.be  This test is not yet approved or cleared by the [place] FDA and has been authorized for detection and/or diagnosis of SARS-CoV-2 by FDA under an Emergency Use Authorization (EUA). This EUA will remain in effect (meaning this test can be used) for the duration of the COVID-19 declaration under Section 564(b)(1) of the Act, 21 U.S.C. section 360bbb-3(b)(1), unless the authorization is terminated or revoked.  Performed at [facility], [address] [place], [place] [address]      Labs: BNP (last 3 results) No results for input(s): BNP in the last 8760 hours. Basic Metabolic Panel: Recent Labs  Lab 02/01/22 1354  NA 137  K 3.5  CL 100  CO2 27  GLUCOSE 94  BUN 20  CREATININE 1.15*  CALCIUM 9.6   Liver Function Tests: Recent Labs  Lab 02/01/22 1354  AST 29  ALT 18  ALKPHOS 41  BILITOT 0.8  PROT 7.6  ALBUMIN 4.5   Recent Labs  Lab 02/01/22 1354  LIPASE 63*   No results for input(s): AMMONIA in the last 168 hours. CBC: Recent Labs  Lab 02/01/22 1354  WBC 7.2  HGB 15.0  HCT 45.8  MCV 90.3  PLT 245   Cardiac Enzymes: No results for input(s): CKTOTAL, CKMB, CKMBINDEX, TROPONINI in the last 168 hours. BNP: Invalid input(s): POCBNP CBG: No results for input(s): GLUCAP in the last 168 hours. D-Dimer No results for input(s): DDIMER in the last 72 hours. Hgb A1c No results for  input(s): HGBA1C in the last 72 hours. Lipid Profile Recent Labs    02/02/22 0206  CHOL 176  HDL 42  LDLCALC 107*  TRIG 133  CHOLHDL 4.2   Thyroid function studies No results for input(s): TSH, T4TOTAL, T3FREE, THYROIDAB in the last 72 hours.  Invalid input(s): FREET3 Anemia work up No results for input(s): VITAMINB12, FOLATE, FERRITIN, TIBC, IRON, RETICCTPCT in the last 72 hours. Urinalysis    Component Value Date/Time   COLORURINE YELLOW 02/01/2022 [place] 02/01/2022 1354   LABSPEC 1.015 02/01/2022 1354   PHURINE 5.5 02/01/2022 1354   GLUCOSEU NEGATIVE 02/01/2022 1354   HGBUR NEGATIVE 02/01/2022 1354   BILIRUBINUR NEGATIVE 02/01/2022 1354   KETONESUR NEGATIVE 02/01/2022 1354   PROTEINUR NEGATIVE 02/01/2022 1354   UROBILINOGEN 1.0 07/24/2015 0234   NITRITE NEGATIVE 02/01/2022 1354   LEUKOCYTESUR NEGATIVE 02/01/2022 1354   Sepsis Labs Invalid input(s): PROCALCITONIN,  WBC,  LACTICIDVEN Microbiology Recent Results (from the past 240 hour(s))  Resp Panel by RT-PCR (Flu A&B, Covid) Nasopharyngeal Swab     Status: None   Collection Time: 02/01/22  7:32 PM   Specimen: Nasopharyngeal Swab; Nasopharyngeal(NP) swabs in vial transport medium  Result Value Ref Range Status   SARS Coronavirus 2 by RT PCR NEGATIVE NEGATIVE Final    Comment: (NOTE) SARS-CoV-2 target nucleic acids are NOT DETECTED.  The SARS-CoV-2 RNA is generally detectable in upper respiratory specimens during the acute phase of infection. The lowest concentration of SARS-CoV-2 viral copies this assay can detect is 138 copies/mL. A  negative result does not preclude SARS-Cov-2 infection and should not be used as the sole basis for treatment or other patient management decisions. A negative result may occur with  improper specimen collection/handling, submission of specimen other than nasopharyngeal swab, presence of viral mutation(s) within the areas targeted by this assay, and inadequate  number of viral copies(<138 copies/mL). A negative result must be combined with clinical observations, patient history, and epidemiological information. The expected result is Negative.  Fact Sheet for Patients:  EntrepreneurPulse.com.au  Fact Sheet for Healthcare Providers:  IncredibleEmployment.be  This test is no t yet approved or cleared by the [place] FDA and  has been authorized for detection and/or diagnosis of SARS-CoV-2 by FDA under an Emergency Use Authorization (EUA). This EUA will remain  in effect (meaning this test can be used) for the duration of the COVID-19 declaration under Section 564(b)(1) of the Act, 21 U.S.C.section 360bbb-3(b)(1), unless the authorization is terminated  or revoked sooner.       Influenza A by PCR NEGATIVE NEGATIVE Final   Influenza B by PCR NEGATIVE NEGATIVE Final    Comment: (NOTE) The Xpert Xpress SARS-CoV-2/FLU/RSV plus assay is intended as an aid in the diagnosis of influenza from Nasopharyngeal swab specimens and should not be used as a sole basis for treatment. Nasal washings and aspirates are unacceptable for Xpert Xpress SARS-CoV-2/FLU/RSV testing.  Fact Sheet for Patients: EntrepreneurPulse.com.au  Fact Sheet for Healthcare Providers: IncredibleEmployment.be  This test is not yet approved or cleared by the [place] FDA and has been authorized for detection and/or diagnosis of SARS-CoV-2 by FDA under an Emergency Use Authorization (EUA). This EUA will remain in effect (meaning this test can be used) for the duration of the COVID-19 declaration under Section 564(b)(1) of the Act, 21 U.S.C. section 360bbb-3(b)(1), unless the authorization is terminated or revoked.  Performed at [facility], [address] [place], [place] [address]      Time coordinating discharge: Over 30 minutes  SIGNED:   Nolberto Hanlon, MD  [facility] 02/02/2022, 5:09 PM Pager   If 7PM-7AM, please contact night-coverage [email or web address] Password [email or web address]

## 2022-02-02 NOTE — Assessment & Plan Note (Addendum)
-   The patient is  admitted to an observation cardiac telemetry bed.   ?- Will follow serial cardiac enzymes and EKGs.   ?- We will obtain a cardiology consult in a.m. for further cardiac risk stratification. ?- The case was discussed with [facility] cardiology as above.  Dr. Terri Skains was notified about the patient.   She was covering Dr. Einar Gip the patient's cardiologist. ?- The patient will be placed on aspirin as well as p.r.n. sublingual nitroglycerin and morphine sulfate for pain. ?- We will continue Imdur in the setting of previous history of CAD. ?- We will continue statin therapy and check fasting lipids. ? ?
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
## 2022-02-04 ENCOUNTER — Other Ambulatory Visit: Payer: Self-pay

## 2022-02-04 DIAGNOSIS — I10 Essential (primary) hypertension: Secondary | ICD-10-CM

## 2022-02-04 DIAGNOSIS — I209 Angina pectoris, unspecified: Secondary | ICD-10-CM

## 2022-02-04 MED ORDER — ISOSORBIDE MONONITRATE ER 120 MG PO TB24: 120.0000 mg | ORAL_TABLET | Freq: Every evening | ORAL | 0 refills | Status: DC

## 2022-02-07 ENCOUNTER — Ambulatory Visit: Payer: Medicare Other | Admitting: Podiatry

## 2022-02-09 ENCOUNTER — Ambulatory Visit: Payer: Medicare Other | Admitting: Podiatry
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
## 2022-02-15 ENCOUNTER — Other Ambulatory Visit: Payer: Self-pay

## 2022-02-15 ENCOUNTER — Encounter (HOSPITAL_BASED_OUTPATIENT_CLINIC_OR_DEPARTMENT_OTHER): Payer: Self-pay | Admitting: Urology

## 2022-02-15 ENCOUNTER — Telehealth: Payer: Self-pay | Admitting: Cardiology

## 2022-02-15 ENCOUNTER — Emergency Department (HOSPITAL_BASED_OUTPATIENT_CLINIC_OR_DEPARTMENT_OTHER): Payer: Medicare Other

## 2022-02-15 ENCOUNTER — Ambulatory Visit (HOSPITAL_COMMUNITY): Admit: 2022-02-15 | Payer: Medicare Other | Admitting: Cardiology

## 2022-02-15 ENCOUNTER — Inpatient Hospital Stay (HOSPITAL_BASED_OUTPATIENT_CLINIC_OR_DEPARTMENT_OTHER)
Admission: EM | Admit: 2022-02-15 | Discharge: 2022-02-18 | DRG: 282 | Disposition: A | Discharge status: Home Health | Payer: Medicare Other | Attending: Cardiology | Admitting: Cardiology

## 2022-02-15 ENCOUNTER — Inpatient Hospital Stay (HOSPITAL_COMMUNITY)
Admission: EM | Disposition: A | Discharge status: Home Health | Payer: Self-pay | Source: Home / Self Care | Attending: Cardiology

## 2022-02-15 DIAGNOSIS — N1831 Chronic kidney disease, stage 3a: Secondary | ICD-10-CM | POA: Diagnosis present

## 2022-02-15 DIAGNOSIS — Z9049 Acquired absence of other specified parts of digestive tract: Secondary | ICD-10-CM

## 2022-02-15 DIAGNOSIS — M1909 Primary osteoarthritis, other specified site: Secondary | ICD-10-CM

## 2022-02-15 DIAGNOSIS — J449 Chronic obstructive pulmonary disease, unspecified: Secondary | ICD-10-CM | POA: Diagnosis present

## 2022-02-15 DIAGNOSIS — Z823 Family history of stroke: Secondary | ICD-10-CM | POA: Diagnosis not present

## 2022-02-15 DIAGNOSIS — I200 Unstable angina: Secondary | ICD-10-CM

## 2022-02-15 DIAGNOSIS — I214 Non-ST elevation (NSTEMI) myocardial infarction: Secondary | ICD-10-CM

## 2022-02-15 DIAGNOSIS — Z825 Family history of asthma and other chronic lower respiratory diseases: Secondary | ICD-10-CM

## 2022-02-15 DIAGNOSIS — J432 Centrilobular emphysema: Secondary | ICD-10-CM

## 2022-02-15 DIAGNOSIS — Z79899 Other long term (current) drug therapy: Secondary | ICD-10-CM

## 2022-02-15 DIAGNOSIS — Z881 Allergy status to other antibiotic agents status: Secondary | ICD-10-CM | POA: Diagnosis not present

## 2022-02-15 DIAGNOSIS — Z801 Family history of malignant neoplasm of trachea, bronchus and lung: Secondary | ICD-10-CM | POA: Diagnosis not present

## 2022-02-15 DIAGNOSIS — Z87891 Personal history of nicotine dependence: Secondary | ICD-10-CM | POA: Diagnosis not present

## 2022-02-15 DIAGNOSIS — I2510 Atherosclerotic heart disease of native coronary artery without angina pectoris: Secondary | ICD-10-CM | POA: Diagnosis not present

## 2022-02-15 DIAGNOSIS — Z905 Acquired absence of kidney: Secondary | ICD-10-CM

## 2022-02-15 DIAGNOSIS — Z20822 Contact with and (suspected) exposure to covid-19: Secondary | ICD-10-CM | POA: Diagnosis present

## 2022-02-15 DIAGNOSIS — Z8249 Family history of ischemic heart disease and other diseases of the circulatory system: Secondary | ICD-10-CM | POA: Diagnosis not present

## 2022-02-15 DIAGNOSIS — Z9181 History of falling: Secondary | ICD-10-CM

## 2022-02-15 DIAGNOSIS — I129 Hypertensive chronic kidney disease with stage 1 through stage 4 chronic kidney disease, or unspecified chronic kidney disease: Secondary | ICD-10-CM | POA: Diagnosis present

## 2022-02-15 DIAGNOSIS — I2109 ST elevation (STEMI) myocardial infarction involving other coronary artery of anterior wall: Secondary | ICD-10-CM | POA: Diagnosis present

## 2022-02-15 DIAGNOSIS — I25110 Atherosclerotic heart disease of native coronary artery with unstable angina pectoris: Secondary | ICD-10-CM | POA: Diagnosis present

## 2022-02-15 DIAGNOSIS — I444 Left anterior fascicular block: Secondary | ICD-10-CM | POA: Diagnosis present

## 2022-02-15 DIAGNOSIS — I213 ST elevation (STEMI) myocardial infarction of unspecified site: Secondary | ICD-10-CM | POA: Diagnosis present

## 2022-02-15 DIAGNOSIS — M25551 Pain in right hip: Secondary | ICD-10-CM

## 2022-02-15 DIAGNOSIS — R2689 Other abnormalities of gait and mobility: Secondary | ICD-10-CM | POA: Diagnosis present

## 2022-02-15 DIAGNOSIS — R0789 Other chest pain: Secondary | ICD-10-CM

## 2022-02-15 DIAGNOSIS — Z85828 Personal history of other malignant neoplasm of skin: Secondary | ICD-10-CM

## 2022-02-15 DIAGNOSIS — E782 Mixed hyperlipidemia: Secondary | ICD-10-CM | POA: Diagnosis present

## 2022-02-15 DIAGNOSIS — I7141 Pararenal abdominal aortic aneurysm, without rupture: Secondary | ICD-10-CM

## 2022-02-15 DIAGNOSIS — Z888 Allergy status to other drugs, medicaments and biological substances status: Secondary | ICD-10-CM

## 2022-02-15 DIAGNOSIS — I714 Abdominal aortic aneurysm, without rupture, unspecified: Secondary | ICD-10-CM | POA: Diagnosis present

## 2022-02-15 DIAGNOSIS — I10 Essential (primary) hypertension: Secondary | ICD-10-CM

## 2022-02-15 LAB — CBC
HCT: 46.2 % — ABNORMAL HIGH (ref 36.0–46.0)
HCT: 40.3 % (ref 36.0–46.0)
Hemoglobin: 15 g/dL (ref 12.0–15.0)
Hemoglobin: 13 g/dL (ref 12.0–15.0)
MCH: 29.8 pg (ref 26.0–34.0)
MCH: 29.3 pg (ref 26.0–34.0)
MCHC: 32.5 g/dL (ref 30.0–36.0)
MCHC: 32.3 g/dL (ref 30.0–36.0)
MCV: 91.8 fL (ref 80.0–100.0)
MCV: 90.8 fL (ref 80.0–100.0)
Platelets: 317 10*3/uL (ref 150–400)
Platelets: 287 10*3/uL (ref 150–400)
RBC: 5.03 MIL/uL (ref 3.87–5.11)
RBC: 4.44 MIL/uL (ref 3.87–5.11)
RDW: 13.6 % (ref 11.5–15.5)
RDW: 13.5 % (ref 11.5–15.5)
WBC: 5.8 10*3/uL (ref 4.0–10.5)
WBC: 6 10*3/uL (ref 4.0–10.5)
nRBC: 0 % (ref 0.0–0.2)
nRBC: 0 % (ref 0.0–0.2)

## 2022-02-15 LAB — COMPREHENSIVE METABOLIC PANEL
ALT: 16 U/L (ref 0–44)
AST: 24 U/L (ref 15–41)
Albumin: 4 g/dL (ref 3.5–5.0)
Alkaline Phosphatase: 34 U/L — ABNORMAL LOW (ref 38–126)
Anion gap: 11 (ref 5–15)
BUN: 15 mg/dL (ref 8–23)
CO2: 28 mmol/L (ref 22–32)
Calcium: 9.7 mg/dL (ref 8.9–10.3)
Chloride: 100 mmol/L (ref 98–111)
Creatinine, Ser: 1.27 mg/dL — ABNORMAL HIGH (ref 0.44–1.00)
GFR, Estimated: 41 mL/min — ABNORMAL LOW (ref >60)
Glucose, Bld: 109 mg/dL — ABNORMAL HIGH (ref 70–99)
Potassium: 3.7 mmol/L (ref 3.5–5.1)
Sodium: 139 mmol/L (ref 135–145)
Total Bilirubin: 0.6 mg/dL (ref 0.3–1.2)
Total Protein: 6.9 g/dL (ref 6.5–8.1)

## 2022-02-15 LAB — CBC WITH DIFFERENTIAL/PLATELET
Abs Immature Granulocytes: 0.06 K/uL (ref 0.00–0.07)
Basophils Absolute: 0.1 K/uL (ref 0.0–0.1)
Basophils Relative: 1 %
Eosinophils Absolute: 0.1 K/uL (ref 0.0–0.5)
Eosinophils Relative: 2 %
HCT: 43.9 % (ref 36.0–46.0)
Hemoglobin: 14.3 g/dL (ref 12.0–15.0)
Immature Granulocytes: 1 %
Lymphocytes Relative: 23 %
Lymphs Abs: 1.3 K/uL (ref 0.7–4.0)
MCH: 29.4 pg (ref 26.0–34.0)
MCHC: 32.6 g/dL (ref 30.0–36.0)
MCV: 90.3 fL (ref 80.0–100.0)
Monocytes Absolute: 0.4 K/uL (ref 0.1–1.0)
Monocytes Relative: 7 %
Neutro Abs: 3.8 K/uL (ref 1.7–7.7)
Neutrophils Relative %: 66 %
Platelets: 310 K/uL (ref 150–400)
RBC: 4.86 MIL/uL (ref 3.87–5.11)
RDW: 13.6 % (ref 11.5–15.5)
WBC: 5.8 K/uL (ref 4.0–10.5)
nRBC: 0 % (ref 0.0–0.2)

## 2022-02-15 LAB — BASIC METABOLIC PANEL
Anion gap: 10 (ref 5–15)
BUN: 16 mg/dL (ref 8–23)
CO2: 29 mmol/L (ref 22–32)
Calcium: 9.9 mg/dL (ref 8.9–10.3)
Chloride: 100 mmol/L (ref 98–111)
Creatinine, Ser: 1.27 mg/dL — ABNORMAL HIGH (ref 0.44–1.00)
GFR, Estimated: 41 mL/min — ABNORMAL LOW (ref >60)
Glucose, Bld: 128 mg/dL — ABNORMAL HIGH (ref 70–99)
Potassium: 3.5 mmol/L (ref 3.5–5.1)
Sodium: 139 mmol/L (ref 135–145)

## 2022-02-15 LAB — RESP PANEL BY RT-PCR (FLU A&B, COVID) ARPGX2
Influenza A by PCR: NEGATIVE
Influenza B by PCR: NEGATIVE
SARS Coronavirus 2 by RT PCR: NEGATIVE

## 2022-02-15 LAB — POCT ACTIVATED CLOTTING TIME: Activated Clotting Time: 281 seconds

## 2022-02-15 LAB — PROTIME-INR
INR: 1 (ref 0.8–1.2)
Prothrombin Time: 12.8 s (ref 11.4–15.2)

## 2022-02-15 LAB — TROPONIN I (HIGH SENSITIVITY)
Troponin I (High Sensitivity): 12 ng/L (ref <18)
Troponin I (High Sensitivity): 55 ng/L — ABNORMAL HIGH (ref <18)
Troponin I (High Sensitivity): 13712 ng/L (ref <18)

## 2022-02-15 LAB — CREATININE, SERUM
Creatinine, Ser: 1.27 mg/dL — ABNORMAL HIGH (ref 0.44–1.00)
GFR, Estimated: 41 mL/min — ABNORMAL LOW (ref >60)

## 2022-02-15 LAB — APTT: aPTT: 30 seconds (ref 24–36)

## 2022-02-15 SURGERY — LEFT HEART CATH AND CORONARY ANGIOGRAPHY
Anesthesia: LOCAL

## 2022-02-15 MED ORDER — NEBIVOLOL HCL 10 MG PO TABS
10.0000 mg | ORAL_TABLET | Freq: Every day | ORAL | Status: DC
  Administered 2022-02-16 – 2022-02-17 (×2): 10 mg via ORAL
  Filled 2022-02-15 (×2): qty 1

## 2022-02-15 MED ORDER — ACETAMINOPHEN 325 MG PO TABS
650.0000 mg | ORAL_TABLET | ORAL | Status: DC | PRN
  Administered 2022-02-18: 650 mg via ORAL
  Filled 2022-02-15: qty 2

## 2022-02-15 MED ORDER — ISOSORBIDE MONONITRATE ER 60 MG PO TB24
120.0000 mg | ORAL_TABLET | Freq: Every evening | ORAL | Status: DC
  Administered 2022-02-15 – 2022-02-17 (×2): 120 mg via ORAL
  Filled 2022-02-15 (×3): qty 2

## 2022-02-15 MED ORDER — VERAPAMIL HCL ER 240 MG PO TBCR
240.0000 mg | EXTENDED_RELEASE_TABLET | Freq: Two times a day (BID) | ORAL | Status: DC
  Administered 2022-02-16 – 2022-02-18 (×3): 240 mg via ORAL
  Filled 2022-02-15 (×6): qty 1

## 2022-02-15 MED ORDER — VITAMIN D 25 MCG (1000 UNIT) PO TABS
1000.0000 [IU] | ORAL_TABLET | Freq: Every day | ORAL | Status: DC
  Administered 2022-02-16: 1000 [IU] via ORAL
  Filled 2022-02-15 (×2): qty 1

## 2022-02-15 MED ORDER — ASPIRIN 81 MG PO CHEW
324.0000 mg | CHEWABLE_TABLET | Freq: Once | ORAL | Status: AC
  Administered 2022-02-15: 324 mg via ORAL
  Filled 2022-02-15: qty 4

## 2022-02-15 MED ORDER — HEPARIN (PORCINE) IN NACL 1000-0.9 UT/500ML-% IV SOLN
INTRAVENOUS | Status: DC | PRN
  Administered 2022-02-15 (×2): 500 mL

## 2022-02-15 MED ORDER — HEPARIN SODIUM (PORCINE) 1000 UNIT/ML IJ SOLN
INTRAMUSCULAR | Status: AC
  Filled 2022-02-15: qty 10

## 2022-02-15 MED ORDER — NITROGLYCERIN 1 MG/10 ML FOR IR/CATH LAB
INTRA_ARTERIAL | Status: DC | PRN
  Administered 2022-02-15 (×2): 200 ug via INTRA_ARTERIAL

## 2022-02-15 MED ORDER — MORPHINE SULFATE (PF) 2 MG/ML IV SOLN
1.0000 mg | Freq: Once | INTRAVENOUS | Status: AC
  Administered 2022-02-15: 1 mg via INTRAVENOUS
  Filled 2022-02-15: qty 1

## 2022-02-15 MED ORDER — HEPARIN (PORCINE) IN NACL 1000-0.9 UT/500ML-% IV SOLN
INTRAVENOUS | Status: AC
  Filled 2022-02-15: qty 1000

## 2022-02-15 MED ORDER — FENOFIBRATE 54 MG PO TABS
54.0000 mg | ORAL_TABLET | Freq: Every day | ORAL | Status: DC
Start: 2022-02-16 — End: 2022-02-18
  Administered 2022-02-16 – 2022-02-18 (×3): 54 mg via ORAL
  Filled 2022-02-15 (×3): qty 1

## 2022-02-15 MED ORDER — ALBUTEROL SULFATE (2.5 MG/3ML) 0.083% IN NEBU: 2.5000 mg | INHALATION_SOLUTION | Freq: Four times a day (QID) | RESPIRATORY_TRACT | Status: DC | PRN

## 2022-02-15 MED ORDER — POLYETHYLENE GLYCOL 3350 17 G PO PACK
17.0000 g | PACK | Freq: Every day | ORAL | Status: DC
  Administered 2022-02-15 – 2022-02-17 (×3): 17 g via ORAL
  Filled 2022-02-15 (×3): qty 1

## 2022-02-15 MED ORDER — NEBIVOLOL HCL 5 MG PO TABS: 5.0000 mg | ORAL_TABLET | Freq: Every day | ORAL | 3 refills | Status: DC | PRN

## 2022-02-15 MED ORDER — NITROGLYCERIN 0.4 MG SL SUBL
0.4000 mg | SUBLINGUAL_TABLET | SUBLINGUAL | Status: DC | PRN
Start: 2022-02-15 — End: 2022-02-16

## 2022-02-15 MED ORDER — HEPARIN (PORCINE) 25000 UT/250ML-% IV SOLN
INTRAVENOUS | Status: AC
  Filled 2022-02-15: qty 250

## 2022-02-15 MED ORDER — LIDOCAINE HCL (PF) 1 % IJ SOLN
INTRAMUSCULAR | Status: DC | PRN
  Administered 2022-02-15: 2 mL

## 2022-02-15 MED ORDER — IOHEXOL 350 MG/ML SOLN
INTRAVENOUS | Status: DC | PRN
  Administered 2022-02-15: 80 mL

## 2022-02-15 MED ORDER — SODIUM CHLORIDE 0.9 % IV SOLN: INTRAVENOUS | Status: AC

## 2022-02-15 MED ORDER — PANTOPRAZOLE SODIUM 40 MG PO TBEC
40.0000 mg | DELAYED_RELEASE_TABLET | Freq: Every day | ORAL | Status: DC
Start: 2022-02-15 — End: 2022-02-18
  Administered 2022-02-15 – 2022-02-18 (×4): 40 mg via ORAL
  Filled 2022-02-15 (×4): qty 1

## 2022-02-15 MED ORDER — SODIUM CHLORIDE 0.9 % IV SOLN: INTRAVENOUS | Status: DC

## 2022-02-15 MED ORDER — CLOPIDOGREL BISULFATE 75 MG PO TABS
75.0000 mg | ORAL_TABLET | Freq: Every day | ORAL | Status: DC
  Administered 2022-02-15 – 2022-02-18 (×4): 75 mg via ORAL
  Filled 2022-02-15 (×4): qty 1

## 2022-02-15 MED ORDER — POLYETHYL GLYC-PROPYL GLYC PF 0.4-0.3 % OP SOLN: 1.0000 [drp] | Freq: Two times a day (BID) | OPHTHALMIC | Status: DC | PRN

## 2022-02-15 MED ORDER — VERAPAMIL HCL 2.5 MG/ML IV SOLN
INTRAVENOUS | Status: AC
  Filled 2022-02-15: qty 2

## 2022-02-15 MED ORDER — HYDRALAZINE HCL 50 MG PO TABS: 50.0000 mg | ORAL_TABLET | ORAL | Status: DC

## 2022-02-15 MED ORDER — LIDOCAINE HCL (PF) 1 % IJ SOLN
INTRAMUSCULAR | Status: AC
  Filled 2022-02-15: qty 30

## 2022-02-15 MED ORDER — SODIUM CHLORIDE 0.9 % IV SOLN: 250.0000 mL | INTRAVENOUS | Status: DC | PRN

## 2022-02-15 MED ORDER — ACETAMINOPHEN 500 MG PO TABS: 500.0000 mg | ORAL_TABLET | Freq: Four times a day (QID) | ORAL | Status: DC | PRN

## 2022-02-15 MED ORDER — ADULT MULTIVITAMIN W/MINERALS CH
1.0000 | ORAL_TABLET | Freq: Every day | ORAL | Status: DC
  Administered 2022-02-16 – 2022-02-18 (×3): 1 via ORAL
  Filled 2022-02-15 (×3): qty 1

## 2022-02-15 MED ORDER — HEPARIN (PORCINE) 25000 UT/250ML-% IV SOLN
14.0000 [IU]/kg/h | INTRAVENOUS | Status: DC
  Administered 2022-02-15: 14 [IU]/kg/h via INTRAVENOUS

## 2022-02-15 MED ORDER — SODIUM CHLORIDE 0.9% FLUSH
3.0000 mL | Freq: Two times a day (BID) | INTRAVENOUS | Status: DC
  Administered 2022-02-16 – 2022-02-17 (×4): 3 mL via INTRAVENOUS

## 2022-02-15 MED ORDER — HYDRALAZINE HCL 50 MG PO TABS
100.0000 mg | ORAL_TABLET | Freq: Every day | ORAL | Status: DC
  Administered 2022-02-16 – 2022-02-18 (×3): 100 mg via ORAL
  Filled 2022-02-15 (×3): qty 2

## 2022-02-15 MED ORDER — POLYVINYL ALCOHOL 1.4 % OP SOLN
1.0000 [drp] | OPHTHALMIC | Status: DC | PRN
  Filled 2022-02-15: qty 15

## 2022-02-15 MED ORDER — SODIUM CHLORIDE 0.9% FLUSH: 3.0000 mL | INTRAVENOUS | Status: DC | PRN

## 2022-02-15 MED ORDER — HEPARIN SODIUM (PORCINE) 5000 UNIT/ML IJ SOLN
60.0000 [IU]/kg | Freq: Once | INTRAMUSCULAR | Status: AC
  Administered 2022-02-15: 3400 [IU] via INTRAVENOUS
  Filled 2022-02-15: qty 1

## 2022-02-15 MED ORDER — LABETALOL HCL 5 MG/ML IV SOLN: 10.0000 mg | INTRAVENOUS | Status: AC | PRN

## 2022-02-15 MED ORDER — HEPARIN SODIUM (PORCINE) 5000 UNIT/ML IJ SOLN
5000.0000 [IU] | Freq: Three times a day (TID) | INTRAMUSCULAR | Status: DC
  Administered 2022-02-16 – 2022-02-17 (×6): 5000 [IU] via SUBCUTANEOUS
  Filled 2022-02-15 (×7): qty 1

## 2022-02-15 MED ORDER — PRAVASTATIN SODIUM 40 MG PO TABS
80.0000 mg | ORAL_TABLET | Freq: Every day | ORAL | Status: DC
Start: 2022-02-16 — End: 2022-02-18
  Administered 2022-02-16 – 2022-02-17 (×2): 80 mg via ORAL
  Filled 2022-02-15 (×2): qty 2

## 2022-02-15 MED ORDER — POLYETHYLENE GLYCOL 3350 17 GM/SCOOP PO POWD
17.0000 g | Freq: Every day | ORAL | Status: DC
  Filled 2022-02-15: qty 255

## 2022-02-15 MED ORDER — ONDANSETRON HCL 4 MG/2ML IJ SOLN: 4.0000 mg | Freq: Four times a day (QID) | INTRAMUSCULAR | Status: DC | PRN

## 2022-02-15 MED ORDER — HYDRALAZINE HCL 20 MG/ML IJ SOLN: 10.0000 mg | INTRAMUSCULAR | Status: AC | PRN

## 2022-02-15 MED ORDER — CENTRUM SILVER 50+WOMEN PO TABS: 1.0000 | ORAL_TABLET | Freq: Every day | ORAL | Status: DC

## 2022-02-15 MED ORDER — HEPARIN SODIUM (PORCINE) 1000 UNIT/ML IJ SOLN
INTRAMUSCULAR | Status: DC | PRN
Start: 2022-02-15 — End: 2022-02-15
  Administered 2022-02-15: 8000 [IU] via INTRAVENOUS

## 2022-02-15 MED ORDER — SODIUM CHLORIDE 0.9 % WEIGHT BASED INFUSION: 1.0000 mL/kg/h | INTRAVENOUS | Status: AC

## 2022-02-15 MED ORDER — HYDRALAZINE HCL 50 MG PO TABS
50.0000 mg | ORAL_TABLET | Freq: Every day | ORAL | Status: DC
  Administered 2022-02-16 – 2022-02-17 (×2): 50 mg via ORAL
  Filled 2022-02-15 (×2): qty 1

## 2022-02-15 MED ORDER — NITROGLYCERIN 0.4 MG SL SUBL
0.4000 mg | SUBLINGUAL_TABLET | SUBLINGUAL | Status: DC | PRN
  Administered 2022-02-17: 0.4 mg via SUBLINGUAL
  Filled 2022-02-15: qty 1

## 2022-02-15 MED ORDER — ALBUTEROL SULFATE HFA 108 (90 BASE) MCG/ACT IN AERS: 2.0000 | INHALATION_SPRAY | Freq: Four times a day (QID) | RESPIRATORY_TRACT | Status: DC | PRN

## 2022-02-15 MED ORDER — HYDRALAZINE HCL 50 MG PO TABS
50.0000 mg | ORAL_TABLET | Freq: Every day | ORAL | Status: DC
  Administered 2022-02-15 – 2022-02-17 (×3): 50 mg via ORAL
  Filled 2022-02-15 (×3): qty 1

## 2022-02-15 MED ORDER — SENNA 8.6 MG PO TABS
1.0000 | ORAL_TABLET | Freq: Every day | ORAL | Status: DC
  Administered 2022-02-16 – 2022-02-18 (×3): 8.6 mg via ORAL
  Filled 2022-02-15 (×3): qty 1

## 2022-02-15 MED ORDER — TRAMADOL HCL 50 MG PO TABS: 50.0000 mg | ORAL_TABLET | Freq: Three times a day (TID) | ORAL | Status: DC | PRN

## 2022-02-15 MED ORDER — TRAMADOL HCL 50 MG PO TABS: 50.0000 mg | ORAL_TABLET | Freq: Three times a day (TID) | ORAL | 0 refills | Status: DC | PRN

## 2022-02-15 SURGICAL SUPPLY — 13 items
CATH INFINITI JR4 5F (CATHETERS) ×1 IMPLANT
CATH OPTITORQUE TIG 4.0 5F (CATHETERS) ×1 IMPLANT
CATH VISTA GUIDE 6FR XB3.5 (CATHETERS) ×1 IMPLANT
DEVICE RAD COMP TR BAND LRG (VASCULAR PRODUCTS) ×1 IMPLANT
GLIDESHEATH SLEND A-KIT 6F 22G (SHEATH) ×1 IMPLANT
GUIDEWIRE INQWIRE 1.5J.035X260 (WIRE) IMPLANT
INQWIRE 1.5J .035X260CM (WIRE) ×2
KIT ENCORE 26 ADVANTAGE (KITS) ×1 IMPLANT
KIT HEART LEFT (KITS) ×2 IMPLANT
PACK CARDIAC CATHETERIZATION (CUSTOM PROCEDURE TRAY) ×2 IMPLANT
TRANSDUCER W/STOPCOCK (MISCELLANEOUS) ×2 IMPLANT
TUBING CIL FLEX 10 FLL-RA (TUBING) ×2 IMPLANT
WIRE COUGAR XT STRL 190CM (WIRE) ×1 IMPLANT

## 2022-02-15 NOTE — Telephone Encounter (Signed)
Patient's daughter called me and spoke to me about chest pain that has been ongoing in the middle of the chest and also in the back, described as mild to moderate, also has noticed elevated blood pressure. ? ?I reviewed her chart, chest pain appears musculoskeletal, she has a moderate sized abdominal attic aneurysm, rupture discussed, does not appear to be tender abdomen. ? ?Cardiac markers in the hospital with chest pain was very flat.  Normal LVEF. ? ?Suspect musculoskeletal etiology.  I prescribed her Ultram as Tylenol did not help her.  Obviously she can certainly go to the emergency room if necessary.  Patient has an appointment to see me tomorrow. ? ?For elevated blood pressure, advised her to use sublingual nitroglycerin as needed and also have prescribed her Bystolic that she had used in the past, Rx sent. ? ?  ICD-10-CM   ?1. Essential hypertension  I10 nebivolol (BYSTOLIC) 5 MG tablet  ?  ?2. Chest pain, musculoskeletal  R07.89 traMADol (ULTRAM) 50 MG tablet  ?  ?3. Pararenal abdominal aortic aneurysm (AAA) without rupture  I71.41   ?  ? ?Meds ordered this encounter  ?Medications  ? nebivolol (BYSTOLIC) 5 MG tablet  ?  Sig: Take 1 tablet (5 mg total) by mouth daily as needed (For BP >150/80 mm Hg).  ?  Dispense:  50 tablet  ?  Refill:  3  ? traMADol (ULTRAM) 50 MG tablet  ?  Sig: Take 1 tablet (50 mg total) by mouth 3 (three) times daily as needed.  ?  Dispense:  30 tablet  ?  Refill:  0  ?  ?

## 2022-02-15 NOTE — ED Notes (Signed)
Vital signs stable. 

## 2022-02-15 NOTE — ED Provider Notes (Addendum)
[facility] EMERGENCY DEPARTMENT Provider Note   CSN: [id number] Arrival date & time: 02/15/22  1307     History  Chief Complaint  Patient presents with   Chest Pain    Gina Bond is a 88 y.o. female.  Patient onset of left anterior chest pain yesterday morning.  It was constant.  This morning started having pain in the left arm.  Pain has been constant.  Patient with recent admission February 28 discharged March 1 for a rule out.  Had a echocardiogram done at that time with good LV function.  Patient known to have an abdominal aneurysm.  In a place that is difficult to fix.  Patient not having any abdominal pain or any pain radiating to the back area here today.  Patient brought in by her daughter past medical history is significant for hypertension COPD hyperlipidemia the abdominal aortic aneurysm coronary artery disease renal insufficiency.  Patient is not on any blood thinners.  Patient did not take any aspirin today.  Patient did take her normal blood pressure medicines and other medicines just prior to arrival.  Patient has nitroglycerin but she did not use it and does not want to use it.      Home Medications Prior to Admission medications   Medication Sig Start Date End Date Taking? Authorizing Provider  acetaminophen (TYLENOL) 500 MG tablet Take 500 mg by mouth every 6 (six) hours as needed (pain).    [provider]  albuterol (VENTOLIN HFA) 108 (90 Base) MCG/ACT inhaler Inhale 2 puffs into the lungs every 6 (six) hours as needed for wheezing or shortness of breath.    [provider]  aspirin EC 81 MG EC tablet Take 1 tablet (81 mg total) by mouth daily. Swallow whole. 02/03/22   Lynn Ito, MD  cholecalciferol (VITAMIN D3) 25 MCG (1000 UNIT) tablet Take 1,000 Units by mouth daily with supper.    [provider]  fenofibrate micronized (LOFIBRA) 134 MG capsule Take 134 mg by mouth daily after supper. [place] 05/06/21   [provider]  furosemide (LASIX) 20 MG tablet Take 1 tablet (20 mg total) by mouth every other day. Patient taking differently: Take 20 mg by mouth every other day. [place] 04/23/21   Yates Decamp, MD  hydrALAZINE (APRESOLINE) 50 MG tablet Take 50-100 mg by mouth See admin instructions. Take 2 tablets (100 mg) by mouth with breakfast, take 1 tablet (50 mg) with lunch and at bedtime ([facility])    [provider]  isosorbide mononitrate (IMDUR) 120 MG 24 hr tablet Take 1 tablet (120 mg total) by mouth every evening. 02/04/22   Yates Decamp, MD  Multiple Vitamins-Minerals (CENTRUM SILVER 50+WOMEN) TABS Take 1 tablet by mouth daily with breakfast.    [provider]  nebivolol (BYSTOLIC) 5 MG tablet Take 1 tablet (5 mg total) by mouth daily as needed (For BP >150/80 mm Hg). 02/15/22   Yates Decamp, MD  nitroGLYCERIN (NITROSTAT) 0.4 MG SL tablet Place 1 tablet (0.4 mg total) under the tongue every 5 (five) minutes as needed for chest pain. 01/19/21   Glade Lloyd, MD  omeprazole (PRILOSEC) 40 MG capsule Take 40 mg by mouth daily with breakfast. Zydus 01/02/15   [provider]  polyethylene glycol powder (GLYCOLAX/MIRALAX) 17 GM/SCOOP powder Take 17 g by mouth at bedtime. Mix with juice    [provider]  pravastatin (PRAVACHOL) 20 MG tablet TAKE 2 TABLETS EVERY       EVENING  AFTER DINNER. Patient taking differently: Take 40 mg by mouth daily after supper. [place] 04/12/21   Yates Decamp, MD  QUEtiapine (SEROQUEL) 100 MG tablet Take 1 tablet (100 mg total) by mouth at bedtime for 14 days. For sleep and delirium. Patient not taking: Reported on 02/02/2022 08/24/21 09/07/21  Darlin Priestly, MD  senna (SENOKOT) 8.6 MG tablet Take 1 tablet by mouth daily with breakfast.    [provider]  SYSTANE ULTRA PF 0.4-0.3 % SOLN Place 1 drop into both eyes 2 (two) times daily as needed (dry eyes).    [provider]  traMADol (ULTRAM) 50 MG tablet Take 1 tablet (50 mg total) by  mouth 3 (three) times daily as needed. 02/15/22   Yates Decamp, MD  verapamil (CALAN-SR) 240 MG CR tablet Take 240 mg by mouth 2 (two) times daily after a meal. [place] 12/23/20   [provider]      Allergies    Benicar [olmesartan], Fenofibrate, Other, Sulfa antibiotics, Sulfacetamide sodium, Atorvastatin, Isosorbide dinitrate, Magnesium, Ondansetron, Rosuvastatin calcium, Spironolactone, Statins, Sulfamethoxazole, and Tobramycin    Review of Systems   Review of Systems  Constitutional:  Negative for chills and fever.  HENT:  Negative for rhinorrhea and sore throat.   Eyes:  Negative for visual disturbance.  Respiratory:  Negative for cough and shortness of breath.   Cardiovascular:  Positive for chest pain. Negative for leg swelling.  Gastrointestinal:  Negative for abdominal pain, diarrhea, nausea and vomiting.  Genitourinary:  Negative for dysuria.  Musculoskeletal:  Negative for back pain and neck pain.  Skin:  Negative for rash.  Neurological:  Negative for dizziness, light-headedness and headaches.  Hematological:  Does not bruise/bleed easily.  Psychiatric/Behavioral:  Negative for confusion.    Physical Exam Updated Vital Signs BP (!) 167/84   Pulse 82   Temp 98 F (36.7 C) (Oral)   Resp 17   Ht 1.702 m (5\' 7" )   Wt 56.4 kg   SpO2 95%   BMI 19.47 kg/m  Physical Exam Vitals and nursing note reviewed.  Constitutional:      General: She is not in acute distress.    Appearance: Normal appearance. She is well-developed.  HENT:     Head: Normocephalic and atraumatic.  Eyes:     Extraocular Movements: Extraocular movements intact.     Conjunctiva/sclera: Conjunctivae normal.     Pupils: Pupils are equal, round, and reactive to light.  Cardiovascular:     Rate and Rhythm: Normal rate and regular rhythm.     Heart sounds: No murmur heard. Pulmonary:     Effort: Pulmonary effort is normal. No respiratory distress.     Breath sounds: Normal breath sounds.   Chest:     Chest wall: No tenderness.  Abdominal:     Palpations: Abdomen is soft.     Tenderness: There is no abdominal tenderness.  Musculoskeletal:        General: No swelling.     Cervical back: Normal range of motion and neck supple.  Skin:    General: Skin is warm and dry.     Capillary Refill: Capillary refill takes less than 2 seconds.  Neurological:     General: No focal deficit present.     Mental Status: She is alert and oriented to person, place, and time.     Cranial Nerves: No cranial nerve deficit.     Sensory: No sensory deficit.     Motor: No weakness.  Psychiatric:  Mood and Affect: Mood normal.    ED Results / Procedures / Treatments   Labs (all labs ordered are listed, but only abnormal results are displayed) Labs Reviewed  BASIC METABOLIC PANEL - Abnormal; Notable for the following components:      Result Value   Glucose, Bld 128 (*)    Creatinine, Ser 1.27 (*)    GFR, Estimated 41 (*)    All other components within normal limits  CBC - Abnormal; Notable for the following components:   HCT 46.2 (*)    All other components within normal limits  TROPONIN I (HIGH SENSITIVITY) - Abnormal; Notable for the following components:   Troponin I (High Sensitivity) 55 (*)    All other components within normal limits  RESP PANEL BY RT-PCR (FLU A&B, COVID) ARPGX2  HEMOGLOBIN A1C  CBC WITH DIFFERENTIAL/PLATELET  PROTIME-INR  APTT  COMPREHENSIVE METABOLIC PANEL  LIPID PANEL  TROPONIN I (HIGH SENSITIVITY)    EKG EKG Interpretation  Date/Time:  Tuesday February 15 2022 13:16:49 EDT Ventricular Rate:  76 PR Interval:  176 QRS Duration: 96 QT Interval:  398 QTC Calculation: 447 R Axis:   -60 Text Interpretation: Normal sinus rhythm Left anterior fascicular block Cannot rule out Inferior infarct , age undetermined Anterior infarct , age undetermined Abnormal ECG When compared with ECG of 02-Feb-2022 12:50, PREVIOUS ECG IS PRESENT No significant change  since last tracing Confirmed by Vanetta Mulders [id number]) on 02/15/2022 3:15:24 PM  Radiology DG Chest 2 View  Result Date: 02/15/2022 CLINICAL DATA:  Chest pain EXAM: CHEST - 2 VIEW COMPARISON:  Radiograph 08/17/2021 FINDINGS: Unchanged cardiomediastinal silhouette. There is no focal airspace consolidation. There is no large pleural effusion. There is no visible pneumothorax. There is no acute osseous abnormality. Thoracic spondylosis. IMPRESSION: No evidence of acute cardiopulmonary disease. Electronically Signed   By: Caprice Renshaw M.D.   On: 02/15/2022 14:29    Procedures Procedures    Medications Ordered in ED Medications  0.9 %  sodium chloride infusion ( Intravenous New Bag/Given 02/15/22 1725)  heparin ADULT infusion 100 units/mL (25000 units/228mL) (14 Units/kg/hr  56.4 kg Intravenous New Bag/Given 02/15/22 1727)  morphine (PF) 2 MG/ML injection 1 mg (1 mg Intravenous Given 02/15/22 1721)  aspirin chewable tablet 324 mg (324 mg Oral Given 02/15/22 1725)  heparin injection 3,400 Units (3,400 Units Intravenous Given 02/15/22 1729)    ED Course/ Medical Decision Making/ A&P                           Medical Decision Making Amount and/or Complexity of Data Reviewed Labs: ordered. Radiology: ordered.  Risk OTC drugs. Prescription drug management.   CRITICAL CARE Performed by: Vanetta Mulders Total critical care time: 45 minutes Critical care time was exclusive of separately billable procedures and treating other patients. Critical care was necessary to treat or prevent imminent or life-threatening deterioration. Critical care was time spent personally by me on the following activities: development of treatment plan with patient and/or surrogate as well as nursing, discussions with consultants, evaluation of patient's response to treatment, examination of patient, obtaining history from patient or surrogate, ordering and performing treatments and interventions, ordering and review of  laboratory studies, ordering and review of radiographic studies, pulse oximetry and re-evaluation of patient's condition.  Patient's pain started yesterday morning.  Initial troponin was reassuring with a being at a 12.  This morning she developed left arm pain.  We were waiting on a delta troponin.  When she got very severe pain that radiated very severely to the left arm and into the left side of the neck.  Repeated EKG.  That EKG consistent with acute STEMI anterior MI.  At that point the second troponin did come back this would have been prior to the severe pain but had raised to 55.  Patient is initial EKG without any significant changes from prior.  Patient's labs here today GFR 41 no significant changes in her labs.  We did mention that she went from 12 on the troponin to 55 prior to the severe pain.  And chest x-ray had no acute findings.  Code STEMI initiated.  Patient given aspirin patient started on heparin.  Contacted Dr. Jacinto Halim on-call for STEMI's for the group that she is followed by.  [facility] was here for another patient.  Got [facility] to switch over to be taking her in.  Dr. Jacinto Halim will meet her in the Cath Lab at [facility].   Final Clinical Impression(s) / ED Diagnoses Final diagnoses:  ST elevation myocardial infarction (STEMI), unspecified artery [facility])    Rx / DC Orders ED Discharge Orders     None         Vanetta Mulders, MD 02/15/22 1739    Vanetta Mulders, MD 02/15/22 1740

## 2022-02-15 NOTE — ED Notes (Addendum)
Pt. Is in no distress and has no reports of dizziness when she gets up to walk to the restroom ?
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
## 2022-02-15 NOTE — ED Notes (Signed)
ED Provider at bedside. 

## 2022-02-15 NOTE — ED Notes (Signed)
Pt. C/o pain in the L arm and L neck and L side chest. ?

## 2022-02-15 NOTE — ED Triage Notes (Signed)
Chest pain through the back and down the left arm that started yesterday ?Denies SOB  ? ?Was seen and admitted 2 weeks ago for same  ?Spoke with cardiology and told to come to ER  ?

## 2022-02-16 ENCOUNTER — Encounter (HOSPITAL_COMMUNITY): Payer: Self-pay | Admitting: Cardiology

## 2022-02-16 ENCOUNTER — Ambulatory Visit: Payer: Medicare Other | Admitting: Student

## 2022-02-16 ENCOUNTER — Telehealth: Payer: Self-pay

## 2022-02-16 ENCOUNTER — Other Ambulatory Visit: Payer: Self-pay

## 2022-02-16 ENCOUNTER — Inpatient Hospital Stay (HOSPITAL_COMMUNITY): Payer: Medicare Other

## 2022-02-16 DIAGNOSIS — I7143 Infrarenal abdominal aortic aneurysm, without rupture: Secondary | ICD-10-CM

## 2022-02-16 DIAGNOSIS — I10 Essential (primary) hypertension: Secondary | ICD-10-CM

## 2022-02-16 DIAGNOSIS — I2510 Atherosclerotic heart disease of native coronary artery without angina pectoris: Secondary | ICD-10-CM | POA: Diagnosis not present

## 2022-02-16 LAB — ECHOCARDIOGRAM LIMITED
AV Mean grad: 8 mmHg
AV Peak grad: 15.1 mmHg
Ao pk vel: 1.94 m/s
Area-P 1/2: 2.59 cm2
Height: 67 in
MV M vel: 2.91 m/s
MV Peak grad: 33.9 mmHg
P 1/2 time: 979 msec
Single Plane A4C EF: 82.5 %
Weight: 5167.58 oz

## 2022-02-16 LAB — TROPONIN I (HIGH SENSITIVITY)
Troponin I (High Sensitivity): 18307 ng/L (ref <18)
Troponin I (High Sensitivity): 21518 ng/L (ref <18)
Troponin I (High Sensitivity): 10289 ng/L (ref <18)

## 2022-02-16 LAB — LIPID PANEL
Cholesterol: 175 mg/dL (ref 0–200)
HDL: 53 mg/dL (ref >40)
Total CHOL/HDL Ratio: 3.3 RATIO
Triglycerides: 126 mg/dL (ref <150)
VLDL: 25 mg/dL (ref 0–40)

## 2022-02-16 MED ORDER — SODIUM CHLORIDE 0.9 % IV SOLN: INTRAVENOUS | Status: DC

## 2022-02-16 MED ORDER — NITROGLYCERIN 0.4 MG SL SUBL: 0.4000 mg | SUBLINGUAL_TABLET | SUBLINGUAL | Status: DC | PRN

## 2022-02-16 MED ORDER — NITROGLYCERIN 0.4 MG SL SUBL
SUBLINGUAL_TABLET | SUBLINGUAL | Status: AC
  Administered 2022-02-16: 0.4 mg via SUBLINGUAL
  Filled 2022-02-16: qty 1

## 2022-02-16 MED FILL — Verapamil HCl IV Soln 2.5 MG/ML: INTRAVENOUS | Qty: 2 | Status: AC
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
## 2022-02-16 NOTE — Telephone Encounter (Signed)
Daughter called on-call and left a message about patient having chest and arm pain. Patient is already in the hospital, and has already seen Dr. Einar Gip. ?

## 2022-02-16 NOTE — TOC Initial Note (Signed)
Transition of Care (TOC) - Initial/Assessment Note  ? ? ?Patient Details  ?Name: Gina Bond ?MRN: [id number] ?Date of Birth: 05-31-1932 ? ?Transition of Care (TOC) CM/SW Contact:    ?Pollie Friar, RN ?Phone Number: ?02/16/2022, 1:35 PM ? ?Clinical Narrative:                 ?Patient is from home alone but her daughter can stay with her a few days as needed. She is currently active with [facility] for PT and wants to continue with their services. CM has updated Levada Dy with [facility] of her admission. PT will need new Home health PT orders prior to discharge home. ?Pt has needed DME at home.  ?Pt denies any issues with home medications or transportation. Her daughter takes her to her appointments.  ?TOC following. ? ?Expected Discharge Plan: [place] ?Barriers to Discharge: Continued Medical Work up ? ? ?Patient Goals and CMS Choice ?  ?CMS Medicare.gov Compare Post Acute Care list provided to:: Patient ?Choice offered to / list presented to : Patient ? ?Expected Discharge Plan and Services ?Expected Discharge Plan: [place] ?  ?Discharge Planning Services: CM Consult ?Post Acute Care Choice: Home Health ?Living arrangements for the past 2 months: [place] ?                ?  ?  ?  ?  ?  ?HH Arranged: PT ?[place] Agency: [facility] [facility] ?Date HH Agency Contacted: 02/16/22 ?  ?Representative spoke with at [facility] [place]: Levada Dy ? ?Prior Living Arrangements/Services ?Living arrangements for the past 2 months: [place] ?Lives with:: Self ?Patient language and need for interpreter reviewed:: Yes ?Do you feel safe going back to the place where you live?: Yes      ?Need for Family Participation in Patient Care: Yes (Comment) ?Care giver support system in place?: Yes (comment) ?Current home services:  (cane and walker) ?Criminal Activity/Legal Involvement Pertinent to Current Situation/Hospitalization: No - Comment as needed ? ?Activities of Daily  Living ?  ?  ? ?Permission Sought/Granted ?  ?  ?   ?   ?   ?   ? ?Emotional Assessment ?Appearance:: Appears stated age ?Attitude/Demeanor/Rapport: Engaged ?Affect (typically observed): Accepting ?Orientation: : Oriented to Self, Oriented to Place, Oriented to  Time, Oriented to Situation ?  ?Psych Involvement: No (comment) ? ?Admission diagnosis:  ST elevation myocardial infarction (STEMI), unspecified artery ([place]) [I21.3] ?Unstable angina pectoris ([place]) [I20.0] ?Patient Active Problem List  ? Diagnosis Date Noted  ? Acute ST elevation myocardial infarction (STEMI) of anteroseptal wall ([place]) 02/15/2022  ? Unstable angina pectoris ([place]) 02/15/2022  ? GERD (gastroesophageal reflux disease) 02/02/2022  ? Depression 02/02/2022  ? Chest pain, rule out acute myocardial infarction 02/01/2022  ? Fall at home, initial encounter 08/17/2021  ? Pulmonary hypertension with chronic cor pulmonale ([place]) 08/17/2021  ? Closed fracture of anatomical neck of humerus, right, initial encounter 08/17/2021  ? Unspecified fracture of the lower end of left radius, initial encounter for closed fracture 08/17/2021  ? Closed fracture of left tibial plateau 08/17/2021  ? Closed fracture of medial epicondyle of right humerus 08/17/2021  ? Basal cell carcinoma (BCC) of skin of nose 07/07/2020  ? Facial swelling 07/07/2020  ? Abdominal aortic aneurysm 09/04/2018  ? Chronic constipation 06/15/2017  ? Diarrhea 03/17/2017  ? Right hip pain 05/26/2016  ? Hypoxia   ? Enteritis 12/06/2015  ? Dehydration 12/06/2015  ?  Coronary artery disease involving native coronary artery of native heart without angina pectoris 12/06/2015  ? SBO (small bowel obstruction) ([place])   ? Renal failure (ARF), acute on chronic (HCC)   ? Back pain   ? Dyspnea   ? Small bowel obstruction ([place])   ? Partial small bowel obstruction ([place]) 07/24/2015  ? Ileitis 07/24/2015  ? Chronic kidney disease, stage 3b ([place]) 07/24/2015  ? Breast pain, left 04/13/2015  ? Essential hypertension  12/10/2014  ? COPD exacerbation ([place]) 12/10/2014  ? Unstable angina ([place]) 12/09/2014  ? Cramp of both lower extremities 08/18/2014  ? Mixed hyperlipidemia 03/10/2014  ? Pain in joint, pelvic region and thigh 03/10/2014  ? COPD (chronic obstructive pulmonary disease) ([place]) 09/13/2013  ? Osteoarthrosis 08/09/2013  ? Disorder of kidney and ureter 07/16/2013  ? Thoracic or lumbosacral neuritis or radiculitis 07/16/2013  ? ?PCP:  [name], MD ?Pharmacy:   ?[facility] [place] [facility] ?[address] [facility] ?[place] [address] ?Phone: [phone] Fax: [phone] ? ?[id number] [address], [address] AT [facility] OF [address] ?[place] ?[place] [address] ?Phone: [phone] Fax: [phone] ? ? ? ? ?Social Determinants of Health (SDOH) Interventions ?  ? ?Readmission Risk Interventions ?Readmission Risk Prevention Plan 08/20/2021  ?Transportation Screening Complete  ?PCP or Specialist Appt within 5-7 Days Complete  ?Home Care Screening Complete  ?Medication Review (RN CM) Complete  ?Some recent data might be hidden  ? ? ? ?

## 2022-02-16 NOTE — Progress Notes (Signed)
? ?  Echocardiogram ?2D Echocardiogram has been performed. ? ?Gina Bond ?02/16/2022, 1:41 PM ?

## 2022-02-16 NOTE — Progress Notes (Signed)
Discussed with pt and daughter MI, restrictions, and CRPII. Pt receptive. Daughter concerned for falls however pt did well with PT this am. Pt is a "go getter" and likes to be active. Will continue to discuss exercise and this balance. Will refer to [place]. Pt walked this am, will try to f/u this pm. ?[id number] ?Yves Dill CES, ACSM ?1:30 PM ?02/16/2022 ? ?

## 2022-02-16 NOTE — Progress Notes (Signed)
Subjective:  ?Patient is chest pain-free.  Daughter is present at the bedside.  States that her breathing is also better. ? ?Intake/Output from previous day: ? ?I/O last 3 completed shifts: ?In: 1262.6 [P.O.:840; I.V.:422.6] ?Out: 100 [Urine:100] ?No intake/output data recorded. ? ?Blood pressure 112/61, pulse 71, temperature 99.5 ?F (37.5 ?C), temperature source Oral, resp. rate 18, height _0  (1.702 m), weight (!) 146.5 kg, SpO2 94 %. Body mass index is 50.58 kg/m?.  ? ?Vitals with BMI 02/16/2022 02/16/2022 02/15/2022  ?Height - - -  ?Weight - - -  ?BMI - - -  ?Systolic 956 387 564  ?Diastolic 61 66 77  ?Pulse 71 72 -  ? ? ?Physical Exam ?Neck:  ?   Vascular: No JVD.  ?Cardiovascular:  ?   Rate and Rhythm: Normal rate and regular rhythm.  ?   Pulses: Intact distal pulses.     ?     Carotid pulses are 2+ on the right side and 2+ on the left side. ?     Popliteal pulses are 2+ on the right side and 2+ on the left side.  ?     Dorsalis pedis pulses are 1+ on the right side and 0 on the left side.  ?     Posterior tibial pulses are 1+ on the right side and 0 on the left side.  ?   Heart sounds: Normal heart sounds. No murmur heard. ?  No gallop.  ?Pulmonary:  ?   Effort: Pulmonary effort is normal.  ?   Breath sounds: Normal breath sounds.  ?Abdominal:  ?   General: Bowel sounds are normal.  ?   Palpations: Abdomen is soft.  ?Musculoskeletal:  ?   Right lower leg: No edema.  ?   Left lower leg: No edema.  ? ? ?Lab Results: ? ?BNP (last 3 results) ?No results for input(s): BNP in the last 8760 hours. ? ?ProBNP (last 3 results) ?No results for input(s): PROBNP in the last 8760 hours. ?BMP Latest Ref Rng & Units 02/15/2022 02/15/2022 02/15/2022  ?Glucose 70 - 99 mg/dL - 109(H) 128(H)  ?BUN 8 - 23 mg/dL - 15 16  ?Creatinine 0.44 - 1.00 mg/dL 1.27(H) 1.27(H) 1.27(H)  ?BUN/Creat Ratio 12 - 28 - - -  ?Sodium 135 - 145 mmol/L - 139 139  ?Potassium 3.5 - 5.1 mmol/L - 3.7 3.5  ?Chloride 98 - 111 mmol/L - 100 100  ?CO2 22 - 32  mmol/L - 28 29  ?Calcium 8.9 - 10.3 mg/dL - 9.7 9.9  ? ?Hepatic Function Latest Ref Rng & Units 02/15/2022 02/01/2022 01/16/2021  ?Total Protein 6.5 - 8.1 g/dL 6.9 7.6 7.1  ?Albumin 3.5 - 5.0 g/dL 4.0 4.5 4.6  ?AST 15 - 41 U/L 24 29 45(H)  ?ALT 0 - 44 U/L 16 18 36  ?Alk Phosphatase 38 - 126 U/L 34(L) 41 37(L)  ?Total Bilirubin 0.3 - 1.2 mg/dL 0.6 0.8 1.2  ? ?CBC Latest Ref Rng & Units 02/15/2022 02/15/2022 02/15/2022  ?WBC 4.0 - 10.5 K/uL 6.0 5.8 5.8  ?Hemoglobin 12.0 - 15.0 g/dL 13.0 14.3 15.0  ?Hematocrit 36.0 - 46.0 % 40.3 43.9 46.2(H)  ?Platelets 150 - 400 K/uL 287 310 317  ? ?Lab Results  ?Component Value Date  ? CHOL 175 02/15/2022  ? HDL 53 02/15/2022  ? [place] NOT CALCULATED 02/15/2022  ? TRIG 126 02/15/2022  ? CHOLHDL 3.3 02/15/2022  ?  ? ?Cardiac Panel (last 3 results) ?Recent Labs  ?  02/15/22 ?1555 02/15/22 ?2240 02/16/22 ?0206  ?TROPONINIHS 55* [id number]* 18,307*  ?  ?HEMOGLOBIN A1C ?No results found for: HGBA1C, MPG ?TSH ?No results for input(s): TSH in the last 8760 hours. ? ? cholecalciferol  1,000 Units Oral Q supper  ? clopidogrel  75 mg Oral Daily  ? fenofibrate  54 mg Oral Daily  ? heparin  5,000 Units Subcutaneous Q8H  ? hydrALAZINE  100 mg Oral Daily  ? hydrALAZINE  50 mg Oral Q1200  ? hydrALAZINE  50 mg Oral QHS  ? isosorbide mononitrate  120 mg Oral QPM  ? multivitamin with minerals  1 tablet Oral Daily  ? nebivolol  10 mg Oral Daily  ? pantoprazole  40 mg Oral Daily  ? polyethylene glycol  17 g Oral QHS  ? pravastatin  80 mg Oral q1800  ? senna  1 tablet Oral Q breakfast  ? sodium chloride flush  3 mL Intravenous Q12H  ? verapamil  240 mg Oral BID PC  ?  ?Radiology:  ? ?DG Chest 2 View 02/15/2022: ?CLINICAL DATA:  Chest pain EXAM: CHEST - 2 VIEW COMPARISON:  Radiograph 08/17/2021 FINDINGS: Unchanged cardiomediastinal silhouette. There is no focal airspace consolidation. There is no large pleural effusion. There is no visible pneumothorax. There is no acute osseous abnormality. Thoracic spondylosis.  IMPRESSION: No evidence of acute cardiopulmonary disease. ?  ?CTA thorax and abdomen 02/01/2022: ?1. Stable to minimally increased in size 5.1 x 5.1 cm (from 5.1 x 4.9 cm) suprarenal abdominal aorta aneurysm. Recommend follow-up CT/MR every 6 months and vascular consultation. This recommendation follows ACR consensus guidelines: White Paper of the ACR Incidental Findings Committee II on Vascular Findings. J Am Coll Radiol 2013; ?56:433-295. Aortic aneurysm NOS (ICD10-I71.9). ?2. Aortic Atherosclerosis (ICD10-I70.0) and Emphysema (ICD10-J43.9). ?3. No pulmonary embolus. ?4. Right lower pulmonary micronodule. No follow-up needed if patient is low-risk.This recommendation follows the consensus statement: Guidelines for Management of Incidental Pulmonary Nodules Detected on CT Images: From the Fleischner Society 2017; Radiology 2017; ?284:228-243. ?5. Diffuse colonic diverticulosis with no acute diverticulitis. ?6. Atrophic right kidney. ?  ?Cardiac Studies:  ?  ?Echocardiogram 02/02/2022:    ? 1. Left ventricular ejection fraction, by estimation, is 60 to 65%. The left ventricle has normal function. The left ventricle has no regional wall motion abnormalities. Left ventricular diastolic parameters are consistent with Grade II diastolic  ?dysfunction (pseudonormalization). Elevated left atrial pressure. ? 2. Right ventricular systolic function is normal. The right ventricular size is mildly enlarged. There is mildly elevated pulmonary artery systolic pressure. ? 3. Right atrial size was mildly dilated. ? 4. The mitral valve is grossly normal. Mild mitral valve regurgitation. No evidence of mitral stenosis. ? 5. The aortic valve is tricuspid. Aortic valve regurgitation is mild. Aortic valve sclerosis is present, with no evidence of aortic valve stenosis. ? 6. The inferior vena cava is dilated in size with >50% respiratory variability, suggesting right atrial pressure of 8 mmHg. ? ?Left Heart Catheterization 02/15/22:   ?LV: 179/2, EDP 10 mmHg.  Ao to hold an 8/96, mean 145 mmHg.  No pressure gradient across the aortic valve.  Normal LV systolic function, EF 55 to 60%.  No wall motion abnormality. ?LM: Large vessel, mild calcification evident. ?CX: Large vessel.  Gives origin to a very large OM1 which has mild disease and continues in the AV groove. ?LAD: Large vessel giving rise to moderate-sized D1 and several small diagonals.  Distal LAD has a focal what appears like an ulcerated 90% stenosis.  LAD wraps around the apex. ?RCA: Large vessel, mild diffuse luminal irregularity.  Large PDA that arises in the mid RCA and also in the distal RCA as PDA 1 and PDA to and a large PL branch.  Minimal disease. ? ?Intervention: I felt that the distal LAD stenosis appeared to be hazy and ulcerated, felt a plaque rupture could be possible, I passed a cougar guidewire with complete resolution of the stenosis.  I pulled the wire back and repeated angiography, there is very minimal lesion and no haziness.  I felt that either the lesion had spasm that was relieved with radial arterial 200 mcg nitroglycerin had given earlier versus a plaque rupture that healed up with the passage of the guidewire. ? ?No intervention was performed.  Guide catheter disengaged and pulled out of the body over the J-wire.  There were no immediate complication. ?  ?EKG:  ? ?EKG 02/16/2022: Normal sinus rhythm at rate of 67 bpm, left axis deviation, left anterior fascicular block.  Anterior infarct age undetermined, ST elevation in V1 and V2 suggest septal acute infarct/subacute infarct.  Compared to 02/15/2022, ST elevation less prominent. ? ?EKG 02/15/2022 at 1714 hrs.: Normal sinus rhythm with rate of 79 bpm, left axis deviation, left anterior fascicular block.  ST elevation in V1 to V3 suggest anteroseptal infarct, acute.  Nonspecific T abnormality.  Compared to EKG at 1317, anterior ST elevation new ?  ?  ?Assessment  ?  ?Gina Bond is a 88 y.o. Caucasian   female  with COPD, prior tobacco use disorder, moderate sized 4.5 cm abdominal aortic aneurysm, mixed hyperlipidemia, stage III chronic kidney disease and solitary kidney.  ?  ?1.  Acute anteroseptal STEMI due t

## 2022-02-17 ENCOUNTER — Inpatient Hospital Stay (HOSPITAL_COMMUNITY)
Admission: EM | Disposition: A | Discharge status: Home Health | Payer: Self-pay | Source: Home / Self Care | Attending: Cardiology

## 2022-02-17 ENCOUNTER — Other Ambulatory Visit: Payer: Self-pay

## 2022-02-17 ENCOUNTER — Encounter: Payer: Self-pay | Admitting: Cardiology

## 2022-02-17 DIAGNOSIS — I214 Non-ST elevation (NSTEMI) myocardial infarction: Secondary | ICD-10-CM

## 2022-02-17 HISTORY — PX: CORONARY ANGIOGRAPHY: CATH118303

## 2022-02-17 LAB — HEMOGLOBIN A1C
Hgb A1c MFr Bld: 5.2 % (ref 4.8–5.6)
Mean Plasma Glucose: 103 mg/dL

## 2022-02-17 SURGERY — CORONARY ANGIOGRAPHY (CATH LAB)
Anesthesia: LOCAL

## 2022-02-17 MED ORDER — CLOPIDOGREL BISULFATE 75 MG PO TABS: 75.0000 mg | ORAL_TABLET | Freq: Every day | ORAL | 1 refills | Status: DC

## 2022-02-17 MED ORDER — NEBIVOLOL HCL 10 MG PO TABS
10.0000 mg | ORAL_TABLET | Freq: Every day | ORAL | 1 refills | Status: DC
Start: 2022-02-18 — End: 2022-11-25

## 2022-02-17 MED ORDER — PRAVASTATIN SODIUM 80 MG PO TABS: 80.0000 mg | ORAL_TABLET | Freq: Every day | ORAL | 1 refills | Status: DC

## 2022-02-17 MED ORDER — LIDOCAINE HCL (PF) 1 % IJ SOLN
INTRAMUSCULAR | Status: AC
Start: 2022-02-17 — End: ?
  Filled 2022-02-17: qty 30

## 2022-02-17 MED ORDER — SODIUM CHLORIDE 0.9% FLUSH: 3.0000 mL | INTRAVENOUS | Status: DC | PRN

## 2022-02-17 MED ORDER — NITROGLYCERIN 1 MG/10 ML FOR IR/CATH LAB
INTRA_ARTERIAL | Status: AC
  Filled 2022-02-17: qty 10

## 2022-02-17 MED ORDER — HEPARIN SODIUM (PORCINE) 1000 UNIT/ML IJ SOLN
INTRAMUSCULAR | Status: DC | PRN
  Administered 2022-02-17: 5000 [IU] via INTRAVENOUS

## 2022-02-17 MED ORDER — FENTANYL CITRATE (PF) 100 MCG/2ML IJ SOLN
INTRAMUSCULAR | Status: DC | PRN
  Administered 2022-02-17: 25 ug via INTRAVENOUS

## 2022-02-17 MED ORDER — VERAPAMIL HCL 2.5 MG/ML IV SOLN
INTRAVENOUS | Status: AC
  Filled 2022-02-17: qty 2

## 2022-02-17 MED ORDER — FENTANYL CITRATE PF 50 MCG/ML IJ SOSY: 12.5000 ug | PREFILLED_SYRINGE | Freq: Four times a day (QID) | INTRAMUSCULAR | Status: DC | PRN

## 2022-02-17 MED ORDER — PANTOPRAZOLE SODIUM 40 MG PO TBEC: 40.0000 mg | DELAYED_RELEASE_TABLET | Freq: Every day | ORAL | 2 refills | Status: DC

## 2022-02-17 MED ORDER — HEPARIN (PORCINE) IN NACL 1000-0.9 UT/500ML-% IV SOLN
INTRAVENOUS | Status: DC | PRN
  Administered 2022-02-17 (×2): 500 mL

## 2022-02-17 MED ORDER — VERAPAMIL HCL 2.5 MG/ML IV SOLN
INTRAVENOUS | Status: DC | PRN
  Administered 2022-02-17: 10 mL via INTRA_ARTERIAL

## 2022-02-17 MED ORDER — ASPIRIN 81 MG PO CHEW: 81.0000 mg | CHEWABLE_TABLET | ORAL | Status: DC

## 2022-02-17 MED ORDER — SODIUM CHLORIDE 0.9% FLUSH
3.0000 mL | Freq: Two times a day (BID) | INTRAVENOUS | Status: DC
  Administered 2022-02-18: 3 mL via INTRAVENOUS

## 2022-02-17 MED ORDER — SODIUM CHLORIDE 0.9% FLUSH
3.0000 mL | Freq: Two times a day (BID) | INTRAVENOUS | Status: DC
  Administered 2022-02-17 – 2022-02-18 (×2): 3 mL via INTRAVENOUS

## 2022-02-17 MED ORDER — HEPARIN SODIUM (PORCINE) 1000 UNIT/ML IJ SOLN
INTRAMUSCULAR | Status: AC
  Filled 2022-02-17: qty 10

## 2022-02-17 MED ORDER — SODIUM CHLORIDE 0.9 % WEIGHT BASED INFUSION: 3.0000 mL/kg/h | INTRAVENOUS | Status: DC

## 2022-02-17 MED ORDER — FENTANYL CITRATE (PF) 100 MCG/2ML IJ SOLN
INTRAMUSCULAR | Status: AC
  Filled 2022-02-17: qty 2

## 2022-02-17 MED ORDER — SODIUM CHLORIDE 0.9 % IV SOLN: 250.0000 mL | INTRAVENOUS | Status: DC | PRN

## 2022-02-17 MED ORDER — NEBIVOLOL HCL 5 MG PO TABS
5.0000 mg | ORAL_TABLET | Freq: Every day | ORAL | Status: DC
  Administered 2022-02-18: 5 mg via ORAL
  Filled 2022-02-17: qty 1

## 2022-02-17 MED ORDER — SODIUM CHLORIDE 0.9 % WEIGHT BASED INFUSION
1.0000 mL/kg/h | INTRAVENOUS | Status: AC
  Administered 2022-02-17: 1 mL/kg/h via INTRAVENOUS

## 2022-02-17 MED ORDER — SODIUM CHLORIDE 0.9 % WEIGHT BASED INFUSION: 1.0000 mL/kg/h | INTRAVENOUS | Status: DC

## 2022-02-17 MED ORDER — IOHEXOL 350 MG/ML SOLN
INTRAVENOUS | Status: DC | PRN
  Administered 2022-02-17: 25 mL

## 2022-02-17 MED ORDER — HEPARIN (PORCINE) IN NACL 1000-0.9 UT/500ML-% IV SOLN
INTRAVENOUS | Status: AC
  Filled 2022-02-17: qty 1000

## 2022-02-17 SURGICAL SUPPLY — 12 items
CATH INFINITI 5 FR AR1 MOD (CATHETERS) ×1 IMPLANT
CATH INFINITI JR4 5F (CATHETERS) ×1 IMPLANT
CATH OPTITORQUE TIG 4.0 5F (CATHETERS) ×1 IMPLANT
DEVICE RAD COMP TR BAND LRG (VASCULAR PRODUCTS) ×1 IMPLANT
GLIDESHEATH SLEND A-KIT 6F 22G (SHEATH) ×1 IMPLANT
GUIDEWIRE INQWIRE 1.5J.035X260 (WIRE) IMPLANT
INQWIRE 1.5J .035X260CM (WIRE) ×2
KIT ENCORE 26 ADVANTAGE (KITS) ×1 IMPLANT
KIT HEART LEFT (KITS) ×2 IMPLANT
PACK CARDIAC CATHETERIZATION (CUSTOM PROCEDURE TRAY) ×2 IMPLANT
TRANSDUCER W/STOPCOCK (MISCELLANEOUS) ×2 IMPLANT
TUBING CIL FLEX 10 FLL-RA (TUBING) ×2 IMPLANT

## 2022-02-17 NOTE — Discharge Summary (Addendum)
Physician Discharge Summary  ?Patient ID: ?Jmya Uliano ?MRN: [id number] ?DOB/AGE: 1932-05-09 88 y.o. ?[name], MD  ? ?Admit date: 02/15/2022 ?Discharge date: 02/18/2022 ? ?Primary Discharge Diagnosis ?1.  STEMI anterior wall ?2.  Coronary artery disease of the native vessel with unstable angina pectoris ?3.  Resident hypertension ?4.  Hypercholesterolemia ?5.  COPD ?6.  Gait instability due to recent fall, has home PT on board and will be continued ?7.  Chronic renal insufficiency, stage IIIa. ? ?Cardiac Studies:  ?  ?Echocardiogram 02/02/2022:    ? 1. Left ventricular ejection fraction, by estimation, is 60 to 65%. The left ventricle has normal function. The left ventricle has no regional wall motion abnormalities. Left ventricular diastolic parameters are consistent with Grade II diastolic  ?dysfunction (pseudonormalization). Elevated left atrial pressure. ? 2. Right ventricular systolic function is normal. The right ventricular size is mildly enlarged. There is mildly elevated pulmonary artery systolic pressure. ? 3. Right atrial size was mildly dilated. ? 4. The mitral valve is grossly normal. Mild mitral valve regurgitation. No evidence of mitral stenosis. ? 5. The aortic valve is tricuspid. Aortic valve regurgitation is mild. Aortic valve sclerosis is present, with no evidence of aortic valve stenosis. ? 6. The inferior vena cava is dilated in size with >50% respiratory variability, suggesting right atrial pressure of 8 mmHg. ? ?Echocardiogram 02/16/2022:    ? 1. Left ventricular ejection fraction, by estimation, is 60 to 65%. The left ventricle has normal function. The left ventricle has no regional wall motion abnormalities. ? 2. Right ventricular systolic function is normal. The right ventricular size is normal. ? 3. The mitral valve is degenerative. Trivial mitral valve regurgitation. Moderate mitral annular calcification. ? 4. The aortic valve was not well visualized. Aortic valve  regurgitation is trivial. Aortic valve sclerosis/calcification is present, without any evidence of aortic stenosis. ?  ?Left Heart Catheterization 02/15/22:  ?LV: 179/2, EDP 10 mmHg.  Ao to hold an 8/96, mean 145 mmHg.  No pressure gradient across the aortic valve.  Normal LV systolic function, EF 55 to 60%.  No wall motion abnormality. ?LM: Large vessel, mild calcification evident. ?CX: Large vessel.  Gives origin to a very large OM1 which has mild disease and continues in the AV groove. ?LAD: Large vessel giving rise to moderate-sized D1 and several small diagonals.  Distal LAD has a focal what appears like an ulcerated 90% stenosis.  LAD wraps around the apex. ?RCA: Large vessel, mild diffuse luminal irregularity.  Large PDA that arises in the mid RCA and also in the distal RCA as PDA 1 and PDA to and a large PL branch.  Minimal disease. ? ?Intervention: I felt that the distal LAD stenosis appeared to be hazy and ulcerated, felt a plaque rupture could be possible, I passed a cougar guidewire with complete resolution of the stenosis.  I pulled the wire back and repeated angiography, there is very minimal lesion and no haziness.  I felt that either the lesion had spasm that was relieved with radial arterial 200 mcg nitroglycerin had given earlier versus a plaque rupture that healed up with the passage of the guidewire. ? ?No intervention was performed.  Guide catheter disengaged and pulled out of the body over the J-wire.  There were no immediate complication. ? ?Left Heart Catheterization 02/17/22:  ?LM: Large vessel, mild calcification evident. ?CX: Large vessel.  Gives origin to a very large OM1 which has mild disease and continues in the AV groove.  The circumflex  after the origin of OM1 has a 70% stenosis. ?LAD: Large vessel giving rise to moderate-sized D1 and several small diagonals.  D1 has ostial 70% stenosis.  Distal LAD ulcerated 90% stenosis that had sealed off by passage of guidewire on 02/15/2022 is  now still widely patent with TIMI-3 flow. LAD wraps around the apex. ?RCA: Large vessel, mild diffuse luminal irregularity.  Large PDA that arises in the mid RCA and also in the distal RCA as PDA 1 and PDA to and a large PL branch.  Minimal disease. ?  ?Impression: No change in coronary anatomy from prior cardiac catheterization on 02/15/2022, the distal LAD stenosis/occlusion when she initially presented with STEMI is still widely patent with no evidence of any dissection or thrombus (Spontaneous sealing with passage of guide wire). ? ?Evaluate for noncardiac causes of chest pain. ?  ?EKG:  ?  ?EKG 02/16/2022: Normal sinus rhythm at rate of 67 bpm, left axis deviation, left anterior fascicular block.  Anterior infarct age undetermined, ST elevation in V1 and V2 suggest septal acute infarct/subacute infarct.  Compared to 02/15/2022, ST elevation less prominent. ?  ?EKG 02/15/2022 at 1714 hrs.: Normal sinus rhythm with rate of 79 bpm, left axis deviation, left anterior fascicular block.  ST elevation in V1 to V3 suggest anteroseptal infarct, acute.  Nonspecific T abnormality.  Compared to EKG at 1317, anterior ST elevation new ?  ? ? ?Radiology: ? ?DG Chest 2 View ? ?Result Date: 02/15/2022 ?CLINICAL DATA:  Chest pain EXAM: CHEST - 2 VIEW COMPARISON:  Radiograph 08/17/2021 FINDINGS: Unchanged cardiomediastinal silhouette. There is no focal airspace consolidation. There is no large pleural effusion. There is no visible pneumothorax. There is no acute osseous abnormality. Thoracic spondylosis. IMPRESSION: No evidence of acute cardiopulmonary disease. ? ?CT angiogram of the chest and abdomen 02/02/2022: ?1. Stable to minimally increased in size 5.1 x 5.1 cm (from 5.1 x 4.9 cm) suprarenal abdominal aorta aneurysm. Recommend follow-up CT/MR every 6 months and vascular consultation. This recommendation follows ACR consensus guidelines: White Paper of the ACR Incidental Findings Committee II on Vascular Findings. J Am Coll Radiol  2013; 10:789-794. Aortic aneurysm NOS (ICD10-I71.9).  ?2. Aortic Atherosclerosis (ICD10-I70.0) and Emphysema (ICD10-J43.9).  ?3. No pulmonary embolus.  ?4. Right lower pulmonary micronodule. No follow-up needed if patient is low-risk. ? ?Hospital Course: Marlys Stegmaier  is a 88 y.o.  female  with COPD, prior tobacco use disorder, moderate sized 4.5 cm abdominal aortic aneurysm, mixed hyperlipidemia, stage III chronic kidney disease and solitary kidney.  Patient was admitted to the hospital on 02/01/2022 with epigastric discomfort and chest pain, was ruled out for myocardial infarction and discharged, CT scan of the abdomen and chest revealed stable abdominal aorta. Echocardiogram revealed normal LVEF.  Chest pain felt to be noncardiac.  However on the day of admission to the hospital on 02/15/2022, she had severe chest pain with radiation to her left arm and also to her neck and low back and was brought to the emergency room, initial EKG was unchanged from baseline, however patient had right and chest pain and repeat EKG revealed ST elevation in V1 to V3 suggestive of anteroseptal infarct acute.  She was emergently transferred to cardiac catheterization lab on 02/15/2022 and cardiac catheterization revealed a focal distal LAD stenosis, fairly large size LAD and mild disease to moderate disease in her other vessels.  With the passage of guidewire, the focal hazy dissection completely sealed off, hence the guidewire was withdrawn and repeat angiography revealed patent  vessel with TIMI-3 flow and hence the lesion was not stented and left alone. ? ?However patient continued to have recurrent episodes of chest pain some of which was musculoskeletal and some hard to make out whether it is due to reocclusion of the LAD.  She was about to be discharged home on 02/17/2022 however due to recurrent chest pain was urgently taken to the cardiac catheterization lab which revealed no change in coronary anatomy and widely patent  distal LAD with TIMI-3 flow.  Hence she was kept overnight and felt stable for discharge the following morning. ? ?Recommendations on discharge: Patient was started on Plavix along with aspirin for STEMI ant

## 2022-02-17 NOTE — Progress Notes (Signed)
Pt's HR sustaining 42 with left anterior chest pain/pressure (4/10) and arm/shoulder pain. EKG done. MD Einar Gip notified, discharge canceled. BP 132/60, 1 nitro given at 1615. At 1620 CP lesser than earlier (unable to provide a number), BP 92/57. MD Einar Gip updated. ?

## 2022-02-17 NOTE — Progress Notes (Signed)
?   02/17/22 1616  ?Assess: MEWS Score  ?BP 132/60  ?Pulse Rate (!) 45  ?ECG Heart Rate (!) 44  ?Resp 12  ?SpO2 99 %  ?Assess: MEWS Score  ?MEWS Temp 0  ?MEWS Systolic 0  ?MEWS Pulse 1  ?MEWS RR 1  ?MEWS LOC 0  ?MEWS Score 2  ?MEWS Score Color Yellow  ?Assess: if the MEWS score is Yellow or Red  ?Were vital signs taken at a resting state? Yes  ?Focused Assessment No change from prior assessment  ?Early Detection of Sepsis Score *See Row Information* Medium  ?MEWS guidelines implemented *See Row Information* No, vital signs rechecked  ?Treat  ?MEWS Interventions Escalated (See documentation below)  ?Pain Scale 0-10  ?Pain Score 4  ?Pain Type Acute pain  ?Pain Location Chest  ?Pain Orientation Mid;Left  ?Notify: Charge Nurse/RN  ?Name of Charge Nurse/RN Notified Janett Billow  ?Date Charge Nurse/RN Notified 02/17/22  ?Notify: Provider  ?Provider Name/Title Einar Gip, MD  ?Date Provider Notified 02/17/22  ?Time Provider Notified 1558  ?Notification Type Page  ?Notification Reason Change in status  ?Date of Provider Response 02/17/22  ?Time of Provider Response 1600  ? ? ?

## 2022-02-17 NOTE — Progress Notes (Signed)
Subjective:  ?Patient is chest pain-free.  States that her breathing is also better.  She feels she could go home today. ? ?Intake/Output from previous day: ? ?I/O last 3 completed shifts: ?In: 1610 [P.O.:1040] ?Out: 302 [Urine:301; Stool:1] ?No intake/output data recorded. ? ?Blood pressure 124/68, pulse (!) 56, temperature 98.1 ?F (36.7 ?C), temperature source Oral, resp. rate 16, height 5' 7" (1.702 m), weight 66.4 kg, SpO2 94 %. Body mass index is 22.94 kg/m?.  ? ?Vitals with BMI 02/17/2022 02/17/2022 02/17/2022  ?Height - - -  ?Weight - - -  ?BMI - - -  ?Systolic 960 454 098  ?Diastolic 68 69 69  ?Pulse 56 0 50  ? ? ?Physical Exam ?Neck:  ?   Vascular: No JVD.  ?Cardiovascular:  ?   Rate and Rhythm: Normal rate and regular rhythm.  ?   Pulses: Intact distal pulses.     ?     Carotid pulses are 2+ on the right side and 2+ on the left side. ?     Popliteal pulses are 2+ on the right side and 2+ on the left side.  ?     Dorsalis pedis pulses are 1+ on the right side and 0 on the left side.  ?     Posterior tibial pulses are 1+ on the right side and 0 on the left side.  ?   Heart sounds: Normal heart sounds. No murmur heard. ?  No gallop.  ?Pulmonary:  ?   Effort: Pulmonary effort is normal.  ?   Breath sounds: Normal breath sounds.  ?Abdominal:  ?   General: Bowel sounds are normal.  ?   Palpations: Abdomen is soft.  ?Musculoskeletal:  ?   Right lower leg: No edema.  ?   Left lower leg: No edema.  ? ? ?Lab Results: ? ?BNP (last 3 results) ?No results for input(s): BNP in the last 8760 hours. ? ?ProBNP (last 3 results) ?No results for input(s): PROBNP in the last 8760 hours. ?BMP Latest Ref Rng & Units 02/15/2022 02/15/2022 02/15/2022  ?Glucose 70 - 99 mg/dL - 109(H) 128(H)  ?BUN 8 - 23 mg/dL - 15 16  ?Creatinine 0.44 - 1.00 mg/dL 1.27(H) 1.27(H) 1.27(H)  ?BUN/Creat Ratio 12 - 28 - - -  ?Sodium 135 - 145 mmol/L - 139 139  ?Potassium 3.5 - 5.1 mmol/L - 3.7 3.5  ?Chloride 98 - 111 mmol/L - 100 100  ?CO2 22 - 32 mmol/L - 28  29  ?Calcium 8.9 - 10.3 mg/dL - 9.7 9.9  ? ?Hepatic Function Latest Ref Rng & Units 02/15/2022 02/01/2022 01/16/2021  ?Total Protein 6.5 - 8.1 g/dL 6.9 7.6 7.1  ?Albumin 3.5 - 5.0 g/dL 4.0 4.5 4.6  ?AST 15 - 41 U/L 24 29 45(H)  ?ALT 0 - 44 U/L 16 18 36  ?Alk Phosphatase 38 - 126 U/L 34(L) 41 37(L)  ?Total Bilirubin 0.3 - 1.2 mg/dL 0.6 0.8 1.2  ? ?CBC Latest Ref Rng & Units 02/15/2022 02/15/2022 02/15/2022  ?WBC 4.0 - 10.5 K/uL 6.0 5.8 5.8  ?Hemoglobin 12.0 - 15.0 g/dL 13.0 14.3 15.0  ?Hematocrit 36.0 - 46.0 % 40.3 43.9 46.2(H)  ?Platelets 150 - 400 K/uL 287 310 317  ? ?Lab Results  ?Component Value Date  ? CHOL 175 02/15/2022  ? HDL 53 02/15/2022  ? [place] NOT CALCULATED 02/15/2022  ? TRIG 126 02/15/2022  ? CHOLHDL 3.3 02/15/2022  ?  ? ?Cardiac Panel (last 3 results) ?Recent Labs  ?  02/16/22 ?0206 02/16/22 ?4540 02/16/22 ?2158  ?TROPONINIHS 98,119* [id number]* 10,289*  ? ?  ?HEMOGLOBIN A1C ?Lab Results  ?Component Value Date  ? HGBA1C 5.2 02/15/2022  ? MPG 103 02/15/2022  ? ?TSH ?No results for input(s): TSH in the last 8760 hours. ? ? cholecalciferol  1,000 Units Oral Q supper  ? clopidogrel  75 mg Oral Daily  ? fenofibrate  54 mg Oral Daily  ? heparin  5,000 Units Subcutaneous Q8H  ? hydrALAZINE  100 mg Oral Daily  ? hydrALAZINE  50 mg Oral Q1200  ? hydrALAZINE  50 mg Oral QHS  ? isosorbide mononitrate  120 mg Oral QPM  ? multivitamin with minerals  1 tablet Oral Daily  ? [START ON 02/18/2022] nebivolol  5 mg Oral Daily  ? pantoprazole  40 mg Oral Daily  ? polyethylene glycol  17 g Oral QHS  ? pravastatin  80 mg Oral q1800  ? senna  1 tablet Oral Q breakfast  ? sodium chloride flush  3 mL Intravenous Q12H  ? sodium chloride flush  3 mL Intravenous Q12H  ? [START ON 02/18/2022] sodium chloride flush  3 mL Intravenous Q12H  ? verapamil  240 mg Oral BID PC  ?  ?Radiology:  ? ?DG Chest 2 View 02/15/2022: ?CLINICAL DATA:  Chest pain EXAM: CHEST - 2 VIEW COMPARISON:  Radiograph 08/17/2021 FINDINGS: Unchanged cardiomediastinal  silhouette. There is no focal airspace consolidation. There is no large pleural effusion. There is no visible pneumothorax. There is no acute osseous abnormality. Thoracic spondylosis. IMPRESSION: No evidence of acute cardiopulmonary disease. ?  ?CTA thorax and abdomen 02/01/2022: ?1. Stable to minimally increased in size 5.1 x 5.1 cm (from 5.1 x 4.9 cm) suprarenal abdominal aorta aneurysm. Recommend follow-up CT/MR every 6 months and vascular consultation. This recommendation follows ACR consensus guidelines: White Paper of the ACR Incidental Findings Committee II on Vascular Findings. J Am Coll Radiol 2013; ?14:782-956. Aortic aneurysm NOS (ICD10-I71.9). ?2. Aortic Atherosclerosis (ICD10-I70.0) and Emphysema (ICD10-J43.9). ?3. No pulmonary embolus. ?4. Right lower pulmonary micronodule. No follow-up needed if patient is low-risk.This recommendation follows the consensus statement: Guidelines for Management of Incidental Pulmonary Nodules Detected on CT Images: From the Fleischner Society 2017; Radiology 2017; ?284:228-243. ?5. Diffuse colonic diverticulosis with no acute diverticulitis. ?6. Atrophic right kidney. ?  ?Cardiac Studies:  ?  ?Echocardiogram 02/02/2022:    ? 1. Left ventricular ejection fraction, by estimation, is 60 to 65%. The left ventricle has normal function. The left ventricle has no regional wall motion abnormalities. Left ventricular diastolic parameters are consistent with Grade II diastolic  ?dysfunction (pseudonormalization). Elevated left atrial pressure. ? 2. Right ventricular systolic function is normal. The right ventricular size is mildly enlarged. There is mildly elevated pulmonary artery systolic pressure. ? 3. Right atrial size was mildly dilated. ? 4. The mitral valve is grossly normal. Mild mitral valve regurgitation. No evidence of mitral stenosis. ? 5. The aortic valve is tricuspid. Aortic valve regurgitation is mild. Aortic valve sclerosis is present, with no evidence of aortic  valve stenosis. ? 6. The inferior vena cava is dilated in size with >50% respiratory variability, suggesting right atrial pressure of 8 mmHg. ? ?Left Heart Catheterization 02/15/22:  ?LV: 179/2, EDP 10 mmHg.  Ao to hold an 8/96, mean 145 mmHg.  No pressure gradient across the aortic valve.  Normal LV systolic function, EF 55 to 60%.  No wall motion abnormality. ?LM: Large vessel, mild calcification evident. ?CX: Large vessel.  Gives origin to a  very large OM1 which has mild disease and continues in the AV groove. ?LAD: Large vessel giving rise to moderate-sized D1 and several small diagonals.  Distal LAD has a focal what appears like an ulcerated 90% stenosis.  LAD wraps around the apex. ?RCA: Large vessel, mild diffuse luminal irregularity.  Large PDA that arises in the mid RCA and also in the distal RCA as PDA 1 and PDA to and a large PL branch.  Minimal disease. ? ?Intervention: I felt that the distal LAD stenosis appeared to be hazy and ulcerated, felt a plaque rupture could be possible, I passed a cougar guidewire with complete resolution of the stenosis.  I pulled the wire back and repeated angiography, there is very minimal lesion and no haziness.  I felt that either the lesion had spasm that was relieved with radial arterial 200 mcg nitroglycerin had given earlier versus a plaque rupture that healed up with the passage of the guidewire. ? ?No intervention was performed.  Guide catheter disengaged and pulled out of the body over the J-wire.  There were no immediate complication. ?  ?EKG:  ? ?EKG 02/16/2022: Normal sinus rhythm at rate of 67 bpm, left axis deviation, left anterior fascicular block.  Anterior infarct age undetermined, ST elevation in V1 and V2 suggest septal acute infarct/subacute infarct.  Compared to 02/15/2022, ST elevation less prominent. ? ?EKG 02/15/2022 at 1714 hrs.: Normal sinus rhythm with rate of 79 bpm, left axis deviation, left anterior fascicular block.  ST elevation in V1 to V3  suggest anteroseptal infarct, acute.  Nonspecific T abnormality.  Compared to EKG at 1317, anterior ST elevation new ?  ?  ?Assessment  ?  ?Gina Bond is a 88 y.o. Caucasian  female  with COPD, prio
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
## 2022-02-18 ENCOUNTER — Telehealth: Payer: Self-pay

## 2022-02-18 ENCOUNTER — Encounter (HOSPITAL_COMMUNITY): Payer: Self-pay | Admitting: Cardiology

## 2022-02-18 LAB — TROPONIN T: Troponin T (Highly Sensitive): 2282 ng/L — ABNORMAL HIGH (ref 0–14)

## 2022-02-18 MED FILL — Nitroglycerin IV Soln 100 MCG/ML in D5W: INTRA_ARTERIAL | Qty: 10 | Status: AC

## 2022-02-18 MED FILL — Lidocaine HCl Local Preservative Free (PF) Inj 1%: INTRAMUSCULAR | Qty: 30 | Status: AC

## 2022-02-18 NOTE — Telephone Encounter (Signed)
Location of hospitalization: [place] ?Reason for hospitalization: chest pain/arm pain ?Date of discharge: 02/18/2022    ?Date of first communication with patient: today ?Person contacting patient: Gaye Alken, [place] ?Current symptoms: none  ?Do you understand why you were in the Hospital: Yes ?Questions regarding discharge instructions: None ?Where were you discharged to: Home ?Medications reviewed: Yes ?Allergies reviewed: Yes ?Dietary changes reviewed: Yes. Discussed low fat and low salt diet.  ?Referals reviewed: NA ?Activities of Daily Living: Able to with mild limitations ?Any transportation issues/concerns: None ?Any patient concerns: None ?Confirmed importance & date/time of Follow up appt: Yes ?Confirmed with patient if condition begins to worsen call. Pt was given the office number and encouraged to call back with questions or concerns: Yes  ? ?

## 2022-02-18 NOTE — TOC Transition Note (Signed)
Transition of Care (TOC) - CM/SW Discharge Note ? ? ?Patient Details  ?Name: Gina Bond ?MRN: [id number] ?Date of Birth: 06/20/1932 ? ?Transition of Care (TOC) CM/SW Contact:  ?Bartholomew Crews, RN ?Phone Number: [phone] ?02/18/2022, 10:23 AM ? ? ?Clinical Narrative:    ? ?Patient to transition home today. Patient to resume home health services with [facility] ([facility] for PT. Levada Dy at [facility] notified.  ? ?Final next level of care: [facility] ?Barriers to Discharge: No Barriers Identified ? ? ?Patient Goals and CMS Choice ?  ?CMS Medicare.gov Compare Post Acute Care list provided to:: Patient ?Choice offered to / list presented to : Patient ? ?Discharge Placement ?  ?           ?  ?  ?  ?  ? ?Discharge Plan and Services ?  ?Discharge Planning Services: CM Consult ?Post Acute Care Choice: Home Health          ?  ?  ?  ?  ?  ?HH Arranged: PT ?[place] Agency: [facility] ?Date HH Agency Contacted: 02/18/22 ?Time [place]: 6333 ?Representative spoke with at [place]: Levada Dy ? ?Social Determinants of Health (SDOH) Interventions ?  ? ? ?Readmission Risk Interventions ?Readmission Risk Prevention Plan 08/20/2021  ?Transportation Screening Complete  ?PCP or Specialist Appt within 5-7 Days Complete  ?Home Care Screening Complete  ?Medication Review (RN CM) Complete  ?Some recent data might be hidden  ? ? ? ? ? ?

## 2022-02-18 NOTE — Telephone Encounter (Signed)
From pt

## 2022-02-18 NOTE — Care Management Important Message (Signed)
Important Message ? ?Patient Details  ?Name: Gina Bond ?MRN: [id number] ?Date of Birth: 1932/03/28 ? ? ?Medicare Important Message Given:  Yes ? ? ? ? ?Shelda Altes ?02/18/2022, 9:37 AM ?

## 2022-02-18 NOTE — Telephone Encounter (Signed)
-----   Message from Alethia Berthold, [place] sent at 02/18/2022  9:14 AM EDT ----- ?Regarding: TOC Needed ?Please complete 1 week TOC for STEMI ? ?

## 2022-02-18 NOTE — Progress Notes (Signed)
CARDIAC REHAB PHASE I  ? ?PRE:  Rate/Rhythm: 60 SR ? ?  BP: sitting 132/80 ? ?  SaO2: 94 RA ? ?MODE:  Ambulation: 470 ft  ? ?POST:  Rate/Rhythm: 69 SR ? ?  BP: sitting 178/82  ? ?  SaO2: 96 RA ? ?Pt c/o intermittent abdominal pain that goes through to back. She is not consistent in explaining when this happens. Stated she was having it in bed before walk but denied any CP or sx while walking besides knee weakness. Slightly swaying walking. Would benefit from balance test. She will have HHPT. I spent time attempting to convince her to use her cane and carry her cellphone or lifealert outside as she likes to be independent. She has had recent falls and her daughter worries for a fall on Plavix. Pt only somewhat receptive. She says she will look in to lifealert. Gave her walking guidelines.  ?[id number]  ? ?[place], ACSM ?02/18/2022 ?9:08 AM ? ? ? ? ?

## 2022-02-20 ENCOUNTER — Telehealth: Payer: Self-pay | Admitting: Cardiology

## 2022-02-20 ENCOUNTER — Other Ambulatory Visit: Payer: Self-pay | Admitting: Cardiology

## 2022-02-20 DIAGNOSIS — R0789 Other chest pain: Secondary | ICD-10-CM

## 2022-02-20 MED ORDER — TRAMADOL HCL 50 MG PO TABS: 50.0000 mg | ORAL_TABLET | Freq: Three times a day (TID) | ORAL | 0 refills | Status: DC | PRN

## 2022-02-20 NOTE — Telephone Encounter (Signed)
Patients daughter called about chest pain and appears musculoskeletal. Will transfer Ultram Rx to local pharmacy at [facility] 24 hour store ?

## 2022-02-21 ENCOUNTER — Telehealth: Payer: Self-pay

## 2022-02-21 ENCOUNTER — Telehealth: Payer: Self-pay | Admitting: Cardiology

## 2022-02-21 NOTE — Telephone Encounter (Signed)
Gina Bond, [place]: ? ?PT came in to do resumption of care, patient was not feeling well and laying down and BP was 100/60 (supine) and after 10 minutes the BP 80/43 (supine) w/HR 41, again after 2 minutes was 96/54 (sitting) w/HR 37. Daughter wants to know if she should give patient her medications tonight. 2x for 4 weeks and another 3 week after that. Also requesting verbals for skilled nursing to come out to see the patient to access medications and condition management Please call daughter Veronda Prude.  ? ?For verbal orders : ? ?[phone] ?

## 2022-02-21 NOTE — Telephone Encounter (Signed)
Called and spoke to pts daughter, she voiced understanding and will make the pt aware.

## 2022-02-21 NOTE — Telephone Encounter (Signed)
Patient's daughter [name] calling with a few questions regarding patient- she says she was informed to have her mother stop 2 blood pressure medications (hydralazine and verapamil), and then to cut bystolic in half. Her questions are as follows... should patient resume the 2 blood pressure medications today? Does she continue taking the half bystolic med until her appt on 02/23/22? Please note patient slept better through the night upon making these changes last night per her daughter.  ?

## 2022-02-21 NOTE — Telephone Encounter (Signed)
Hydralazine PRN for BP > 140/80 and stop Verapamil and okay to take Bystolic 5 mg instead of 10 mg

## 2022-02-21 NOTE — Telephone Encounter (Signed)
Patient's daughter [name] calling with a few questions regarding patient- she says she was informed to have her mother stop 2 blood pressure medications (hydralazine and verapamil), and then to cut bystolic in half. Her questions are as follows... should patient resume the 2 blood pressure medications today? Does she continue taking the half bystolic med until her appt on 02/23/22? Please note patient slept better through the night upon making these changes last night per her daughter.

## 2022-02-23 ENCOUNTER — Ambulatory Visit: Payer: Medicare Other | Admitting: Cardiology

## 2022-02-23 ENCOUNTER — Other Ambulatory Visit: Payer: Self-pay

## 2022-02-23 ENCOUNTER — Encounter: Payer: Self-pay | Admitting: Cardiology

## 2022-02-23 VITALS — BP 158/76 | HR 58 | Temp 98.7°F | Resp 16 | Ht 67.0 in | Wt 142.8 lb

## 2022-02-23 DIAGNOSIS — I7141 Pararenal abdominal aortic aneurysm, without rupture: Secondary | ICD-10-CM

## 2022-02-23 DIAGNOSIS — N1832 Chronic kidney disease, stage 3b: Secondary | ICD-10-CM

## 2022-02-23 DIAGNOSIS — I10 Essential (primary) hypertension: Secondary | ICD-10-CM

## 2022-02-23 DIAGNOSIS — I25110 Atherosclerotic heart disease of native coronary artery with unstable angina pectoris: Secondary | ICD-10-CM
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
## 2022-02-28 ENCOUNTER — Telehealth: Payer: Self-pay

## 2022-02-28 ENCOUNTER — Other Ambulatory Visit: Payer: Self-pay | Admitting: Cardiology

## 2022-02-28 DIAGNOSIS — I10 Essential (primary) hypertension: Secondary | ICD-10-CM

## 2022-02-28 DIAGNOSIS — I209 Angina pectoris, unspecified: Secondary | ICD-10-CM

## 2022-02-28 NOTE — Telephone Encounter (Signed)
Liji from [facility] ? ?Called to get verbal orders for patient. Verbal orders confirmed.  ?

## 2022-03-02 ENCOUNTER — Ambulatory Visit (INDEPENDENT_AMBULATORY_CARE_PROVIDER_SITE_OTHER): Payer: Medicare Other | Admitting: Podiatry

## 2022-03-02 ENCOUNTER — Ambulatory Visit: Payer: Medicare Other | Admitting: Cardiology

## 2022-03-02 ENCOUNTER — Telehealth: Payer: Self-pay

## 2022-03-02 ENCOUNTER — Encounter: Payer: Self-pay | Admitting: Podiatry

## 2022-03-02 DIAGNOSIS — B351 Tinea unguium: Secondary | ICD-10-CM

## 2022-03-02 DIAGNOSIS — M79676 Pain in unspecified toe(s): Secondary | ICD-10-CM

## 2022-03-02 DIAGNOSIS — D68318 Other hemorrhagic disorder due to intrinsic circulating anticoagulants, antibodies, or inhibitors: Secondary | ICD-10-CM | POA: Diagnosis not present

## 2022-03-02 NOTE — Telephone Encounter (Signed)
Her BP tends to be up when measured by health care. Ask patient to monitor and report also to the nurese

## 2022-03-02 NOTE — Progress Notes (Signed)
Subjective: ?Gina Bond is a pleasant 88 y.o. female patient seen today painful thick toenails that are difficult to trim. Pain interferes with ambulation. Aggravating factors include wearing enclosed shoe gear. Pain is relieved with periodic professional debridement. Two weeks ago she did have a heart attack and is now on plavix.  ? ? ?PCP is [name], MD. Last visit was: 04/30/2021. ? ?Allergies  ?Allergen Reactions  ? Benicar [Olmesartan] Shortness Of Breath and Other (See Comments)  ?  Must be same manufacturer or shortness of breath (NO LONGER TAKES THIS)  ? Fenofibrate Shortness Of Breath and Other (See Comments)  ?  Must be the same manufacturer or shortness of breath ?AMNEAL IS THE PREFERRED BRAND ?  ? Other Shortness Of Breath, Rash and Other (See Comments)  ?  Wool = Rashes ?Pt states that steroid inhalers and a lot of other inhalers make her more SOB.  Pt states that changes in medications have caused her to have sob and chest pressure (change in manufacturer of medication) ?Certain dyes; Fillers used by some manufacturers  ? Sulfa Antibiotics Hives  ? Sulfacetamide Sodium Hives  ? Atorvastatin Other (See Comments)  ?  Cramps in legs ?  ? Isosorbide Dinitrate Other (See Comments)  ?  Vertigo ?  ? Magnesium Other (See Comments)  ?  Leg cramps  ? Ondansetron Other (See Comments)  ?  Made the patient appear disoriented  ? Rosuvastatin Calcium Other (See Comments)  ?  Cramps in legs  ? Spironolactone Other (See Comments)  ?  BREATHING DIFFICULTIES ?  ? Statins Other (See Comments)  ?  Cramps in legs  ? Sulfamethoxazole Rash  ? Tobramycin Rash and Other (See Comments)  ?  Blurred vision, also  ? ? ?Objective: ?Physical Exam ? ?General: Gina Bond is a pleasant 88 y.o. Caucasian female, WD, WN in NAD. AAO x 3.  ? ?Vascular:  ?Capillary refill time to digits immediate b/l. Palpable DP pulse(s) b/l lower extremities Palpable PT pulse(s) b/l lower extremities Pedal hair present.  Lower extremity skin temperature gradient within normal limits. No pain with calf compression b/l. No edema noted b/l lower extremities. ? ?Dermatological:  ?Toenails 1-5 b/l elongated, discolored, dystrophic, thickened, crumbly with subungual debris and tenderness to dorsal palpation. ? ?Musculoskeletal:  ?Normal muscle strength 5/5 to all lower extremity muscle groups bilaterally. No pain crepitus or joint limitation noted with ROM b/l. No gross bony deformities bilaterally. ? ?Neurological:  ?Protective sensation intact 5/5 intact bilaterally with 10g monofilament b/l. Vibratory sensation intact b/l. ? ?Assessment and Plan:  ?No diagnosis found. ? ?  ?-Examined patient. ?-Patient to continue soft, supportive shoe gear daily. ?-Toenails 1-5 b/l were debrided in length and girth with sterile nail nippers and dremel without iatrogenic bleeding.  ?-Patient to report any pedal injuries to medical professional immediately. ?-Patient/POA to call should there be question/concern in the interim. ? ?No follow-ups on file. ? ?Lorenda Peck, MD ? ?

## 2022-03-04 ENCOUNTER — Telehealth: Payer: Self-pay | Admitting: Cardiology

## 2022-03-04 DIAGNOSIS — R2681 Unsteadiness on feet: Secondary | ICD-10-CM

## 2022-03-04 DIAGNOSIS — I25110 Atherosclerotic heart disease of native coronary artery with unstable angina pectoris: Secondary | ICD-10-CM

## 2022-03-04 DIAGNOSIS — J432 Centrilobular emphysema: Secondary | ICD-10-CM

## 2022-03-04 DIAGNOSIS — N1832 Chronic kidney disease, stage 3b: Secondary | ICD-10-CM

## 2022-03-04 NOTE — Telephone Encounter (Signed)
ICD-10-CM   ?1. Coronary artery disease involving native coronary artery of native heart with unstable angina pectoris (HCC)  I25.110   ?  ?2. Stage 3b chronic kidney disease (HCC)  N18.32   ?  ?3. Gait instability  R26.81   ?  ?4. Centrilobular emphysema (HCC)  J43.2   ?  ? ?I have reviewed the home health plan of care, appropriate and I have signed off on the orders. ?

## 2022-03-07 ENCOUNTER — Telehealth (HOSPITAL_COMMUNITY): Payer: Self-pay | Admitting: *Deleted

## 2022-03-07 ENCOUNTER — Telehealth: Payer: Self-pay

## 2022-03-07 ENCOUNTER — Telehealth: Payer: Self-pay | Admitting: Cardiology

## 2022-03-07 DIAGNOSIS — I25110 Atherosclerotic heart disease of native coronary artery with unstable angina pectoris: Secondary | ICD-10-CM

## 2022-03-07 DIAGNOSIS — I2781 Cor pulmonale (chronic): Secondary | ICD-10-CM

## 2022-03-07 DIAGNOSIS — N1832 Chronic kidney disease, stage 3b: Secondary | ICD-10-CM

## 2022-03-07 DIAGNOSIS — J432 Centrilobular emphysema: Secondary | ICD-10-CM

## 2022-03-07 DIAGNOSIS — R2681 Unsteadiness on feet: Secondary | ICD-10-CM

## 2022-03-07 NOTE — Telephone Encounter (Signed)
ICD-10-CM   ?1. Coronary artery disease involving native coronary artery of native heart with unstable angina pectoris ([place])  I25.110   ?  ?2. Centrilobular emphysema ([place])  J43.2   ?  ?3. Gait instability  R26.81   ?  ?4. Cor pulmonale, chronic (HCC)  I27.81   ?  ?5. Stage 3b chronic kidney disease ([place])  N18.32   ?  ? ?I have reviewed orders for home health plans of care including physical therapy, signed off on the order set. ?

## 2022-03-07 NOTE — Telephone Encounter (Signed)
Gina Bond with [facility]: ? ?Requesting verbal orders for: ? ?Skilled Nursing ? ?Disease Process and Med Management ? ?1 time week for 1 week ?2 times a week for 2 weeks ?1 time a week for 4 weeks ?

## 2022-03-07 NOTE — Telephone Encounter (Signed)
Referral Message: ? ?"Adrian Prows, MD  Rowe Pavy, RN ?No restrictions.   ?  ?   ?Previous Messages ?  ?----- Message -----  ?From: Rowe Pavy, RN  ?Sent: 03/01/2022   8:27 AM EDT  ?To: Adrian Prows, MD  ? ? ?Dr. Einar Gip,  ? ?Pt  seen post hospitalization follow up is eligible to participate in cardiac rehab.  Noted in medical history, AAA 4.5 cm.  ? ?Any restrictions for activity and/or bp parameters at rest and exertion warranted?  ? ?Thank you for your valued input.  ? ?Psychologist, clinical, BSN  ?Cardiac and Pulmonary Rehab Nurse Navigator" ?

## 2022-03-14 ENCOUNTER — Telehealth: Payer: Self-pay

## 2022-03-14 NOTE — Telephone Encounter (Signed)
Poke to patient's daughter, patient has been taking hydralazine 50 mg 3 times daily.  Advised the patient increase hydralazine to 100 mg and continue to monitor blood pressure and heart rate.  For further recommendations to Dr. Einar Gip. ?

## 2022-03-15 ENCOUNTER — Emergency Department (HOSPITAL_BASED_OUTPATIENT_CLINIC_OR_DEPARTMENT_OTHER): Payer: Medicare Other

## 2022-03-15 ENCOUNTER — Other Ambulatory Visit: Payer: Self-pay

## 2022-03-15 ENCOUNTER — Emergency Department (HOSPITAL_BASED_OUTPATIENT_CLINIC_OR_DEPARTMENT_OTHER)
Admission: EM | Admit: 2022-03-15 | Discharge: 2022-03-15 | Disposition: A | Discharge status: Home / Self Care | Payer: Medicare Other | Attending: Emergency Medicine | Admitting: Emergency Medicine

## 2022-03-15 ENCOUNTER — Encounter (HOSPITAL_BASED_OUTPATIENT_CLINIC_OR_DEPARTMENT_OTHER): Payer: Self-pay

## 2022-03-15 ENCOUNTER — Encounter: Payer: Self-pay | Admitting: Cardiology

## 2022-03-15 DIAGNOSIS — Z79899 Other long term (current) drug therapy: Secondary | ICD-10-CM | POA: Insufficient documentation

## 2022-03-15 DIAGNOSIS — Z7902 Long term (current) use of antithrombotics/antiplatelets: Secondary | ICD-10-CM | POA: Diagnosis not present

## 2022-03-15 DIAGNOSIS — Z7982 Long term (current) use of aspirin: Secondary | ICD-10-CM | POA: Insufficient documentation

## 2022-03-15 DIAGNOSIS — I10 Essential (primary) hypertension: Secondary | ICD-10-CM

## 2022-03-15 DIAGNOSIS — R7989 Other specified abnormal findings of blood chemistry: Secondary | ICD-10-CM | POA: Diagnosis not present

## 2022-03-15 DIAGNOSIS — R42 Dizziness and giddiness: Secondary | ICD-10-CM | POA: Diagnosis present

## 2022-03-15 LAB — CBC WITH DIFFERENTIAL/PLATELET
Abs Immature Granulocytes: 0.04 10*3/uL (ref 0.00–0.07)
Basophils Absolute: 0.1 10*3/uL (ref 0.0–0.1)
Basophils Relative: 2 %
Eosinophils Absolute: 0.3 10*3/uL (ref 0.0–0.5)
Eosinophils Relative: 5 %
HCT: 43.5 % (ref 36.0–46.0)
Hemoglobin: 14 g/dL (ref 12.0–15.0)
Immature Granulocytes: 1 %
Lymphocytes Relative: 35 %
Lymphs Abs: 1.6 10*3/uL (ref 0.7–4.0)
MCH: 29.5 pg (ref 26.0–34.0)
MCHC: 32.2 g/dL (ref 30.0–36.0)
MCV: 91.8 fL (ref 80.0–100.0)
Monocytes Absolute: 0.5 10*3/uL (ref 0.1–1.0)
Monocytes Relative: 10 %
Neutro Abs: 2.2 10*3/uL (ref 1.7–7.7)
Neutrophils Relative %: 47 %
Platelets: 244 10*3/uL (ref 150–400)
RBC: 4.74 MIL/uL (ref 3.87–5.11)
RDW: 13.9 % (ref 11.5–15.5)
WBC: 4.7 10*3/uL (ref 4.0–10.5)
nRBC: 0 % (ref 0.0–0.2)

## 2022-03-15 LAB — BASIC METABOLIC PANEL
Anion gap: 8 (ref 5–15)
BUN: 18 mg/dL (ref 8–23)
CO2: 26 mmol/L (ref 22–32)
Calcium: 9.8 mg/dL (ref 8.9–10.3)
Chloride: 109 mmol/L (ref 98–111)
Creatinine, Ser: 1.26 mg/dL — ABNORMAL HIGH (ref 0.44–1.00)
GFR, Estimated: 41 mL/min — ABNORMAL LOW (ref >60)
Glucose, Bld: 88 mg/dL (ref 70–99)
Potassium: 3.9 mmol/L (ref 3.5–5.1)
Sodium: 143 mmol/L (ref 135–145)

## 2022-03-15 LAB — TROPONIN I (HIGH SENSITIVITY): Troponin I (High Sensitivity): 12 ng/L (ref <18)

## 2022-03-15 MED ORDER — HYDRALAZINE HCL 25 MG PO TABS
50.0000 mg | ORAL_TABLET | Freq: Once | ORAL | Status: AC
  Administered 2022-03-15: 50 mg via ORAL
  Filled 2022-03-15: qty 2

## 2022-03-15 MED ORDER — ACETAMINOPHEN 325 MG PO TABS
650.0000 mg | ORAL_TABLET | Freq: Once | ORAL | Status: AC
  Administered 2022-03-15: 650 mg via ORAL
  Filled 2022-03-15: qty 2

## 2022-03-15 NOTE — Discharge Instructions (Signed)
Work-up looked reassuring today.  Continue taking the medicine as prescribed, she was given 1 hydralazine today so she is 3 she can take later this evening if needed.  Follow-up with Dr. Nadyne Coombes tomorrow as planned. ?

## 2022-03-15 NOTE — Telephone Encounter (Signed)
Set her up to se me or CC tomorrow

## 2022-03-15 NOTE — ED Triage Notes (Signed)
C/o hypertension today, 190/90. Recent STEMI 3/14. Took hydralazine at 1220 & 1340. Has been elevated over the past few days. Denies complaints. ?

## 2022-03-15 NOTE — Telephone Encounter (Signed)
From patient.

## 2022-03-15 NOTE — ED Provider Notes (Signed)
?[facility] EMERGENCY DEPARTMENT ?Provider Note ? ? ?CSN: [id number] ?Arrival date & time: 03/15/22  1610 ? ?  ? ?History ? ?Chief Complaint  ?Patient presents with  ? Hypertension  ? ? ?Gina Bond is a 88 y.o. female. ? ? ?Hypertension ? ? ?Patient presents with concerns of hypertension.  She has been having elevated blood pressure since her heart attack in March of this year, she is on hydralazine and Bystolic.  Recently had increases of her hydralazine, she is taking 50 mg up to 6 times daily.  She has appoint with her cardiologist Dr. Einar Gip tomorrow.  Patient's daughter was very concerned about her blood pressure reading today, its been in the systolics in the 151V throughout the day despite 2 doses of the hydralazine last given at 1:40 PM.  Patient herself denies any symptoms, she does states she feels somewhat lightheaded occasionally and states she feels like there is a "pressure in my head".  Denies any specific pain anywhere, no chest pain or shortness of breath.  No vision changes or specific headache although she does have pressure. ? ?Home Medications ?Prior to Admission medications   ?Medication Sig Start Date End Date Taking? Authorizing Provider  ?acetaminophen (TYLENOL) 500 MG tablet Take 500 mg by mouth every 6 (six) hours as needed (pain).    [provider]  ?albuterol (VENTOLIN HFA) 108 (90 Base) MCG/ACT inhaler Inhale 2 puffs into the lungs every 6 (six) hours as needed for wheezing or shortness of breath.    [provider]  ?aspirin EC 81 MG EC tablet Take 1 tablet (81 mg total) by mouth daily. Swallow whole. 02/03/22   Nolberto Hanlon, MD  ?cholecalciferol (VITAMIN D3) 25 MCG (1000 UNIT) tablet Take 1,000 Units by mouth daily with supper.    [provider]  ?clopidogrel (PLAVIX) 75 MG tablet Take 1 tablet (75 mg total) by mouth daily. 02/18/22   Adrian Prows, MD  ?fenofibrate micronized (LOFIBRA) 134 MG capsule Take 134 mg by mouth daily after supper.  [facility] 05/06/21   [provider]  ?hydrALAZINE (APRESOLINE) 50 MG tablet Take 50-100 mg by mouth See admin instructions. Only take if BP is greater than 140/80    [provider]  ?isosorbide mononitrate (IMDUR) 120 MG 24 hr tablet TAKE 1 TABLET(120 MG) BY MOUTH EVERY EVENING 02/28/22   Adrian Prows, MD  ?Multiple Vitamins-Minerals (CENTRUM SILVER 50+WOMEN) TABS Take 1 tablet by mouth daily with breakfast.    [provider]  ?nebivolol (BYSTOLIC) 10 MG tablet Take 1 tablet (10 mg total) by mouth daily. ?Patient taking differently: Take 5 mg by mouth daily. 02/18/22   Adrian Prows, MD  ?nitroGLYCERIN (NITROSTAT) 0.4 MG SL tablet Place 1 tablet (0.4 mg total) under the tongue every 5 (five) minutes as needed for chest pain. 01/19/21   Aline August, MD  ?pantoprazole (PROTONIX) 40 MG tablet Take 1 tablet (40 mg total) by mouth daily. 02/18/22   Adrian Prows, MD  ?polyethylene glycol powder (GLYCOLAX/MIRALAX) 17 GM/SCOOP powder Take 17 g by mouth at bedtime. Mix with juice    [provider]  ?pravastatin (PRAVACHOL) 80 MG tablet Take 1 tablet (80 mg total) by mouth daily at 6 PM. 02/17/22   Adrian Prows, MD  ?senna (SENOKOT) 8.6 MG tablet Take 1 tablet by mouth daily with breakfast.    [provider]  ?SYSTANE ULTRA PF 0.4-0.3 % SOLN Place 1 drop into both eyes 2 (two) times daily as needed (dry eyes).  [provider]  ?traMADol (ULTRAM) 50 MG tablet Take 50 mg by mouth 3 (three) times daily as needed for moderate pain.    [provider]  ?verapamil (CALAN-SR) 240 MG CR tablet Take 240 mg by mouth 2 (two) times daily after a meal. [place] ?Patient not taking: Reported on 02/23/2022 12/23/20   [provider]  ?   ? ?Allergies    ?Benicar [olmesartan], Fenofibrate, Other, Sulfa antibiotics, Sulfacetamide sodium, Atorvastatin, Isosorbide dinitrate, Magnesium, Ondansetron, Rosuvastatin calcium, Spironolactone, Statins, Sulfamethoxazole, and Tobramycin    ? ?Review of Systems   ?Review of Systems ? ?Physical Exam ?Updated Vital Signs ?BP (!) 175/87   Pulse (!) 49   Temp 98.1 ?F (36.7 ?C) (Oral)   Resp 15   Ht '5\' 7"'$  (1.702 m)   Wt 64.8 kg   SpO2 95%   BMI 22.37 kg/m?  ?Physical Exam ?Vitals and nursing note reviewed. Exam conducted with a chaperone present.  ?Constitutional:   ?   Appearance: Normal appearance.  ?HENT:  ?   Head: Normocephalic and atraumatic.  ?Eyes:  ?   General: No scleral icterus.    ?   Right eye: No discharge.     ?   Left eye: No discharge.  ?   Extraocular Movements: Extraocular movements intact.  ?   Pupils: Pupils are equal, round, and reactive to light.  ?Cardiovascular:  ?   Rate and Rhythm: Normal rate and regular rhythm.  ?   Pulses: Normal pulses.  ?   Heart sounds: Normal heart sounds. No murmur heard. ?  No friction rub. No gallop.  ?Pulmonary:  ?   Effort: Pulmonary effort is normal. No respiratory distress.  ?   Breath sounds: Normal breath sounds.  ?Abdominal:  ?   General: Abdomen is flat. Bowel sounds are normal. There is no distension.  ?   Palpations: Abdomen is soft.  ?   Tenderness: There is no abdominal tenderness.  ?Skin: ?   General: Skin is warm and dry.  ?   Coloration: Skin is not jaundiced.  ?Neurological:  ?   Mental Status: She is alert. Mental status is at baseline.  ?   Coordination: Coordination normal.  ? ? ?ED Results / Procedures / Treatments   ?Labs ?(all labs ordered are listed, but only abnormal results are displayed) ?Labs Reviewed  ?BASIC METABOLIC PANEL - Abnormal; Notable for the following components:  ?    Result Value  ? Creatinine, Ser 1.26 (*)   ? GFR, Estimated 41 (*)   ? All other components within normal limits  ?CBC WITH DIFFERENTIAL/PLATELET  ?TROPONIN I (HIGH SENSITIVITY)  ? ? ?EKG ?EKG Interpretation ? ?Date/Time:  Tuesday March 15 2022 16:23:37 EDT ?Ventricular Rate:  46 ?PR Interval:  172 ?QRS Duration: 98 ?QT Interval:  476 ?QTC Calculation: 416 ?R Axis:   -71 ?Text  Interpretation: Sinus bradycardia Left anterior fascicular block Anteroseptal infarct , age undetermined Abnormal ECG When compared with ECG of 17-Feb-2022 16:07, PREVIOUS ECG IS PRESENT similar to prior tracing Confirmed by Wynona Dove ([id number]) on 03/15/2022 5:34:02 PM ? ?Radiology ?CT Head Wo Contrast ? ?Result Date: 03/15/2022 ?CLINICAL DATA:  Headache.  Hypertension. EXAM: CT HEAD WITHOUT CONTRAST TECHNIQUE: Contiguous axial images were obtained from the base of the skull through the vertex without intravenous contrast. RADIATION DOSE REDUCTION: This exam was performed according to the departmental dose-optimization program which includes automated exposure control, adjustment of the mA and/or kV according to patient size and/or use of  iterative reconstruction technique. COMPARISON:  Multiple previous head CTs. The most recent is 08/17/2021 FINDINGS: Brain: Stable age related cerebral atrophy, ventriculomegaly and periventricular white matter disease. No extra-axial fluid collections are identified. No CT findings for acute hemispheric infarction or intracranial hemorrhage. No mass lesions. The brainstem and cerebellum are normal. Vascular: Stable vascular calcifications. No aneurysm or hyperdense vessels. Skull: No skull fracture or bone lesions. Sinuses/Orbits: The paranasal sinuses are clear. Scattered right sided mastoid effusions. The middle ear cavities are clear. The globes are intact. Other: No scalp lesions or scalp hematoma. IMPRESSION: 1. Stable age related cerebral atrophy, ventriculomegaly and periventricular white matter disease. 2. No acute intracranial findings or mass lesions. 3. Scattered right-sided mastoid effusions. Electronically Signed   By: Marijo Sanes M.D.   On: 03/15/2022 19:16   ? ?Procedures ?Procedures  ? ? ?Medications Ordered in ED ?Medications  ?acetaminophen (TYLENOL) tablet 650 mg (650 mg Oral Given 03/15/22 1812)  ?hydrALAZINE (APRESOLINE) tablet 50 mg (50 mg Oral Given 03/15/22  1935)  ? ? ?ED Course/ Medical Decision Making/ A&P ?  ?                        ?Medical Decision Making ?Amount and/or Complexity of Data Reviewed ?Labs: ordered. ?Radiology: ordered. ? ?Risk ?OTC drugs. ?Prescription drug manageme

## 2022-03-16 ENCOUNTER — Encounter: Payer: Self-pay | Admitting: Cardiology

## 2022-03-16 ENCOUNTER — Ambulatory Visit: Payer: Medicare Other | Admitting: Cardiology

## 2022-03-16 VITALS — BP 172/83 | HR 53 | Temp 98.0°F | Resp 17 | Ht 67.0 in | Wt 143.0 lb

## 2022-03-16 DIAGNOSIS — N1832 Chronic kidney disease, stage 3b: Secondary | ICD-10-CM

## 2022-03-16 DIAGNOSIS — I10 Essential (primary) hypertension: Secondary | ICD-10-CM

## 2022-03-16 DIAGNOSIS — I7141 Pararenal abdominal aortic aneurysm, without rupture: Secondary | ICD-10-CM

## 2022-03-16 MED ORDER — ISOSORBIDE DINITRATE 30 MG PO TABS: 30.0000 mg | ORAL_TABLET | Freq: Three times a day (TID) | ORAL | 2 refills | Status: DC
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
## 2022-03-16 NOTE — Patient Instructions (Signed)
I will discontinue isosorbide mononitrate, as isosorbide dinitrate previously had worked well, I have prescribed a medication. ? ?He will start with isosorbide dinitrate 30 mg twice daily.  He can start that by taking in the morning and maybe late afternoon and if the blood pressure is still high then we will increase it to 3 times a day. ? ?If in spite of changing to isosorbide dinitrate tablet 3 times a day, blood pressure remains high, then we could certainly try increasing Bystolic to 10 mg daily and keeping a close watch on the heart rate at the same time. ?

## 2022-03-18 NOTE — Telephone Encounter (Signed)
From patient.

## 2022-03-20 ENCOUNTER — Telehealth: Payer: Self-pay | Admitting: Cardiology

## 2022-03-20 DIAGNOSIS — I10 Essential (primary) hypertension: Secondary | ICD-10-CM

## 2022-03-20 DIAGNOSIS — I2109 ST elevation (STEMI) myocardial infarction involving other coronary artery of anterior wall: Secondary | ICD-10-CM

## 2022-03-20 DIAGNOSIS — N1832 Chronic kidney disease, stage 3b: Secondary | ICD-10-CM

## 2022-03-20 DIAGNOSIS — I2781 Cor pulmonale (chronic): Secondary | ICD-10-CM

## 2022-03-20 NOTE — Telephone Encounter (Signed)
ICD-10-CM   ?1. Resistant hypertension  I10   ?  ?2. Chronic kidney disease, stage 3b (HCC)  N18.32   ?  ?3. Cor pulmonale, chronic (HCC)  I27.81   ?  ? ?I have reviewed home health plans of care, signed off on the order set. ?

## 2022-03-24 ENCOUNTER — Telehealth: Payer: Self-pay | Admitting: Cardiology

## 2022-03-24 ENCOUNTER — Encounter: Payer: Self-pay | Admitting: Cardiology

## 2022-03-24 NOTE — Telephone Encounter (Signed)
Req below meds  ? ?hydrALAZINE (APRESOLINE) 50 MG tablet ? ?[id number] [address], [address] AT [facility] [address]  [phone] ? ?pt daughter stated that pt is averaging 4 pills a day ?

## 2022-03-24 NOTE — Telephone Encounter (Signed)
Message is being responded to on mychart.

## 2022-03-24 NOTE — Telephone Encounter (Signed)
Are we refilling the pts hydralazine? It does not say it was prescribed by you.

## 2022-03-25 ENCOUNTER — Other Ambulatory Visit: Payer: Self-pay | Admitting: Cardiology

## 2022-03-25 DIAGNOSIS — I10 Essential (primary) hypertension: Secondary | ICD-10-CM

## 2022-03-25 MED ORDER — HYDRALAZINE HCL 50 MG PO TABS: 50.0000 mg | ORAL_TABLET | ORAL | 0 refills | Status: DC

## 2022-03-30 ENCOUNTER — Telehealth: Payer: Self-pay

## 2022-03-30 NOTE — Telephone Encounter (Signed)
This is good. No strict control is needed <140-150 is good

## 2022-03-30 NOTE — Telephone Encounter (Signed)
Pts home health nurse called and stated that the pts BP is 144/57. She just wanted to make you aware. Her call back number is [phone]. ?

## 2022-03-31 NOTE — Telephone Encounter (Signed)
Called pts nurse, no answer. Left vm requesting call back.

## 2022-04-01 NOTE — Telephone Encounter (Signed)
Spoke with patient daughter and she will let the home health nurse know.

## 2022-04-08 ENCOUNTER — Encounter (HOSPITAL_COMMUNITY): Payer: Self-pay

## 2022-04-08 ENCOUNTER — Telehealth (HOSPITAL_COMMUNITY): Payer: Self-pay

## 2022-04-08 NOTE — Telephone Encounter (Signed)
Pt daughter Gina Bond returned CR phone call and stated pt is interested in CR.Patient will come in for orientation on 04/12/22 @ 9AM and will attend the 1PM exercise class. ?  ?Tourist information centre manager.  ?

## 2022-04-08 NOTE — Telephone Encounter (Signed)
**  UPDATE** ?  ?Pt insurance is active and benefits verified through Medicare A/B. Co-pay $0.00, DED $226.00/$226.00 met, out of pocket $0.00/$0.00 met, co-insurance 20%. No pre-authorization required. Passport, 04/08/22 @ 11:511AM, VEH#[id number] ?  ?2ndary insurance is active and benefits verified through [employer]. Co-pay $0.00, DED $350.00/$0.00 met, out of pocket $6,000.00/$92.13 met, co-insurance 15%. No pre-authorization required. Passport, 04/08/22 @ 11:54AM, TML#[id number]  ?  ?

## 2022-04-08 NOTE — Telephone Encounter (Signed)
Attempted to call patient in regards to Cardiac Rehab - LM on VM Mailed letter 

## 2022-04-11 ENCOUNTER — Telehealth (HOSPITAL_COMMUNITY): Payer: Self-pay

## 2022-04-11 NOTE — Telephone Encounter (Signed)
Pt is no longer interested in the cardiac rehab program. ?Closed referral. ?

## 2022-04-12 ENCOUNTER — Ambulatory Visit (HOSPITAL_COMMUNITY): Payer: Medicare Other

## 2022-04-13 ENCOUNTER — Telehealth: Payer: Self-pay

## 2022-04-13 NOTE — Telephone Encounter (Signed)
Pts daughter called and stated that the pt had a headache and was confused yesterday afternoon. The neighbor called the pts daughter and the she arrived she noticed the pt was a bit confused. Today the pt called her daughter and told her she had pain in her head and that she thinks she needs help. When the daughter arrived the pt was not dress and the paper had not been grabbed off the porch. The pt was tapping her head and stating that there was pain. No visible symptoms to indicate a stroke. Pts Bp this morning was 147/80 HR 57. Pt took an extra hydralazine and her BP in the afternoon was 141/67 HR 57. Advised pt per Dr. Einar Gip to have the pt see PCP or go to the ED. Pts daughter agreed. ?

## 2022-04-18 ENCOUNTER — Ambulatory Visit (HOSPITAL_COMMUNITY): Payer: Medicare Other

## 2022-04-18 NOTE — Telephone Encounter (Signed)
From patient.

## 2022-04-19 NOTE — Telephone Encounter (Signed)
From pt

## 2022-04-20 ENCOUNTER — Ambulatory Visit (HOSPITAL_COMMUNITY): Payer: Medicare Other

## 2022-04-22 ENCOUNTER — Ambulatory Visit (HOSPITAL_COMMUNITY): Payer: Medicare Other

## 2022-04-25 ENCOUNTER — Ambulatory Visit (HOSPITAL_COMMUNITY): Payer: Medicare Other

## 2022-04-27 ENCOUNTER — Ambulatory Visit (HOSPITAL_COMMUNITY): Payer: Medicare Other

## 2022-04-29 ENCOUNTER — Ambulatory Visit (HOSPITAL_COMMUNITY): Payer: Medicare Other

## 2022-05-04 ENCOUNTER — Ambulatory Visit (HOSPITAL_COMMUNITY): Payer: Medicare Other

## 2022-05-06 ENCOUNTER — Ambulatory Visit (HOSPITAL_COMMUNITY): Payer: Medicare Other

## 2022-05-09 ENCOUNTER — Ambulatory Visit (HOSPITAL_COMMUNITY): Payer: Medicare Other

## 2022-05-10 ENCOUNTER — Other Ambulatory Visit: Payer: Self-pay | Admitting: Cardiology

## 2022-05-10 DIAGNOSIS — I10 Essential (primary) hypertension: Secondary | ICD-10-CM

## 2022-05-11 ENCOUNTER — Ambulatory Visit (HOSPITAL_COMMUNITY): Payer: Medicare Other

## 2022-05-13 ENCOUNTER — Ambulatory Visit (HOSPITAL_COMMUNITY): Payer: Medicare Other

## 2022-05-16 ENCOUNTER — Ambulatory Visit (HOSPITAL_COMMUNITY): Payer: Medicare Other

## 2022-05-16 ENCOUNTER — Other Ambulatory Visit: Payer: Self-pay | Admitting: Cardiology

## 2022-05-17 ENCOUNTER — Other Ambulatory Visit: Payer: Self-pay

## 2022-05-17 MED ORDER — PANTOPRAZOLE SODIUM 40 MG PO TBEC: 40.0000 mg | DELAYED_RELEASE_TABLET | Freq: Every day | ORAL | 3 refills | Status: DC

## 2022-05-18 ENCOUNTER — Ambulatory Visit (HOSPITAL_COMMUNITY): Payer: Medicare Other

## 2022-05-20 ENCOUNTER — Ambulatory Visit (HOSPITAL_COMMUNITY): Payer: Medicare Other

## 2022-05-20 ENCOUNTER — Other Ambulatory Visit: Payer: Self-pay

## 2022-05-20 MED ORDER — PANTOPRAZOLE SODIUM 40 MG PO TBEC: 40.0000 mg | DELAYED_RELEASE_TABLET | Freq: Every day | ORAL | 3 refills | Status: AC

## 2022-05-23 ENCOUNTER — Ambulatory Visit (HOSPITAL_COMMUNITY): Payer: Medicare Other

## 2022-05-25 ENCOUNTER — Ambulatory Visit (HOSPITAL_COMMUNITY): Payer: Medicare Other

## 2022-05-27 ENCOUNTER — Ambulatory Visit (HOSPITAL_COMMUNITY): Payer: Medicare Other

## 2022-05-30 ENCOUNTER — Ambulatory Visit (HOSPITAL_COMMUNITY): Payer: Medicare Other

## 2022-06-01 ENCOUNTER — Encounter: Payer: Self-pay | Admitting: Podiatry

## 2022-06-01 ENCOUNTER — Ambulatory Visit (INDEPENDENT_AMBULATORY_CARE_PROVIDER_SITE_OTHER): Payer: Medicare Other | Admitting: Podiatry

## 2022-06-01 ENCOUNTER — Ambulatory Visit (HOSPITAL_COMMUNITY): Payer: Medicare Other

## 2022-06-01 DIAGNOSIS — B351 Tinea unguium: Secondary | ICD-10-CM | POA: Diagnosis not present

## 2022-06-01 DIAGNOSIS — D68318 Other hemorrhagic disorder due to intrinsic circulating anticoagulants, antibodies, or inhibitors: Secondary | ICD-10-CM

## 2022-06-01 DIAGNOSIS — M79676 Pain in unspecified toe(s): Secondary | ICD-10-CM | POA: Diagnosis not present

## 2022-06-01 NOTE — Progress Notes (Signed)
Subjective: Gina Bond is a pleasant 88 y.o. female patient seen today painful thick toenails that are difficult to trim. Pain interferes with ambulation. Aggravating factors include wearing enclosed shoe gear. Pain is relieved with periodic professional debridement. Two weeks ago she did have a heart attack and is now on plavix.    PCP is [name], MD. Last visit was: 04/30/2021.  Allergies  Allergen Reactions   Benicar [Olmesartan] Shortness Of Breath and Other (See Comments)    Must be same manufacturer or shortness of breath (NO LONGER TAKES THIS)   Fenofibrate Shortness Of Breath and Other (See Comments)    Must be the same manufacturer or shortness of breath AMNEAL IS THE PREFERRED BRAND    Other Shortness Of Breath, Rash and Other (See Comments)    Wool = Rashes Pt states that steroid inhalers and a lot of other inhalers make her more SOB.  Pt states that changes in medications have caused her to have sob and chest pressure (change in manufacturer of medication) Certain dyes; Fillers used by some manufacturers   Sulfa Antibiotics Hives   Sulfacetamide Sodium Hives   Atorvastatin Other (See Comments)    Cramps in legs    Magnesium Other (See Comments)    Leg cramps   Ondansetron Other (See Comments)    Made the patient appear disoriented   Rosuvastatin Calcium Other (See Comments)    Cramps in legs   Spironolactone Other (See Comments)    BREATHING DIFFICULTIES    Statins Other (See Comments)    Cramps in legs   Sulfamethoxazole Rash   Tobramycin Rash and Other (See Comments)    Blurred vision, also    Objective: Physical Exam  General: Gina Bond is a pleasant 88 y.o. Caucasian female, WD, WN in NAD. AAO x 3.   Vascular:  Capillary refill time to digits immediate b/l. Palpable DP pulse(s) b/l lower extremities Palpable PT pulse(s) b/l lower extremities Pedal hair present. Lower extremity skin temperature gradient within normal  limits. No pain with calf compression b/l. No edema noted b/l lower extremities.  Dermatological:  Toenails 1-5 b/l elongated, discolored, dystrophic, thickened, crumbly with subungual debris and tenderness to dorsal palpation.  Musculoskeletal:  Normal muscle strength 5/5 to all lower extremity muscle groups bilaterally. No pain crepitus or joint limitation noted with ROM b/l. No gross bony deformities bilaterally.  Neurological:  Protective sensation intact 5/5 intact bilaterally with 10g monofilament b/l. Vibratory sensation intact b/l.  Assessment and Plan:  1. Pain due to onychomycosis of toenail   2. 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 -Patient to continue soft, supportive shoe gear daily. -Toenails 1-5 b/l were debrided in length and girth with sterile nail nippers and dremel without iatrogenic bleeding.  -Patient to report any pedal injuries to medical professional immediately. -Patient/POA to call should there be question/concern in the interim.  No follow-ups on file.  Lorenda Peck, DPM

## 2022-06-03 ENCOUNTER — Ambulatory Visit (HOSPITAL_COMMUNITY): Payer: Medicare Other

## 2022-06-06 ENCOUNTER — Ambulatory Visit (HOSPITAL_COMMUNITY): Payer: Medicare Other

## 2022-06-08 ENCOUNTER — Ambulatory Visit (HOSPITAL_COMMUNITY): Payer: Medicare Other

## 2022-06-10 ENCOUNTER — Ambulatory Visit (HOSPITAL_COMMUNITY): Payer: Medicare Other

## 2022-06-11 ENCOUNTER — Other Ambulatory Visit: Payer: Self-pay | Admitting: Cardiology

## 2022-06-11 DIAGNOSIS — I10 Essential (primary) hypertension: Secondary | ICD-10-CM

## 2022-07-31 ENCOUNTER — Other Ambulatory Visit: Payer: Self-pay | Admitting: Cardiology

## 2022-08-17 ENCOUNTER — Other Ambulatory Visit: Payer: Medicare Other

## 2022-08-22 ENCOUNTER — Other Ambulatory Visit: Payer: Self-pay

## 2022-08-22 MED ORDER — PRAVASTATIN SODIUM 80 MG PO TABS: 80.0000 mg | ORAL_TABLET | Freq: Every day | ORAL | 3 refills | Status: DC

## 2022-08-26 ENCOUNTER — Ambulatory Visit: Payer: Medicare Other | Admitting: Cardiology

## 2022-09-05 ENCOUNTER — Other Ambulatory Visit: Payer: Medicare Other

## 2022-09-07 ENCOUNTER — Ambulatory Visit: Payer: Medicare Other | Admitting: Podiatry

## 2022-09-08 ENCOUNTER — Ambulatory Visit (INDEPENDENT_AMBULATORY_CARE_PROVIDER_SITE_OTHER): Payer: Medicare Other | Admitting: Podiatry

## 2022-09-08 DIAGNOSIS — D68318 Other hemorrhagic disorder due to intrinsic circulating anticoagulants, antibodies, or inhibitors: Secondary | ICD-10-CM

## 2022-09-08 DIAGNOSIS — B351 Tinea unguium: Secondary | ICD-10-CM | POA: Diagnosis not present

## 2022-09-08 DIAGNOSIS — M79676 Pain in unspecified toe(s): Secondary | ICD-10-CM | POA: Diagnosis not present

## 2022-09-08 NOTE — Progress Notes (Signed)
  Subjective:  Patient ID: Gina Bond, female    DOB: [birth year],  MRN: [id number]  Chief Complaint  Patient presents with   Nail Problem    Room 15  Pt presents for RFC , no other questions or concerns     88 y.o. female presents with the above complaint. History confirmed with patient.  Patient presenting for painful thickened elongated nails x5 present on both feet.  There is dystrophic and abnormal growth related to fungal infection.  She is unable to trim them herself.  She does take Plavix due to prior heart attack.  Presenting to the office today for routine nail trim.  Objective:  Physical Exam: warm, good capillary refill, nail exam onychomycosis of the toenails, onycholysis, and dystrophic nails, no trophic changes or ulcerative lesions. DP pulses palpable, PT pulses palpable, and protective sensation intact Left Foot: normal exam, no swelling, tenderness, instability; ligaments intact, full range of motion of all ankle/foot joints  Right Foot: normal exam, no swelling, tenderness, instability; ligaments intact, full range of motion of all ankle/foot joints   No images are attached to the encounter.  Assessment:   1. Pain due to onychomycosis of toenail   2. 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  Onychomycosis with pain  -Nails palliatively debrided as below. -Educated on self-care  Procedure: Nail Debridement Rationale: Pain Type of Debridement: manual, sharp debridement. Instrumentation: Nail nipper, rotary burr. Number of Nails: 10  No follow-ups on file.         Everitt Amber, DPM [facility]

## 2022-09-12 ENCOUNTER — Ambulatory Visit: Payer: Medicare Other | Admitting: Cardiology

## 2022-09-15 ENCOUNTER — Other Ambulatory Visit: Payer: Medicare Other

## 2022-09-21 ENCOUNTER — Ambulatory Visit: Payer: Medicare Other

## 2022-09-21 DIAGNOSIS — I714 Abdominal aortic aneurysm, without rupture, unspecified: Secondary | ICD-10-CM

## 2022-09-28 ENCOUNTER — Encounter: Payer: Self-pay | Admitting: Cardiology

## 2022-09-28 ENCOUNTER — Ambulatory Visit: Payer: Medicare Other | Admitting: Cardiology

## 2022-09-28 VITALS — BP 140/74 | HR 54 | Ht 67.0 in | Wt 141.0 lb

## 2022-09-28 DIAGNOSIS — I25110 Atherosclerotic heart disease of native coronary artery with unstable angina pectoris: Secondary | ICD-10-CM

## 2022-09-28 DIAGNOSIS — I7143 Infrarenal abdominal aortic aneurysm, without rupture: Secondary | ICD-10-CM

## 2022-09-28 DIAGNOSIS — I10 Essential (primary) hypertension: Secondary | ICD-10-CM

## 2022-09-28 DIAGNOSIS — N1832 Chronic kidney disease, stage 3b: Secondary | ICD-10-CM

## 2022-09-28 MED ORDER — HYDRALAZINE HCL 50 MG PO TABS: 50.0000 mg | ORAL_TABLET | Freq: Three times a day (TID) | ORAL | 3 refills | Status: DC
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
## 2022-10-30 ENCOUNTER — Other Ambulatory Visit: Payer: Self-pay | Admitting: Cardiology

## 2022-11-10 ENCOUNTER — Ambulatory Visit: Payer: Medicare Other | Admitting: Cardiology

## 2022-11-18 ENCOUNTER — Telehealth (HOSPITAL_COMMUNITY): Payer: Self-pay

## 2022-11-24 ENCOUNTER — Encounter: Payer: Self-pay | Admitting: Cardiology

## 2022-11-25 ENCOUNTER — Other Ambulatory Visit: Payer: Self-pay

## 2022-11-25 MED ORDER — NEBIVOLOL HCL 10 MG PO TABS: 5.0000 mg | ORAL_TABLET | Freq: Every day | ORAL | 1 refills | Status: DC

## 2022-12-06 ENCOUNTER — Other Ambulatory Visit: Payer: Self-pay | Admitting: Cardiology

## 2022-12-06 DIAGNOSIS — I10 Essential (primary) hypertension: Secondary | ICD-10-CM

## 2022-12-15 ENCOUNTER — Encounter: Payer: Self-pay | Admitting: Podiatry

## 2022-12-15 ENCOUNTER — Ambulatory Visit (INDEPENDENT_AMBULATORY_CARE_PROVIDER_SITE_OTHER): Payer: Medicare Other | Admitting: Podiatry

## 2022-12-15 VITALS — BP 120/65 | HR 70

## 2022-12-15 DIAGNOSIS — M79676 Pain in unspecified toe(s): Secondary | ICD-10-CM

## 2022-12-15 DIAGNOSIS — B351 Tinea unguium: Secondary | ICD-10-CM | POA: Diagnosis not present

## 2022-12-15 DIAGNOSIS — D68318 Other hemorrhagic disorder due to intrinsic circulating anticoagulants, antibodies, or inhibitors: Secondary | ICD-10-CM

## 2022-12-15 NOTE — Progress Notes (Signed)
  Subjective:  Patient ID: Marialuiza Car, female    DOB: [birth year],  MRN: [id number]  Chief Complaint  Patient presents with   foot care    Foot care.    89 y.o. female presents with the above complaint. History confirmed with patient.  Patient presenting for painful thickened elongated nails x5 present on both feet.  There is dystrophic and abnormal growth related to fungal infection.  She is unable to trim them herself.  She does take Plavix due to prior heart attack.  Presenting to the office today for routine nail trim.  Objective:  Physical Exam: warm, good capillary refill, nail exam onychomycosis of the toenails, onycholysis, and dystrophic nails, no trophic changes or ulcerative lesions. DP pulses palpable, PT pulses palpable, and protective sensation intact Left Foot: normal exam, no swelling, tenderness, instability; ligaments intact, full range of motion of all ankle/foot joints  Right Foot: normal exam, no swelling, tenderness, instability; ligaments intact, full range of motion of all ankle/foot joints   No images are attached to the encounter.  Assessment:   1. Pain due to onychomycosis of toenail   2. 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  Onychomycosis with pain  -Nails palliatively debrided as below. -Educated on self-care  Procedure: Nail Debridement Rationale: Pain Type of Debridement: manual, sharp debridement. Instrumentation: Nail nipper, rotary burr. Number of Nails: 10  Return in about 3 months (around 03/16/2023) for RFC.         Everitt Amber, DPM [facility] / [facility]

## 2023-02-13 ENCOUNTER — Other Ambulatory Visit: Payer: Self-pay | Admitting: Cardiology

## 2023-03-06 ENCOUNTER — Other Ambulatory Visit: Payer: Self-pay | Admitting: Cardiology

## 2023-03-06 DIAGNOSIS — I10 Essential (primary) hypertension: Secondary | ICD-10-CM

## 2023-03-08 ENCOUNTER — Emergency Department (HOSPITAL_COMMUNITY): Payer: Medicare Other

## 2023-03-08 ENCOUNTER — Inpatient Hospital Stay (HOSPITAL_COMMUNITY)
Admission: EM | Admit: 2023-03-08 | Discharge: 2023-03-13 | DRG: 481 | Disposition: A | Discharge status: Skilled Nursing Facility | Payer: Medicare Other | Attending: Internal Medicine | Admitting: Internal Medicine

## 2023-03-08 DIAGNOSIS — I160 Hypertensive urgency: Secondary | ICD-10-CM | POA: Diagnosis present

## 2023-03-08 DIAGNOSIS — S72002A Fracture of unspecified part of neck of left femur, initial encounter for closed fracture: Secondary | ICD-10-CM | POA: Diagnosis present

## 2023-03-08 DIAGNOSIS — Z888 Allergy status to other drugs, medicaments and biological substances status: Secondary | ICD-10-CM | POA: Diagnosis not present

## 2023-03-08 DIAGNOSIS — I10 Essential (primary) hypertension: Secondary | ICD-10-CM | POA: Diagnosis present

## 2023-03-08 DIAGNOSIS — I252 Old myocardial infarction: Secondary | ICD-10-CM | POA: Diagnosis not present

## 2023-03-08 DIAGNOSIS — Z87891 Personal history of nicotine dependence: Secondary | ICD-10-CM | POA: Diagnosis not present

## 2023-03-08 DIAGNOSIS — Z825 Family history of asthma and other chronic lower respiratory diseases: Secondary | ICD-10-CM | POA: Diagnosis not present

## 2023-03-08 DIAGNOSIS — Z91048 Other nonmedicinal substance allergy status: Secondary | ICD-10-CM | POA: Diagnosis not present

## 2023-03-08 DIAGNOSIS — Z7902 Long term (current) use of antithrombotics/antiplatelets: Secondary | ICD-10-CM | POA: Diagnosis not present

## 2023-03-08 DIAGNOSIS — I2510 Atherosclerotic heart disease of native coronary artery without angina pectoris: Secondary | ICD-10-CM | POA: Diagnosis present

## 2023-03-08 DIAGNOSIS — W19XXXA Unspecified fall, initial encounter: Secondary | ICD-10-CM | POA: Diagnosis not present

## 2023-03-08 DIAGNOSIS — J449 Chronic obstructive pulmonary disease, unspecified: Secondary | ICD-10-CM | POA: Diagnosis present

## 2023-03-08 DIAGNOSIS — Z820 Family history of epilepsy and other diseases of the nervous system: Secondary | ICD-10-CM | POA: Diagnosis not present

## 2023-03-08 DIAGNOSIS — Z823 Family history of stroke: Secondary | ICD-10-CM

## 2023-03-08 DIAGNOSIS — I452 Bifascicular block: Secondary | ICD-10-CM | POA: Diagnosis present

## 2023-03-08 DIAGNOSIS — S72142A Displaced intertrochanteric fracture of left femur, initial encounter for closed fracture: Secondary | ICD-10-CM | POA: Diagnosis present

## 2023-03-08 DIAGNOSIS — D62 Acute posthemorrhagic anemia: Secondary | ICD-10-CM | POA: Diagnosis not present

## 2023-03-08 DIAGNOSIS — I129 Hypertensive chronic kidney disease with stage 1 through stage 4 chronic kidney disease, or unspecified chronic kidney disease: Secondary | ICD-10-CM | POA: Diagnosis present

## 2023-03-08 DIAGNOSIS — Z905 Acquired absence of kidney: Secondary | ICD-10-CM

## 2023-03-08 DIAGNOSIS — N1832 Chronic kidney disease, stage 3b: Secondary | ICD-10-CM | POA: Diagnosis present

## 2023-03-08 DIAGNOSIS — W1830XA Fall on same level, unspecified, initial encounter: Secondary | ICD-10-CM | POA: Diagnosis present

## 2023-03-08 DIAGNOSIS — Z8249 Family history of ischemic heart disease and other diseases of the circulatory system: Secondary | ICD-10-CM | POA: Diagnosis not present

## 2023-03-08 DIAGNOSIS — R413 Other amnesia: Secondary | ICD-10-CM | POA: Diagnosis present

## 2023-03-08 DIAGNOSIS — Z882 Allergy status to sulfonamides status: Secondary | ICD-10-CM | POA: Diagnosis not present

## 2023-03-08 DIAGNOSIS — I714 Abdominal aortic aneurysm, without rupture, unspecified: Secondary | ICD-10-CM | POA: Diagnosis present

## 2023-03-08 DIAGNOSIS — E785 Hyperlipidemia, unspecified: Secondary | ICD-10-CM | POA: Diagnosis present

## 2023-03-08 DIAGNOSIS — Y92009 Unspecified place in unspecified non-institutional (private) residence as the place of occurrence of the external cause: Secondary | ICD-10-CM | POA: Diagnosis not present

## 2023-03-08 LAB — COMPREHENSIVE METABOLIC PANEL
ALT: 20 U/L (ref 0–44)
AST: 46 U/L — ABNORMAL HIGH (ref 15–41)
Albumin: 4.1 g/dL (ref 3.5–5.0)
Alkaline Phosphatase: 32 U/L — ABNORMAL LOW (ref 38–126)
Anion gap: 12 (ref 5–15)
BUN: 26 mg/dL — ABNORMAL HIGH (ref 8–23)
CO2: 23 mmol/L (ref 22–32)
Calcium: 9.5 mg/dL (ref 8.9–10.3)
Chloride: 104 mmol/L (ref 98–111)
Creatinine, Ser: 1.4 mg/dL — ABNORMAL HIGH (ref 0.44–1.00)
GFR, Estimated: 36 mL/min — ABNORMAL LOW (ref >60)
Glucose, Bld: 128 mg/dL — ABNORMAL HIGH (ref 70–99)
Potassium: 3.8 mmol/L (ref 3.5–5.1)
Sodium: 139 mmol/L (ref 135–145)
Total Bilirubin: 1.6 mg/dL — ABNORMAL HIGH (ref 0.3–1.2)
Total Protein: 6.5 g/dL (ref 6.5–8.1)

## 2023-03-08 LAB — PROTIME-INR
INR: 1.1 (ref 0.8–1.2)
Prothrombin Time: 13.8 seconds (ref 11.4–15.2)

## 2023-03-08 LAB — I-STAT CHEM 8, ED
BUN: 25 mg/dL — ABNORMAL HIGH (ref 8–23)
Calcium, Ion: 1.19 mmol/L (ref 1.15–1.40)
Chloride: 105 mmol/L (ref 98–111)
Creatinine, Ser: 1.4 mg/dL — ABNORMAL HIGH (ref 0.44–1.00)
Glucose, Bld: 125 mg/dL — ABNORMAL HIGH (ref 70–99)
HCT: 42 % (ref 36.0–46.0)
Hemoglobin: 14.3 g/dL (ref 12.0–15.0)
Potassium: 3.8 mmol/L (ref 3.5–5.1)
Sodium: 142 mmol/L (ref 135–145)
TCO2: 23 mmol/L (ref 22–32)

## 2023-03-08 LAB — CBC
HCT: 43.9 % (ref 36.0–46.0)
Hemoglobin: 14.3 g/dL (ref 12.0–15.0)
MCH: 30 pg (ref 26.0–34.0)
MCHC: 32.6 g/dL (ref 30.0–36.0)
MCV: 92 fL (ref 80.0–100.0)
Platelets: 216 10*3/uL (ref 150–400)
RBC: 4.77 MIL/uL (ref 3.87–5.11)
RDW: 13.2 % (ref 11.5–15.5)
WBC: 11.1 10*3/uL — ABNORMAL HIGH (ref 4.0–10.5)
nRBC: 0 % (ref 0.0–0.2)

## 2023-03-08 LAB — ETHANOL: Alcohol, Ethyl (B): <10 mg/dL (ref <10)

## 2023-03-08 LAB — SAMPLE TO BLOOD BANK

## 2023-03-08 MED ORDER — FENTANYL CITRATE PF 50 MCG/ML IJ SOSY
50.0000 ug | PREFILLED_SYRINGE | INTRAMUSCULAR | Status: DC | PRN
  Administered 2023-03-08: 50 ug via INTRAVENOUS
  Filled 2023-03-08: qty 1
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
## 2023-03-08 NOTE — Progress Notes (Signed)
Orthopedic Tech Progress Note Patient Details:  Gina Bond November 28, 1932 [id number]  Patient ID: Gina Bond, female   DOB: 27-Apr-1932, 89 y.o.   MRN: [id number] Level II; not currently needed. [name] 03/08/2023, 10:59 PM

## 2023-03-08 NOTE — ED Triage Notes (Signed)
Patient reports laying on the floor because it was more comfortable, she denies hitting her head however patient had white matter disease, she is not plavix
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
## 2023-03-09 ENCOUNTER — Inpatient Hospital Stay (HOSPITAL_COMMUNITY): Payer: Medicare Other | Admitting: Certified Registered Nurse Anesthetist

## 2023-03-09 ENCOUNTER — Encounter (HOSPITAL_COMMUNITY): Payer: Self-pay | Admitting: Family Medicine

## 2023-03-09 ENCOUNTER — Encounter (HOSPITAL_COMMUNITY)
Admission: EM | Disposition: A | Discharge status: Skilled Nursing Facility | Payer: Self-pay | Source: Home / Self Care | Attending: Internal Medicine

## 2023-03-09 ENCOUNTER — Other Ambulatory Visit: Payer: Self-pay

## 2023-03-09 ENCOUNTER — Inpatient Hospital Stay (HOSPITAL_COMMUNITY): Payer: Medicare Other

## 2023-03-09 DIAGNOSIS — I2510 Atherosclerotic heart disease of native coronary artery without angina pectoris: Secondary | ICD-10-CM

## 2023-03-09 DIAGNOSIS — Z87891 Personal history of nicotine dependence: Secondary | ICD-10-CM

## 2023-03-09 DIAGNOSIS — I252 Old myocardial infarction: Secondary | ICD-10-CM

## 2023-03-09 DIAGNOSIS — R413 Other amnesia: Secondary | ICD-10-CM | POA: Diagnosis present

## 2023-03-09 DIAGNOSIS — J449 Chronic obstructive pulmonary disease, unspecified: Secondary | ICD-10-CM

## 2023-03-09 DIAGNOSIS — S72142A Displaced intertrochanteric fracture of left femur, initial encounter for closed fracture: Secondary | ICD-10-CM | POA: Diagnosis not present

## 2023-03-09 DIAGNOSIS — S72002A Fracture of unspecified part of neck of left femur, initial encounter for closed fracture: Secondary | ICD-10-CM | POA: Diagnosis not present

## 2023-03-09 HISTORY — PX: INTRAMEDULLARY (IM) NAIL INTERTROCHANTERIC: SHX5875

## 2023-03-09 LAB — CBC
HCT: 38.4 % (ref 36.0–46.0)
Hemoglobin: 13.1 g/dL (ref 12.0–15.0)
MCH: 30.3 pg (ref 26.0–34.0)
MCHC: 34.1 g/dL (ref 30.0–36.0)
MCV: 88.9 fL (ref 80.0–100.0)
Platelets: 205 10*3/uL (ref 150–400)
RBC: 4.32 MIL/uL (ref 3.87–5.11)
RDW: 13.4 % (ref 11.5–15.5)
WBC: 9.6 10*3/uL (ref 4.0–10.5)
nRBC: 0 % (ref 0.0–0.2)

## 2023-03-09 LAB — BASIC METABOLIC PANEL
Anion gap: 13 (ref 5–15)
BUN: 27 mg/dL — ABNORMAL HIGH (ref 8–23)
CO2: 24 mmol/L (ref 22–32)
Calcium: 9.3 mg/dL (ref 8.9–10.3)
Chloride: 103 mmol/L (ref 98–111)
Creatinine, Ser: 1.42 mg/dL — ABNORMAL HIGH (ref 0.44–1.00)
GFR, Estimated: 36 mL/min — ABNORMAL LOW (ref >60)
Glucose, Bld: 132 mg/dL — ABNORMAL HIGH (ref 70–99)
Potassium: 3.6 mmol/L (ref 3.5–5.1)
Sodium: 140 mmol/L (ref 135–145)

## 2023-03-09 LAB — LACTIC ACID, PLASMA: Lactic Acid, Venous: 2.1 mmol/L (ref 0.5–1.9)

## 2023-03-09 SURGERY — FIXATION, FRACTURE, INTERTROCHANTERIC, WITH INTRAMEDULLARY ROD
Anesthesia: General | Site: Hip | Laterality: Left

## 2023-03-09 MED ORDER — PANTOPRAZOLE SODIUM 40 MG PO TBEC
40.0000 mg | DELAYED_RELEASE_TABLET | Freq: Every day | ORAL | Status: DC
  Administered 2023-03-09 – 2023-03-13 (×5): 40 mg via ORAL
  Filled 2023-03-09 (×5): qty 1

## 2023-03-09 MED ORDER — PROPOFOL 10 MG/ML IV BOLUS
INTRAVENOUS | Status: AC
  Filled 2023-03-09: qty 20

## 2023-03-09 MED ORDER — HYDRALAZINE HCL 50 MG PO TABS
50.0000 mg | ORAL_TABLET | Freq: Three times a day (TID) | ORAL | Status: DC
  Administered 2023-03-09 – 2023-03-13 (×10): 50 mg via ORAL
  Filled 2023-03-09 (×2): qty 1
  Filled 2023-03-09: qty 2
  Filled 2023-03-09 (×9): qty 1

## 2023-03-09 MED ORDER — PROMETHAZINE HCL 25 MG/ML IJ SOLN: 6.2500 mg | INTRAMUSCULAR | Status: DC | PRN

## 2023-03-09 MED ORDER — ACETAMINOPHEN 500 MG PO TABS: 1000.0000 mg | ORAL_TABLET | Freq: Once | ORAL | Status: DC

## 2023-03-09 MED ORDER — ACETAMINOPHEN 325 MG PO TABS
325.0000 mg | ORAL_TABLET | Freq: Four times a day (QID) | ORAL | Status: DC | PRN
  Filled 2023-03-09: qty 2

## 2023-03-09 MED ORDER — HYDROCODONE-ACETAMINOPHEN 7.5-325 MG PO TABS
1.0000 | ORAL_TABLET | ORAL | Status: DC | PRN
  Administered 2023-03-12: 1 via ORAL
  Filled 2023-03-09: qty 1

## 2023-03-09 MED ORDER — CEFAZOLIN SODIUM-DEXTROSE 2-3 GM-%(50ML) IV SOLR
INTRAVENOUS | Status: DC | PRN
  Administered 2023-03-09: 2 g via INTRAVENOUS

## 2023-03-09 MED ORDER — ACETAMINOPHEN 10 MG/ML IV SOLN
INTRAVENOUS | Status: DC | PRN
  Administered 2023-03-09: 1000 mg via INTRAVENOUS

## 2023-03-09 MED ORDER — TRANEXAMIC ACID-NACL 1000-0.7 MG/100ML-% IV SOLN
1000.0000 mg | Freq: Once | INTRAVENOUS | Status: AC
  Administered 2023-03-09: 1000 mg via INTRAVENOUS
  Filled 2023-03-09: qty 100

## 2023-03-09 MED ORDER — ROCURONIUM BROMIDE 100 MG/10ML IV SOLN
INTRAVENOUS | Status: DC | PRN
  Administered 2023-03-09: 50 mg via INTRAVENOUS

## 2023-03-09 MED ORDER — POLYETHYLENE GLYCOL 3350 17 G PO PACK: 17.0000 g | PACK | Freq: Every day | ORAL | Status: DC | PRN

## 2023-03-09 MED ORDER — ACETAMINOPHEN 500 MG PO TABS
500.0000 mg | ORAL_TABLET | Freq: Four times a day (QID) | ORAL | Status: DC
  Administered 2023-03-10 – 2023-03-13 (×12): 500 mg via ORAL
  Filled 2023-03-09 (×14): qty 1

## 2023-03-09 MED ORDER — POVIDONE-IODINE 10 % EX SWAB
2.0000 | Freq: Once | CUTANEOUS | Status: DC
Start: 2023-03-09 — End: 2023-03-09

## 2023-03-09 MED ORDER — AMISULPRIDE (ANTIEMETIC) 5 MG/2ML IV SOLN: 10.0000 mg | Freq: Once | INTRAVENOUS | Status: DC | PRN

## 2023-03-09 MED ORDER — FENTANYL CITRATE (PF) 100 MCG/2ML IJ SOLN
INTRAMUSCULAR | Status: AC
  Filled 2023-03-09: qty 2

## 2023-03-09 MED ORDER — LABETALOL HCL 5 MG/ML IV SOLN: 10.0000 mg | INTRAVENOUS | Status: DC | PRN

## 2023-03-09 MED ORDER — ORAL CARE MOUTH RINSE: 15.0000 mL | Freq: Once | OROMUCOSAL | Status: AC

## 2023-03-09 MED ORDER — CEFAZOLIN SODIUM-DEXTROSE 2-4 GM/100ML-% IV SOLN
2.0000 g | Freq: Three times a day (TID) | INTRAVENOUS | Status: AC
  Administered 2023-03-09 – 2023-03-10 (×2): 2 g via INTRAVENOUS
  Filled 2023-03-09 (×2): qty 100

## 2023-03-09 MED ORDER — ONDANSETRON HCL 4 MG/2ML IJ SOLN: 4.0000 mg | Freq: Four times a day (QID) | INTRAMUSCULAR | Status: DC | PRN

## 2023-03-09 MED ORDER — TRANEXAMIC ACID-NACL 1000-0.7 MG/100ML-% IV SOLN
INTRAVENOUS | Status: AC
  Filled 2023-03-09: qty 100

## 2023-03-09 MED ORDER — MENTHOL 3 MG MT LOZG: 1.0000 | LOZENGE | OROMUCOSAL | Status: DC | PRN

## 2023-03-09 MED ORDER — METOCLOPRAMIDE HCL 5 MG/ML IJ SOLN: 5.0000 mg | Freq: Three times a day (TID) | INTRAMUSCULAR | Status: DC | PRN

## 2023-03-09 MED ORDER — CEFAZOLIN SODIUM-DEXTROSE 2-4 GM/100ML-% IV SOLN
2.0000 g | INTRAVENOUS | Status: DC
Start: 2023-03-10 — End: 2023-03-09

## 2023-03-09 MED ORDER — PROPOFOL 10 MG/ML IV BOLUS
INTRAVENOUS | Status: DC | PRN
  Administered 2023-03-09: 130 mg via INTRAVENOUS

## 2023-03-09 MED ORDER — MORPHINE SULFATE (PF) 2 MG/ML IV SOLN: 0.5000 mg | INTRAVENOUS | Status: DC | PRN

## 2023-03-09 MED ORDER — FENTANYL CITRATE (PF) 100 MCG/2ML IJ SOLN
25.0000 ug | INTRAMUSCULAR | Status: DC | PRN
  Administered 2023-03-09: 25 ug via INTRAVENOUS

## 2023-03-09 MED ORDER — ONDANSETRON HCL 4 MG PO TABS: 4.0000 mg | ORAL_TABLET | Freq: Four times a day (QID) | ORAL | Status: DC | PRN

## 2023-03-09 MED ORDER — ISOSORBIDE DINITRATE 10 MG PO TABS
30.0000 mg | ORAL_TABLET | Freq: Three times a day (TID) | ORAL | Status: DC
  Administered 2023-03-09 – 2023-03-13 (×10): 30 mg via ORAL
  Filled 2023-03-09 (×12): qty 3

## 2023-03-09 MED ORDER — PHENYLEPHRINE 80 MCG/ML (10ML) SYRINGE FOR IV PUSH (FOR BLOOD PRESSURE SUPPORT)
PREFILLED_SYRINGE | INTRAVENOUS | Status: DC | PRN
  Administered 2023-03-09: 80 ug via INTRAVENOUS
  Administered 2023-03-09: 160 ug via INTRAVENOUS

## 2023-03-09 MED ORDER — FENTANYL CITRATE (PF) 100 MCG/2ML IJ SOLN
INTRAMUSCULAR | Status: DC | PRN
  Administered 2023-03-09: 50 ug via INTRAVENOUS
  Administered 2023-03-09 (×2): 25 ug via INTRAVENOUS

## 2023-03-09 MED ORDER — FENTANYL CITRATE PF 50 MCG/ML IJ SOSY
25.0000 ug | PREFILLED_SYRINGE | INTRAMUSCULAR | Status: DC | PRN
  Administered 2023-03-09 (×4): 25 ug via INTRAVENOUS
  Filled 2023-03-09 (×3): qty 1

## 2023-03-09 MED ORDER — OXYCODONE HCL 5 MG PO TABS
5.0000 mg | ORAL_TABLET | ORAL | Status: DC | PRN
  Administered 2023-03-09 (×2): 5 mg via ORAL
  Filled 2023-03-09 (×2): qty 1

## 2023-03-09 MED ORDER — CHLORHEXIDINE GLUCONATE 4 % EX LIQD
60.0000 mL | Freq: Once | CUTANEOUS | Status: DC
Start: 2023-03-09 — End: 2023-03-09

## 2023-03-09 MED ORDER — DEXAMETHASONE SODIUM PHOSPHATE 10 MG/ML IJ SOLN
INTRAMUSCULAR | Status: DC | PRN
  Administered 2023-03-09: 5 mg via INTRAVENOUS

## 2023-03-09 MED ORDER — METHOCARBAMOL 1000 MG/10ML IJ SOLN: 500.0000 mg | Freq: Four times a day (QID) | INTRAVENOUS | Status: DC | PRN

## 2023-03-09 MED ORDER — CEFAZOLIN SODIUM 1 G IJ SOLR
INTRAMUSCULAR | Status: AC
  Filled 2023-03-09: qty 20

## 2023-03-09 MED ORDER — DOCUSATE SODIUM 100 MG PO CAPS
100.0000 mg | ORAL_CAPSULE | Freq: Two times a day (BID) | ORAL | Status: DC
  Administered 2023-03-09 – 2023-03-13 (×8): 100 mg via ORAL
  Filled 2023-03-09 (×8): qty 1

## 2023-03-09 MED ORDER — LIDOCAINE 2% (20 MG/ML) 5 ML SYRINGE
INTRAMUSCULAR | Status: DC | PRN
  Administered 2023-03-09: 60 mg via INTRAVENOUS

## 2023-03-09 MED ORDER — SENNA 8.6 MG PO TABS
1.0000 | ORAL_TABLET | Freq: Every day | ORAL | Status: DC
  Administered 2023-03-09: 8.6 mg via ORAL
  Filled 2023-03-09: qty 1

## 2023-03-09 MED ORDER — TRANEXAMIC ACID-NACL 1000-0.7 MG/100ML-% IV SOLN
INTRAVENOUS | Status: DC | PRN
  Administered 2023-03-09: 1000 mg via INTRAVENOUS

## 2023-03-09 MED ORDER — ALBUMIN HUMAN 5 % IV SOLN: INTRAVENOUS | Status: DC | PRN

## 2023-03-09 MED ORDER — PRAVASTATIN SODIUM 40 MG PO TABS
80.0000 mg | ORAL_TABLET | Freq: Every day | ORAL | Status: DC
  Administered 2023-03-09 – 2023-03-12 (×4): 80 mg via ORAL
  Filled 2023-03-09 (×4): qty 2

## 2023-03-09 MED ORDER — NEBIVOLOL HCL 5 MG PO TABS
5.0000 mg | ORAL_TABLET | Freq: Every day | ORAL | Status: DC
  Administered 2023-03-09 – 2023-03-13 (×5): 5 mg via ORAL
  Filled 2023-03-09 (×5): qty 1

## 2023-03-09 MED ORDER — METHOCARBAMOL 500 MG PO TABS
500.0000 mg | ORAL_TABLET | Freq: Four times a day (QID) | ORAL | Status: DC | PRN
  Administered 2023-03-10 – 2023-03-12 (×2): 500 mg via ORAL
  Filled 2023-03-09 (×2): qty 1

## 2023-03-09 MED ORDER — HYDROCODONE-ACETAMINOPHEN 5-325 MG PO TABS
1.0000 | ORAL_TABLET | ORAL | Status: DC | PRN
  Administered 2023-03-11 (×2): 2 via ORAL
  Administered 2023-03-13: 1 via ORAL
  Filled 2023-03-09 (×2): qty 1
  Filled 2023-03-09 (×2): qty 2

## 2023-03-09 MED ORDER — METOCLOPRAMIDE HCL 5 MG PO TABS: 5.0000 mg | ORAL_TABLET | Freq: Three times a day (TID) | ORAL | Status: DC | PRN

## 2023-03-09 MED ORDER — LACTATED RINGERS IV SOLN: INTRAVENOUS | Status: AC

## 2023-03-09 MED ORDER — TRANEXAMIC ACID-NACL 1000-0.7 MG/100ML-% IV SOLN
1000.0000 mg | INTRAVENOUS | Status: DC
Start: 2023-03-09 — End: 2023-03-09

## 2023-03-09 MED ORDER — CHLORHEXIDINE GLUCONATE 0.12 % MT SOLN
OROMUCOSAL | Status: AC
  Filled 2023-03-09: qty 15

## 2023-03-09 MED ORDER — BISACODYL 10 MG RE SUPP: 10.0000 mg | Freq: Every day | RECTAL | Status: DC | PRN

## 2023-03-09 MED ORDER — ROCURONIUM BROMIDE 10 MG/ML (PF) SYRINGE
PREFILLED_SYRINGE | INTRAVENOUS | Status: AC
  Filled 2023-03-09: qty 10

## 2023-03-09 MED ORDER — LACTATED RINGERS IV SOLN: INTRAVENOUS | Status: DC

## 2023-03-09 MED ORDER — 0.9 % SODIUM CHLORIDE (POUR BTL) OPTIME
TOPICAL | Status: DC | PRN
  Administered 2023-03-09: 1000 mL

## 2023-03-09 MED ORDER — PHENOL 1.4 % MT LIQD: 1.0000 | OROMUCOSAL | Status: DC | PRN

## 2023-03-09 MED ORDER — CHLORHEXIDINE GLUCONATE 0.12 % MT SOLN
15.0000 mL | Freq: Once | OROMUCOSAL | Status: AC
  Administered 2023-03-09: 15 mL via OROMUCOSAL

## 2023-03-09 MED ORDER — LIDOCAINE 2% (20 MG/ML) 5 ML SYRINGE
INTRAMUSCULAR | Status: AC
  Filled 2023-03-09: qty 5

## 2023-03-09 MED ORDER — EPHEDRINE SULFATE-NACL 50-0.9 MG/10ML-% IV SOSY
PREFILLED_SYRINGE | INTRAVENOUS | Status: DC | PRN
  Administered 2023-03-09: 10 mg via INTRAVENOUS
  Administered 2023-03-09: 5 mg via INTRAVENOUS

## 2023-03-09 MED ORDER — ALBUTEROL SULFATE (2.5 MG/3ML) 0.083% IN NEBU: 2.5000 mg | INHALATION_SOLUTION | RESPIRATORY_TRACT | Status: DC | PRN

## 2023-03-09 MED ORDER — ACETAMINOPHEN 10 MG/ML IV SOLN
INTRAVENOUS | Status: AC
  Filled 2023-03-09: qty 100

## 2023-03-09 SURGICAL SUPPLY — 43 items
BAG COUNTER SPONGE SURGICOUNT (BAG) ×1 IMPLANT
BIT DRILL AO GAMMA 4.2X340 (BIT) IMPLANT
BNDG COHESIVE 4X5 TAN STRL (GAUZE/BANDAGES/DRESSINGS) ×1 IMPLANT
BNDG GAUZE DERMACEA FLUFF 4 (GAUZE/BANDAGES/DRESSINGS) ×1 IMPLANT
CLSR STERI-STRIP ANTIMIC 1/2X4 (GAUZE/BANDAGES/DRESSINGS) ×1 IMPLANT
COVER PERINEAL POST (MISCELLANEOUS) ×1 IMPLANT
COVER SURGICAL LIGHT HANDLE (MISCELLANEOUS) ×1 IMPLANT
DRAPE STERI IOBAN 125X83 (DRAPES) ×1 IMPLANT
DRESSING MEPILEX FLEX 4X4 (GAUZE/BANDAGES/DRESSINGS) IMPLANT
DRSG AQUACEL AG ADV 3.5X 6 (GAUZE/BANDAGES/DRESSINGS) ×1 IMPLANT
DRSG MEPILEX FLEX 4X4 (GAUZE/BANDAGES/DRESSINGS) ×4
DURAPREP 26ML APPLICATOR (WOUND CARE) ×1 IMPLANT
ELECT REM PT RETURN 9FT ADLT (ELECTROSURGICAL) ×1
ELECTRODE REM PT RTRN 9FT ADLT (ELECTROSURGICAL) ×1 IMPLANT
GAUZE SPONGE 2X2 STRL 8-PLY (GAUZE/BANDAGES/DRESSINGS) IMPLANT
GLOVE BIO SURGEON STRL SZ 6.5 (GLOVE) ×1 IMPLANT
GLOVE BIOGEL PI IND STRL 7.5 (GLOVE) IMPLANT
GLOVE BIOGEL PI IND STRL 8 (GLOVE) ×1 IMPLANT
GLOVE ECLIPSE 8.0 STRL XLNG CF (GLOVE) ×2 IMPLANT
GLOVE INDICATOR 6.5 STRL GRN (GLOVE) ×1 IMPLANT
GLOVE SURG SS PI 7.5 STRL IVOR (GLOVE) IMPLANT
GOWN STRL REUS W/ TWL LRG LVL3 (GOWN DISPOSABLE) ×2 IMPLANT
GOWN STRL REUS W/TWL 2XL LVL3 (GOWN DISPOSABLE) IMPLANT
GOWN STRL REUS W/TWL LRG LVL3 (GOWN DISPOSABLE) ×4
K-WIRE  3.2X450M STR (WIRE) ×1
K-WIRE 3.2X450M STR (WIRE) ×1
KIT TURNOVER KIT B (KITS) ×1 IMPLANT
KWIRE 3.2X450M STR (WIRE) IMPLANT
MANIFOLD NEPTUNE II (INSTRUMENTS) ×1 IMPLANT
NAIL KIT TROCH 10X170X125 (Nail) IMPLANT
NS IRRIG 1000ML POUR BTL (IV SOLUTION) ×1 IMPLANT
PACK GENERAL/GYN (CUSTOM PROCEDURE TRAY) ×1 IMPLANT
PAD ARMBOARD 7.5X6 YLW CONV (MISCELLANEOUS) ×1 IMPLANT
SCREW LAG GAMMA 3 95MM (Screw) IMPLANT
SCREW LOCKING T2 F/T  5MMX40MM (Screw) ×1 IMPLANT
SCREW LOCKING T2 F/T 5MMX40MM (Screw) IMPLANT
SUT MNCRL AB 4-0 PS2 18 (SUTURE) ×1 IMPLANT
SUT VIC AB 0 CT1 27 (SUTURE) ×1
SUT VIC AB 0 CT1 27XBRD ANBCTR (SUTURE) ×1 IMPLANT
SUT VIC AB 2-0 CT1 27 (SUTURE) ×1
SUT VIC AB 2-0 CT1 TAPERPNT 27 (SUTURE) ×1 IMPLANT
TOWEL GREEN STERILE (TOWEL DISPOSABLE) ×1 IMPLANT
TOWEL GREEN STERILE FF (TOWEL DISPOSABLE) ×1 IMPLANT
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
## 2023-03-09 NOTE — ED Notes (Signed)
Notified Dr. Oswald Hillock of patient's Lactic acid 2.1, no new orders

## 2023-03-09 NOTE — Discharge Instructions (Signed)

## 2023-03-09 NOTE — TOC CAGE-AID Note (Signed)
Transition of Care [facility]) - CAGE-AID Screening   Patient Details  Name: Gina Bond MRN: [id number] Date of Birth: 19-Mar-1932  [facility]) CM/SW Contact:    Army Melia, RN Phone Number: 03/09/2023, 7:20 PM   Clinical Narrative:  No hx drug/alcohol abuse, no resources indicated  CAGE-AID Screening:    Have You Ever Felt You Ought to Cut Down on Your Drinking or Drug Use?: No Have People Annoyed You By Critizing Your Drinking Or Drug Use?: No Have You Felt Bad Or Guilty About Your Drinking Or Drug Use?: No Have You Ever Had a Drink or Used Drugs First Thing In The Morning to Steady Your Nerves or to Get Rid of a Hangover?: No CAGE-AID Score: 0  Substance Abuse Education Offered: No

## 2023-03-09 NOTE — Progress Notes (Signed)
PROGRESS NOTE    Gina Bond  [id number] DOB: [birth year] DOA: 03/08/2023 PCP: [name], MD    Brief Narrative:  89 year old female with history of hypertension, hyperlipidemia, coronary artery disease, CKD stage IIIb and memory loss presented to the emergency department with left hip pain after unwitnessed fall.  She lives in independent.  Her daughter was unable to reach her so went to check on her, found her on the bedroom floor and complaining of severe left hip pain.  Patient does not remember falling.  In the emergency room afebrile.  On room air.  EKG with incomplete right bundle branch block.  Chest x-ray negative.  Skeletal survey including CT scan of the head and CT cervical spine was essentially normal.  X-ray of the hip consistent with left intertrochanteric femur fracture.  Admitted with surgical consultation.   Assessment & Plan:   Closed traumatic left hip fracture: Admit.  NPO.  SCDs for DVT prophylaxis.  Maintenance fluid.  Pain control.  Immobility until surgery. Scheduled for ORIF today. Postop management, mobility and therapy as per surgery.  Chronic medical issues including Coronary artery disease, stable.  Continued on nitrates and pravastatin.  Plavix on hold. AAA, stable. Essential hypertension, blood pressure elevated in the ER.  Now controlled.  On hydralazine nebivolol. Resume all home medicine CKD stage IIIb, at about her baseline.  Monitor with acute intervention. COPD, not in exacerbation.  Albuterol as needed.   DVT prophylaxis: SCDs Start: 03/09/23 0022   Code Status: Full code Family Communication: Daughter at the bedside Disposition Plan: Status is: Inpatient Remains inpatient appropriate because: Inpatient surgery     Consultants:  Orthopedics  Procedures:  ORIF left hip  Antimicrobials:  Perioperative   Subjective: Patient seen and examined.  She came back from surgery.  Denies any complaints.  She hopes  everything went well.  Discussed postop plan. Daughter worries about getting postop delirium.  Objective: Vitals:   03/09/23 0230 03/09/23 0317 03/09/23 0732 03/09/23 1113  BP: (!) 156/81 133/70 133/65 133/67  Pulse: 68 67 67 (!) 57  Resp: 20 18 16 16   Temp:  98.4 F (36.9 C) 98.5 F (36.9 C) 98.2 F (36.8 C)  TempSrc:  Oral Oral   SpO2: 96% 93% 98% 91%  Weight:    62 kg  Height:  5\' 6"  (1.676 m)  5\' 6"  (1.676 m)    Intake/Output Summary (Last 24 hours) at 03/09/2023 1444 Last data filed at 03/09/2023 1434 Gross per 24 hour  Intake 500 ml  Output 250 ml  Net 250 ml   Filed Weights   03/08/23 2235 03/09/23 1113  Weight: 68 kg 62 kg    Examination:  Looks comfortable.  On room air. Left thigh incision clean and dry.  Immediate postop.  Not removed by me.   Data Reviewed: I have personally reviewed following labs and imaging studies  CBC: Recent Labs  Lab 03/08/23 2234 03/08/23 2240 03/09/23 0412  WBC 11.1*  --  9.6  HGB 14.3 14.3 13.1  HCT 43.9 42.0 38.4  MCV 92.0  --  88.9  PLT 216  --  [id number]   Basic Metabolic Panel: Recent Labs  Lab 03/08/23 2234 03/08/23 2240 03/09/23 0412  NA 139 142 140  K 3.8 3.8 3.6  CL 104 105 103  CO2 23  --  24  GLUCOSE 128* 125* 132*  BUN 26* 25* 27*  CREATININE 1.40* 1.40* 1.42*  CALCIUM 9.5  --  9.3   GFR:  Estimated Creatinine Clearance: 25.6 mL/min (A) (by C-G formula based on SCr of 1.42 mg/dL (H)). Liver Function Tests: Recent Labs  Lab 03/08/23 2234  AST 46*  ALT 20  ALKPHOS 32*  BILITOT 1.6*  PROT 6.5  ALBUMIN 4.1   No results for input(s): "LIPASE", "AMYLASE" in the last 168 hours. No results for input(s): "AMMONIA" in the last 168 hours. Coagulation Profile: Recent Labs  Lab 03/08/23 2234  INR 1.1   Cardiac Enzymes: No results for input(s): "CKTOTAL", "CKMB", "CKMBINDEX", "TROPONINI" in the last 168 hours. BNP (last 3 results) No results for input(s): "PROBNP" in the last 8760 hours. HbA1C: No  results for input(s): "HGBA1C" in the last 72 hours. CBG: No results for input(s): "GLUCAP" in the last 168 hours. Lipid Profile: No results for input(s): "CHOL", "HDL", "LDLCALC", "TRIG", "CHOLHDL", "LDLDIRECT" in the last 72 hours. Thyroid Function Tests: No results for input(s): "TSH", "T4TOTAL", "FREET4", "T3FREE", "THYROIDAB" in the last 72 hours. Anemia Panel: No results for input(s): "VITAMINB12", "FOLATE", "FERRITIN", "TIBC", "IRON", "RETICCTPCT" in the last 72 hours. Sepsis Labs: Recent Labs  Lab 03/08/23 2230  LATICACIDVEN 2.1*    No results found for this or any previous visit (from the past 240 hour(s)).       Radiology Studies: CT CERVICAL SPINE WO CONTRAST  Result Date: 03/08/2023 CLINICAL DATA:  Fall on blood thinners EXAM: CT HEAD WITHOUT CONTRAST CT CERVICAL SPINE WITHOUT CONTRAST TECHNIQUE: Multidetector CT imaging of the head and cervical spine was performed following the standard protocol without intravenous contrast. Multiplanar CT image reconstructions of the cervical spine were also generated. RADIATION DOSE REDUCTION: This exam was performed according to the departmental dose-optimization program which includes automated exposure control, adjustment of the mA and/or kV according to patient size and/or use of iterative reconstruction technique. COMPARISON:  03/15/2022 CT head, 08/17/2021 CT cervical spine FINDINGS: CT HEAD FINDINGS Brain: No evidence of acute infarct, hemorrhage, mass, mass effect, or midline shift. No hydrocephalus or extra-axial fluid collection. Periventricular white matter changes, likely the sequela of chronic small vessel ischemic disease. Vascular: No hyperdense vessel. Skull: Negative for fracture or focal lesion. Sinuses/Orbits: No acute finding. Other: The mastoid air cells are well aerated. CT CERVICAL SPINE FINDINGS Alignment: Straightening and mild reversal of the normal cervical lordosis, with trace stepwise anterolisthesis of C3 on C4, C4  on C5, and C5 on, unchanged. Skull base and vertebrae: No acute fracture. No primary bone lesion or focal pathologic process. Soft tissues and spinal canal: No prevertebral fluid or swelling. No visible canal hematoma. Disc levels: Degenerative changes in the cervical spine. No significant spinal canal stenosis. Upper chest: Emphysema. No focal pulmonary opacity or pleural effusion. IMPRESSION: 1. No acute intracranial process. 2. No acute fracture or traumatic subluxation in the cervical spine. Electronically Signed   By: Merilyn Baba M.D.   On: 03/08/2023 23:09   CT HEAD WO CONTRAST  Result Date: 03/08/2023 CLINICAL DATA:  Fall on blood thinners EXAM: CT HEAD WITHOUT CONTRAST CT CERVICAL SPINE WITHOUT CONTRAST TECHNIQUE: Multidetector CT imaging of the head and cervical spine was performed following the standard protocol without intravenous contrast. Multiplanar CT image reconstructions of the cervical spine were also generated. RADIATION DOSE REDUCTION: This exam was performed according to the departmental dose-optimization program which includes automated exposure control, adjustment of the mA and/or kV according to patient size and/or use of iterative reconstruction technique. COMPARISON:  03/15/2022 CT head, 08/17/2021 CT cervical spine FINDINGS: CT HEAD FINDINGS Brain: No evidence of acute infarct, hemorrhage,  mass, mass effect, or midline shift. No hydrocephalus or extra-axial fluid collection. Periventricular white matter changes, likely the sequela of chronic small vessel ischemic disease. Vascular: No hyperdense vessel. Skull: Negative for fracture or focal lesion. Sinuses/Orbits: No acute finding. Other: The mastoid air cells are well aerated. CT CERVICAL SPINE FINDINGS Alignment: Straightening and mild reversal of the normal cervical lordosis, with trace stepwise anterolisthesis of C3 on C4, C4 on C5, and C5 on, unchanged. Skull base and vertebrae: No acute fracture. No primary bone lesion or focal  pathologic process. Soft tissues and spinal canal: No prevertebral fluid or swelling. No visible canal hematoma. Disc levels: Degenerative changes in the cervical spine. No significant spinal canal stenosis. Upper chest: Emphysema. No focal pulmonary opacity or pleural effusion. IMPRESSION: 1. No acute intracranial process. 2. No acute fracture or traumatic subluxation in the cervical spine. Electronically Signed   By: Merilyn Baba M.D.   On: 03/08/2023 23:09   DG Hip Unilat W or Wo Pelvis 2-3 Views Left  Result Date: 03/08/2023 CLINICAL DATA:  Fall. EXAM: DG HIP (WITH OR WITHOUT PELVIS) 2-3V LEFT COMPARISON:  CT abdomen and pelvis 12/04/2019 FINDINGS: Acute comminuted displaced intertrochanteric fracture of the left femur. The lesser trochanter is mildly displaced medially and inferiorly. There is mild lateral displacement of the dominant fragment. The left femoral head remains located within the acetabulum. Surgical clips over the right hemipelvis. IMPRESSION: Acute comminuted displaced intertrochanteric fracture of the left femur. Electronically Signed   By: Placido Sou M.D.   On: 03/08/2023 22:59   DG Chest Port 1 View  Result Date: 03/08/2023 CLINICAL DATA:  Patient reports lying on the floor because it was more comfortable. Trauma. Denies hitting head. She has not on Plavix. EXAM: PORTABLE CHEST 1 VIEW COMPARISON:  02/15/2022 FINDINGS: Unchanged cardiomediastinal silhouette. Aortic atherosclerotic calcification. No focal consolidation, pleural effusion, or pneumothorax. No displaced rib fractures. IMPRESSION: No active disease. Electronically Signed   By: Placido Sou M.D.   On: 03/08/2023 22:57        Scheduled Meds:  chlorhexidine       [MAR Hold] hydrALAZINE  50 mg Oral Q8H   [MAR Hold] isosorbide dinitrate  30 mg Oral Q8H   [MAR Hold] nebivolol  5 mg Oral Daily   [MAR Hold] pantoprazole  40 mg Oral Daily   [MAR Hold] pravastatin  80 mg Oral q1800   [MAR Hold] senna  1 tablet Oral  Q breakfast   Continuous Infusions:  lactated ringers 85 mL/hr at 03/09/23 1351   lactated ringers 10 mL/hr at 03/09/23 1142     LOS: 1 day    Time spent: 35 minutes    Barb Merino, MD [facility] Pager [phone]

## 2023-03-09 NOTE — Progress Notes (Signed)
Initial Nutrition Assessment  DOCUMENTATION CODES:   Not applicable  INTERVENTION:  - Add Ensure Enlive po BID, each supplement provides 350 kcal and 20 grams of protein. (Once diet is advanced).   NUTRITION DIAGNOSIS:   Increased nutrient needs related to hip fracture as evidenced by estimated needs.  GOAL:   Patient will meet greater than or equal to 90% of their needs  MONITOR:   PO intake, Diet advancement  REASON FOR ASSESSMENT:   Consult Hip fracture protocol  ASSESSMENT:   89 y.o. female admits related to being found on the floor and left hip pain. PMH includes: HTN, HLD, CAD, AAA, CKD stage 3, and memory loss. Pt is currently receiving medical management related to closed left hip fracture.  Meds reviewed: senokot. Labs reviewed:  BUN/Creatinine elevated.   Pt is currently NPO for surgical intervention. No significant wt loss per record. RD will continue to monitor for diet advancement and PO intakes. Will add supplements once diet is advanced.   NUTRITION - FOCUSED PHYSICAL EXAM:  Reassess at follow-up.  Diet Order:   Diet Order             Diet NPO time specified Except for: Sips with Meds  Diet effective now                   EDUCATION NEEDS:   Not appropriate for education at this time  Skin:  Skin Assessment: Skin Integrity Issues: Skin Integrity Issues:: Incisions Incisions: right thigh/knee  Last BM:  4/4  Height:   Ht Readings from Last 1 Encounters:  03/09/23 5\' 6"  (1.676 m)    Weight:   Wt Readings from Last 1 Encounters:  03/09/23 62 kg    Ideal Body Weight:     BMI:  Body mass index is 22.08 kg/m.  Estimated Nutritional Needs:   Kcal:  [id number] kcals  Protein:  100-120 gm  Fluid:  >/= 2.0 L  Thalia Bloodgood, RD, LDN, CNSC.
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
## 2023-03-09 NOTE — Op Note (Signed)
DATE OF SURGERY:  03/09/2023  TIME: 1:47 PM  PATIENT NAME:  Gina Bond  AGE: 89 y.o.  PRE-OPERATIVE DIAGNOSIS:  LEFT HIP FRACTURE  POST-OPERATIVE DIAGNOSIS:  SAME  PROCEDURE:  INTRAMEDULLARY (IM) NAIL INTERTROCHANTERIC  SURGEON:  Kobie Whidby D Iwalani Templeton  ASSISTANT:  Aggie Moats, PA-C, she was present and scrubbed throughout the case, critical for completion in a timely fashion, and for retraction, instrumentation, and closure.   OPERATIVE IMPLANTS: [employer]ker Gamma Nail  PREOPERATIVE INDICATIONS:  [name] is a 89 y.o. year old who fell and suffered a hip fracture. She was brought into the ER and then admitted and optimized and then elected for surgical intervention.    The risks benefits and alternatives were discussed with the patient including but not limited to the risks of nonoperative treatment, versus surgical intervention including infection, bleeding, nerve injury, malunion, nonunion, hardware prominence, hardware failure, need for hardware removal, blood clots, cardiopulmonary complications, morbidity, mortality, among others, and they were willing to proceed.    OPERATIVE PROCEDURE:  The patient was brought to the operating room and placed in the supine position. General anesthesia was administered. She was placed on the fracture table.  Closed reduction was performed under C-arm guidance. Time out was then performed after sterile prep and drape. She received preoperative antibiotics.  I first performed a closed manipulation of the fracture to reduce it on the OR table.  Incision was made proximal to the greater trochanter. A guidewire was placed in the appropriate position. Confirmation was made on AP and lateral views. The above-named nail was opened. I opened the proximal femur with a reamer. I then placed the nail by hand easily down. I did not need to ream the femur.  Once the nail was completely seated, I placed a guidepin into the femoral head into the  center center position. I measured the length, and then reamed the lateral cortex and up into the head. I then placed the lag screw. Slight compression was applied. Anatomic fixation achieved.  I then secured the proximal interlocking bolt, and took off a half a turn, and then removed the instruments, and took final C-arm pictures AP and lateral the entire length of the leg.  I then placed a distal interlock screw.   Anatomic reconstruction was achieved, and the wounds were irrigated copiously and closed with a layered closure. The patient was awakened and returned to PACU in stable and satisfactory condition. There no complications and the patient tolerated the procedure well.  She will be weightbearing as tolerated, and will be on chemical px  for a period of four weeks after discharge.   Edmonia Lynch, M.D.
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
## 2023-03-09 NOTE — Anesthesia Preprocedure Evaluation (Addendum)
Anesthesia Evaluation  Patient identified by MRN, date of birth, ID band Patient confused    Reviewed: Allergy & Precautions, Patient's Chart, lab work & pertinent test results  Airway Mallampati: II  TM Distance: >3 FB Neck ROM: Full    Dental  (+) Edentulous Upper, Edentulous Lower   Pulmonary COPD, former smoker   Pulmonary exam normal        Cardiovascular hypertension, + CAD and + Past MI   Rhythm:Regular Rate:Normal  Cath Left Heart Catheterization 02/17/22:  LM: Large vessel, mild calcification evident. CX: Large vessel.  Gives origin to a very large OM1 which has mild disease and continues in the AV groove.  The circumflex after the origin of OM1 has a 70% stenosis. LAD: Large vessel giving rise to moderate-sized D1 and several small diagonals.  D1 has ostial 70% stenosis.  Distal LAD ulcerated 90% stenosis that had sealed off by passage of guidewire on 02/15/2022 is now still widely patent with TIMI-3 flow. LAD wraps around the apex. RCA: Large vessel, mild diffuse luminal irregularity.  Large PDA that arises in the mid RCA and also in the distal RCA as PDA 1 and PDA to and a large PL branch.  Minimal disease.   Impression: No change in coronary anatomy from prior cardiac catheterization on 02/15/2022, the distal LAD stenosis/occlusion when she initially presented with STEMI is still widely patent with no evidence of any dissection or thrombus.     Neuro/Psych  PSYCHIATRIC DISORDERS  Depression    negative neurological ROS     GI/Hepatic Neg liver ROS,GERD  ,,  Endo/Other  negative endocrine ROS    Renal/GU Renal InsufficiencyRenal disease     Musculoskeletal negative musculoskeletal ROS (+)    Abdominal Normal abdominal exam  (+)   Peds  Hematology negative hematology ROS (+)   Anesthesia Other Findings   Reproductive/Obstetrics                             Anesthesia  Physical Anesthesia Plan  ASA: 3  Anesthesia Plan: General   Post-op Pain Management: Tylenol PO (pre-op)* and Toradol IV (intra-op)*   Induction: Intravenous  PONV Risk Score and Plan: Ondansetron, Dexamethasone and Treatment may vary due to age or medical condition  Airway Management Planned: Mask and Oral ETT  Additional Equipment: None  Intra-op Plan:   Post-operative Plan: Extubation in OR  Informed Consent: I have reviewed the patients History and Physical, chart, labs and discussed the procedure including the risks, benefits and alternatives for the proposed anesthesia with the patient or authorized representative who has indicated his/her understanding and acceptance.     Consent reviewed with POA  Plan Discussed with: Anesthesiologist  Anesthesia Plan Comments:         Anesthesia Quick Evaluation

## 2023-03-09 NOTE — Transfer of Care (Signed)
Immediate Anesthesia Transfer of Care Note  Patient: Gina Bond  Procedure(s) Performed: INTRAMEDULLARY (IM) NAIL INTERTROCHANTERIC (Left: Hip)  Patient Location: PACU  Anesthesia Type:General  Level of Consciousness: drowsy and patient cooperative  Airway & Oxygen Therapy: Patient Spontanous Breathing and Patient connected to nasal cannula oxygen  Post-op Assessment: Report given to RN and Post -op Vital signs reviewed and stable  Post vital signs: Reviewed and stable  Last Vitals:  Vitals Value Taken Time  BP 145/73 03/09/23 1530  Temp 36.5 C 03/09/23 1521  Pulse 55 03/09/23 1532  Resp 15 03/09/23 1532  SpO2 94 % 03/09/23 1532  Vitals shown include unvalidated device data.  Last Pain:  Vitals:   03/09/23 0750  TempSrc:   PainSc: 10-Worst pain ever      Patients Stated Pain Goal: 2 ([id number])  Complications: No notable events documented.

## 2023-03-09 NOTE — H&P (Signed)
I spoke with EDP. I reviewed her imaging. Patient of mine with a left hip fracture. Current plan is admission by Hospitalist. I will plan for OR Thursday for fixation formal consult to follow.   Edmonia Lynch

## 2023-03-09 NOTE — Anesthesia Procedure Notes (Signed)
Procedure Name: Intubation Date/Time: 03/09/2023 1:58 PM  Performed by: Gwyndolyn Saxon, CRNAPre-anesthesia Checklist: Patient identified, Emergency Drugs available, Suction available and Patient being monitored Patient Re-evaluated:Patient Re-evaluated prior to induction Oxygen Delivery Method: Circle system utilized Preoxygenation: Pre-oxygenation with 100% oxygen Induction Type: IV induction Ventilation: Mask ventilation without difficulty Laryngoscope Size: Miller and 2 Grade View: Grade I Tube type: Oral Tube size: 6.5 mm Number of attempts: 1 Airway Equipment and Method: Stylet Placement Confirmation: ETT inserted through vocal cords under direct vision, positive ETCO2 and breath sounds checked- equal and bilateral Secured at: 20 cm Tube secured with: Tape Dental Injury: Teeth and Oropharynx as per pre-operative assessment

## 2023-03-09 NOTE — Progress Notes (Signed)
   03/09/23 1133  Spiritual Encounters  Type of Visit Follow up  Referral source Chaplain team  Reason for visit Routine spiritual support  OnCall Visit No   Chaplain attempted to visit PT and family. PT in surgery and not available.

## 2023-03-09 NOTE — ED Notes (Signed)
Floor notified of pt transport to floor.

## 2023-03-09 NOTE — Interval H&P Note (Signed)
History and Physical Interval Note:  03/09/2023 1:45 PM  Gina Bond  has presented today for surgery, with the diagnosis of LEFT HIP FRACTURE.  The various methods of treatment have been discussed with the patient and family. After consideration of risks, benefits and other options for treatment, the patient has consented to  Procedure(s): INTRAMEDULLARY (IM) NAIL INTERTROCHANTERIC (Left) as a surgical intervention.  The patient's history has been reviewed, patient examined, no change in status, stable for surgery.  I have reviewed the patient's chart and labs.  Questions were answered to the patient's satisfaction.     [name]

## 2023-03-10 DIAGNOSIS — S72002A Fracture of unspecified part of neck of left femur, initial encounter for closed fracture: Secondary | ICD-10-CM | POA: Diagnosis not present

## 2023-03-10 LAB — CBC
HCT: 23.9 % — ABNORMAL LOW (ref 36.0–46.0)
Hemoglobin: 8 g/dL — ABNORMAL LOW (ref 12.0–15.0)
MCH: 30.3 pg (ref 26.0–34.0)
MCHC: 33.5 g/dL (ref 30.0–36.0)
MCV: 90.5 fL (ref 80.0–100.0)
Platelets: 147 10*3/uL — ABNORMAL LOW (ref 150–400)
RBC: 2.64 MIL/uL — ABNORMAL LOW (ref 3.87–5.11)
RDW: 13.5 % (ref 11.5–15.5)
WBC: 8 10*3/uL (ref 4.0–10.5)
nRBC: 0 % (ref 0.0–0.2)

## 2023-03-10 LAB — BASIC METABOLIC PANEL
Anion gap: 9 (ref 5–15)
BUN: 33 mg/dL — ABNORMAL HIGH (ref 8–23)
CO2: 24 mmol/L (ref 22–32)
Calcium: 8.2 mg/dL — ABNORMAL LOW (ref 8.9–10.3)
Chloride: 104 mmol/L (ref 98–111)
Creatinine, Ser: 1.54 mg/dL — ABNORMAL HIGH (ref 0.44–1.00)
GFR, Estimated: 32 mL/min — ABNORMAL LOW (ref >60)
Glucose, Bld: 143 mg/dL — ABNORMAL HIGH (ref 70–99)
Potassium: 3.5 mmol/L (ref 3.5–5.1)
Sodium: 137 mmol/L (ref 135–145)

## 2023-03-10 MED ORDER — CLOPIDOGREL BISULFATE 75 MG PO TABS: 75.0000 mg | ORAL_TABLET | Freq: Every day | ORAL | Status: DC

## 2023-03-10 MED ORDER — ENOXAPARIN SODIUM 30 MG/0.3ML IJ SOSY
30.0000 mg | PREFILLED_SYRINGE | INTRAMUSCULAR | Status: DC
  Administered 2023-03-10: 30 mg via SUBCUTANEOUS
  Filled 2023-03-10: qty 0.3

## 2023-03-10 MED ORDER — ENSURE ENLIVE PO LIQD
237.0000 mL | Freq: Two times a day (BID) | ORAL | Status: DC
  Administered 2023-03-10 – 2023-03-13 (×6): 237 mL via ORAL

## 2023-03-10 NOTE — Progress Notes (Signed)
PROGRESS NOTE    Gina Bond  ZOX:[id number] DOB: 1932-05-02 DOA: 03/08/2023 PCP: Verlon Au, MD    Brief Narrative:  89 year old female with history of hypertension, hyperlipidemia, coronary artery disease, CKD stage IIIb and memory loss presented to the emergency department with left hip pain after unwitnessed fall.  She lives in independent.  Her daughter was unable to reach her so went to check on her, found her on the bedroom floor and complaining of severe left hip pain.  Patient does not remember falling.  In the emergency room afebrile.  On room air.  EKG with incomplete right bundle branch block.  Chest x-ray negative.  Skeletal survey including CT scan of the head and CT cervical spine was essentially normal.  X-ray of the hip consistent with left intertrochanteric femur fracture.  Admitted with surgical consultation.   Assessment & Plan:   Closed traumatic left hip fracture: Status post ORIF with IM nail intertrochanteric fracture, Dr. Eulah Pont.  Postop day 1. Weightbearing as tolerated left lower extremity. Lovenox for DVT prophylaxis for 4 weeks. Pain managed with oral pain medications along with bowel regimen. Work with PT OT. Refer to a SNF  Chronic medical issues including Coronary artery disease, stable.  Continued on nitrates and pravastatin.  Plavix on hold.  Will resume when okay by surgery. AAA, stable. Essential hypertension, blood pressure elevated in the ER.  Now controlled.  On hydralazine nebivolol. Resume all home medicine. CKD stage IIIb, at about her baseline.   COPD, not in exacerbation.  Albuterol as needed.   DVT prophylaxis: SCDs Start: 03/09/23 1615   Code Status: Full code Family Communication: Daughter at the bedside Disposition Plan: Status is: Inpatient Remains inpatient appropriate because: Inpatient surgery, will need SNF placement.     Consultants:  Orthopedics  Procedures:  ORIF left hip  Antimicrobials:   Perioperative   Subjective:  Patient seen and examined.  Denies any overnight events.  She had some discomfort on the left hip mostly on the buttocks. Patient had previously been at [facility] and she would like to go there for rehab.  Objective: Vitals:   03/09/23 2011 03/10/23 0012 03/10/23 0603 03/10/23 0822  BP: (!) 109/53 (!) 87/48 (!) 115/51 (!) 103/47  Pulse: (!) 58 68 (!) 59 (!) 56  Resp: 17 17 18 18   Temp: 97.6 F (36.4 C) 97.6 F (36.4 C) 98.1 F (36.7 C) 99 F (37.2 C)  TempSrc: Oral Oral Oral Oral  SpO2: 100% 98% 98% 98%  Weight:      Height:        Intake/Output Summary (Last 24 hours) at 03/10/2023 1102 Last data filed at 03/09/2023 1823 Gross per 24 hour  Intake 1509.54 ml  Output 250 ml  Net 1259.54 ml   Filed Weights   03/08/23 2235 03/09/23 1113  Weight: 68 kg 62 kg    Examination:  General: Looks comfortable.  Interactive.  Pleasant to conversation. Cardiovascular: S1-S2 normal.  Regular rate rhythm. Respiratory: Bilateral clear.  No added sounds. Gastrointestinal: Soft.  Nontender. Ext: No edema or swelling. Left lateral thigh incisions clean and dry.   Data Reviewed: I have personally reviewed following labs and imaging studies  CBC: Recent Labs  Lab 03/08/23 2234 03/08/23 2240 03/09/23 0412  WBC 11.1*  --  9.6  HGB 14.3 14.3 13.1  HCT 43.9 42.0 38.4  MCV 92.0  --  88.9  PLT 216  --  205   Basic Metabolic Panel: Recent Labs  Lab 03/08/23  2234 03/08/23 2240 03/09/23 0412  NA 139 142 140  K 3.8 3.8 3.6  CL 104 105 103  CO2 23  --  24  GLUCOSE 128* 125* 132*  BUN 26* 25* 27*  CREATININE 1.40* 1.40* 1.42*  CALCIUM 9.5  --  9.3   GFR: Estimated Creatinine Clearance: 25.6 mL/min (A) (by C-G formula based on SCr of 1.42 mg/dL (H)). Liver Function Tests: Recent Labs  Lab 03/08/23 2234  AST 46*  ALT 20  ALKPHOS 32*  BILITOT 1.6*  PROT 6.5  ALBUMIN 4.1   No results for input(s): "LIPASE", "AMYLASE" in the last 168  hours. No results for input(s): "AMMONIA" in the last 168 hours. Coagulation Profile: Recent Labs  Lab 03/08/23 2234  INR 1.1   Cardiac Enzymes: No results for input(s): "CKTOTAL", "CKMB", "CKMBINDEX", "TROPONINI" in the last 168 hours. BNP (last 3 results) No results for input(s): "PROBNP" in the last 8760 hours. HbA1C: No results for input(s): "HGBA1C" in the last 72 hours. CBG: No results for input(s): "GLUCAP" in the last 168 hours. Lipid Profile: No results for input(s): "CHOL", "HDL", "LDLCALC", "TRIG", "CHOLHDL", "LDLDIRECT" in the last 72 hours. Thyroid Function Tests: No results for input(s): "TSH", "T4TOTAL", "FREET4", "T3FREE", "THYROIDAB" in the last 72 hours. Anemia Panel: No results for input(s): "VITAMINB12", "FOLATE", "FERRITIN", "TIBC", "IRON", "RETICCTPCT" in the last 72 hours. Sepsis Labs: Recent Labs  Lab 03/08/23 2230  LATICACIDVEN 2.1*    No results found for this or any previous visit (from the past 240 hour(s)).       Radiology Studies: DG FEMUR MIN 2 VIEWS LEFT  Result Date: 03/09/2023 CLINICAL DATA:  Intramedullary nail intertrochanteric femur. Intraoperative fluoroscopy. EXAM: LEFT FEMUR 2 VIEWS COMPARISON:  Pelvis and left hip radiographs 03/08/2023 FINDINGS: Images were performed intraoperatively without the presence of a radiologist. The patient is undergoing cephalomedullary nail fixation of the previously seen intertrochanteric fracture. Markedly improved alignment. Total fluoroscopy images: 6 Total fluoroscopy time: 40 seconds Total dose: Radiation Exposure Index (as provided by the fluoroscopic device): 9.40 mGy air Kerma Please see intraoperative findings for further detail. IMPRESSION: Intraoperative fluoroscopy for cephalomedullary nail fixation of the left intertrochanteric fracture. Electronically Signed   By: Neita Garnetonald  Viola M.D.   On: 03/09/2023 15:21   DG C-Arm 1-60 Min-No Report  Result Date: 03/09/2023 Fluoroscopy was utilized by the  requesting physician.  No radiographic interpretation.   CT CERVICAL SPINE WO CONTRAST  Result Date: 03/08/2023 CLINICAL DATA:  Fall on blood thinners EXAM: CT HEAD WITHOUT CONTRAST CT CERVICAL SPINE WITHOUT CONTRAST TECHNIQUE: Multidetector CT imaging of the head and cervical spine was performed following the standard protocol without intravenous contrast. Multiplanar CT image reconstructions of the cervical spine were also generated. RADIATION DOSE REDUCTION: This exam was performed according to the departmental dose-optimization program which includes automated exposure control, adjustment of the mA and/or kV according to patient size and/or use of iterative reconstruction technique. COMPARISON:  03/15/2022 CT head, 08/17/2021 CT cervical spine FINDINGS: CT HEAD FINDINGS Brain: No evidence of acute infarct, hemorrhage, mass, mass effect, or midline shift. No hydrocephalus or extra-axial fluid collection. Periventricular white matter changes, likely the sequela of chronic small vessel ischemic disease. Vascular: No hyperdense vessel. Skull: Negative for fracture or focal lesion. Sinuses/Orbits: No acute finding. Other: The mastoid air cells are well aerated. CT CERVICAL SPINE FINDINGS Alignment: Straightening and mild reversal of the normal cervical lordosis, with trace stepwise anterolisthesis of C3 on C4, C4 on C5, and C5 on, unchanged. Skull base  and vertebrae: No acute fracture. No primary bone lesion or focal pathologic process. Soft tissues and spinal canal: No prevertebral fluid or swelling. No visible canal hematoma. Disc levels: Degenerative changes in the cervical spine. No significant spinal canal stenosis. Upper chest: Emphysema. No focal pulmonary opacity or pleural effusion. IMPRESSION: 1. No acute intracranial process. 2. No acute fracture or traumatic subluxation in the cervical spine. Electronically Signed   By: Wiliam KeAlison  Vasan M.D.   On: 03/08/2023 23:09   CT HEAD WO CONTRAST  Result Date:  03/08/2023 CLINICAL DATA:  Fall on blood thinners EXAM: CT HEAD WITHOUT CONTRAST CT CERVICAL SPINE WITHOUT CONTRAST TECHNIQUE: Multidetector CT imaging of the head and cervical spine was performed following the standard protocol without intravenous contrast. Multiplanar CT image reconstructions of the cervical spine were also generated. RADIATION DOSE REDUCTION: This exam was performed according to the departmental dose-optimization program which includes automated exposure control, adjustment of the mA and/or kV according to patient size and/or use of iterative reconstruction technique. COMPARISON:  03/15/2022 CT head, 08/17/2021 CT cervical spine FINDINGS: CT HEAD FINDINGS Brain: No evidence of acute infarct, hemorrhage, mass, mass effect, or midline shift. No hydrocephalus or extra-axial fluid collection. Periventricular white matter changes, likely the sequela of chronic small vessel ischemic disease. Vascular: No hyperdense vessel. Skull: Negative for fracture or focal lesion. Sinuses/Orbits: No acute finding. Other: The mastoid air cells are well aerated. CT CERVICAL SPINE FINDINGS Alignment: Straightening and mild reversal of the normal cervical lordosis, with trace stepwise anterolisthesis of C3 on C4, C4 on C5, and C5 on, unchanged. Skull base and vertebrae: No acute fracture. No primary bone lesion or focal pathologic process. Soft tissues and spinal canal: No prevertebral fluid or swelling. No visible canal hematoma. Disc levels: Degenerative changes in the cervical spine. No significant spinal canal stenosis. Upper chest: Emphysema. No focal pulmonary opacity or pleural effusion. IMPRESSION: 1. No acute intracranial process. 2. No acute fracture or traumatic subluxation in the cervical spine. Electronically Signed   By: Wiliam KeAlison  Vasan M.D.   On: 03/08/2023 23:09   DG Hip Unilat W or Wo Pelvis 2-3 Views Left  Result Date: 03/08/2023 CLINICAL DATA:  Fall. EXAM: DG HIP (WITH OR WITHOUT PELVIS) 2-3V LEFT  COMPARISON:  CT abdomen and pelvis 12/04/2019 FINDINGS: Acute comminuted displaced intertrochanteric fracture of the left femur. The lesser trochanter is mildly displaced medially and inferiorly. There is mild lateral displacement of the dominant fragment. The left femoral head remains located within the acetabulum. Surgical clips over the right hemipelvis. IMPRESSION: Acute comminuted displaced intertrochanteric fracture of the left femur. Electronically Signed   By: Minerva Festeryler  Stutzman M.D.   On: 03/08/2023 22:59   DG Chest Port 1 View  Result Date: 03/08/2023 CLINICAL DATA:  Patient reports lying on the floor because it was more comfortable. Trauma. Denies hitting head. She has not on Plavix. EXAM: PORTABLE CHEST 1 VIEW COMPARISON:  02/15/2022 FINDINGS: Unchanged cardiomediastinal silhouette. Aortic atherosclerotic calcification. No focal consolidation, pleural effusion, or pneumothorax. No displaced rib fractures. IMPRESSION: No active disease. Electronically Signed   By: Minerva Festeryler  Stutzman M.D.   On: 03/08/2023 22:57        Scheduled Meds:  acetaminophen  500 mg Oral Q6H   docusate sodium  100 mg Oral BID   feeding supplement  237 mL Oral BID BM   hydrALAZINE  50 mg Oral Q8H   isosorbide dinitrate  30 mg Oral Q8H   nebivolol  5 mg Oral Daily   pantoprazole  40  mg Oral Daily   pravastatin  80 mg Oral q1800   Continuous Infusions:  methocarbamol (ROBAXIN) IV       LOS: 2 days    Time spent: 35 minutes    Dorcas Carrow, MD [facility] Pager [phone]

## 2023-03-10 NOTE — Anesthesia Postprocedure Evaluation (Signed)
Anesthesia Post Note  Patient: Gina Bond  Procedure(s) Performed: INTRAMEDULLARY (IM) NAIL INTERTROCHANTERIC (Left: Hip)     Patient location during evaluation: PACU Anesthesia Type: General Level of consciousness: awake and alert Pain management: pain level controlled Vital Signs Assessment: post-procedure vital signs reviewed and stable Respiratory status: spontaneous breathing, nonlabored ventilation, respiratory function stable and patient connected to nasal cannula oxygen Cardiovascular status: blood pressure returned to baseline and stable Postop Assessment: no apparent nausea or vomiting Anesthetic complications: no   No notable events documented.  Last Vitals:  Vitals:   03/10/23 1712 03/10/23 1944  BP: (!) 127/58 (!) 122/50  Pulse: 64 63  Resp: 18 18  Temp: 37.4 C (!) 36.2 C  SpO2: 97% 97%    Last Pain:  Vitals:   03/10/23 1944  TempSrc: Oral  PainSc:                  Nelle Don Mont Jagoda
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
## 2023-03-10 NOTE — Progress Notes (Signed)
    Subjective: Patient reports pain as mild to moderate.  Tolerating diet.  Urinating.   No CP, SOB.  Hasn't mobilized much OOB yet.  Objective:   VITALS:   Vitals:   03/10/23 0822 03/10/23 1100 03/10/23 1701 03/10/23 1712  BP: (!) 103/47 (!) 120/45 121/60 (!) 127/58  Pulse: (!) 56 (!) 59 65 64  Resp: 18   18  Temp: 99 F (37.2 C)   99.3 F (37.4 C)  TempSrc: Oral   Oral  SpO2: 98%   97%  Weight:      Height:          Latest Ref Rng & Units 03/10/2023    2:28 PM 03/09/2023    4:12 AM 03/08/2023   10:40 PM  CBC  WBC 4.0 - 10.5 K/uL 8.0  9.6    Hemoglobin 12.0 - 15.0 g/dL 8.0  41.5  83.0   Hematocrit 36.0 - 46.0 % 23.9  38.4  42.0   Platelets 150 - 400 K/uL 147  205        Latest Ref Rng & Units 03/10/2023    2:28 PM 03/09/2023    4:12 AM 03/08/2023   10:40 PM  BMP  Glucose 70 - 99 mg/dL 940  768  088   BUN 8 - 23 mg/dL 33  27  25   Creatinine 0.44 - 1.00 mg/dL 1.10  3.15  9.45   Sodium 135 - 145 mmol/L 137  140  142   Potassium 3.5 - 5.1 mmol/L 3.5  3.6  3.8   Chloride 98 - 111 mmol/L 104  103  105   CO2 22 - 32 mmol/L 24  24    Calcium 8.9 - 10.3 mg/dL 8.2  9.3     Intake/Output      04/04 0701 04/05 0700 04/05 0701 04/06 0700   P.O. 113    I.V. (mL/kg) 796.5 (12.8)    IV Piggyback 600    Total Intake(mL/kg) 1509.5 (24.3)    Urine (mL/kg/hr)  250 (0.4)   Blood 250    Total Output 250 250   Net +1259.5 -250        Urine Occurrence 1 x        Assessment: 1 Day Post-Op  S/P Procedure(s) (LRB): INTRAMEDULLARY (IM) NAIL INTERTROCHANTERIC (Left) by Dr. Jewel Baize. Eulah Pont on 03/09/23  Principal Problem:   Closed left hip fracture, initial encounter Active Problems:   COPD (chronic obstructive pulmonary disease)   Essential hypertension   Chronic kidney disease, stage 3b   Coronary artery disease involving native coronary artery of native heart without angina pectoris   Abdominal aortic aneurysm   Memory loss   Plan: Watch Hgb  Advance diet Up with  therapy Incentive Spirometry Elevate and Apply ice  Weightbearing: WBAT LLE Insicional and dressing care: Dressings left intact until follow-up and Reinforce dressings as needed. She was oozy in surgery so dressings may need to be changed tomorrow. Orthopedic device(s): None Showering: Keep dressing dry VTE prophylaxis:  restart Plavix home dose, if Hgb drops below 7 then hold and give blood  , SCDs, ambulation Pain control: minimize narcotics as able due to age and dementia Follow - up plan: 2 weeks Contact information:  Margarita Rana MD, Levester Fresh PA-C  Dispo:  TBD based on PT evaluation. Will likely need SNF since lives alone.      Jenne Pane, PA-C Office [phone] 03/10/2023, 5:46 PM

## 2023-03-10 NOTE — Care Management Important Message (Signed)
Important Message  Patient Details  Name: Gina Bond MRN: [id number] Date of Birth: 1932/10/06   Medicare Important Message Given:  Yes     Dorena Bodo 03/10/2023, 3:08 PM

## 2023-03-11 DIAGNOSIS — S72002A Fracture of unspecified part of neck of left femur, initial encounter for closed fracture: Secondary | ICD-10-CM | POA: Diagnosis not present

## 2023-03-11 LAB — BASIC METABOLIC PANEL
Anion gap: 9 (ref 5–15)
BUN: 33 mg/dL — ABNORMAL HIGH (ref 8–23)
CO2: 26 mmol/L (ref 22–32)
Calcium: 8.5 mg/dL — ABNORMAL LOW (ref 8.9–10.3)
Chloride: 101 mmol/L (ref 98–111)
Creatinine, Ser: 1.49 mg/dL — ABNORMAL HIGH (ref 0.44–1.00)
GFR, Estimated: 34 mL/min — ABNORMAL LOW (ref >60)
Glucose, Bld: 113 mg/dL — ABNORMAL HIGH (ref 70–99)
Potassium: 3.7 mmol/L (ref 3.5–5.1)
Sodium: 136 mmol/L (ref 135–145)

## 2023-03-11 LAB — CBC
HCT: 21.2 % — ABNORMAL LOW (ref 36.0–46.0)
Hemoglobin: 7.2 g/dL — ABNORMAL LOW (ref 12.0–15.0)
MCH: 30.8 pg (ref 26.0–34.0)
MCHC: 34 g/dL (ref 30.0–36.0)
MCV: 90.6 fL (ref 80.0–100.0)
Platelets: 144 10*3/uL — ABNORMAL LOW (ref 150–400)
RBC: 2.34 MIL/uL — ABNORMAL LOW (ref 3.87–5.11)
RDW: 13.6 % (ref 11.5–15.5)
WBC: 6.7 10*3/uL (ref 4.0–10.5)
nRBC: 0 % (ref 0.0–0.2)

## 2023-03-11 LAB — BPAM RBC: Blood Product Expiration Date: 202404112359

## 2023-03-11 LAB — ABO/RH: ABO/RH(D): O NEG

## 2023-03-11 LAB — TYPE AND SCREEN

## 2023-03-11 LAB — HEMOGLOBIN AND HEMATOCRIT, BLOOD
HCT: 26.3 % — ABNORMAL LOW (ref 36.0–46.0)
Hemoglobin: 8.9 g/dL — ABNORMAL LOW (ref 12.0–15.0)

## 2023-03-11 LAB — PREPARE RBC (CROSSMATCH)

## 2023-03-11 MED ORDER — SODIUM CHLORIDE 0.9% IV SOLUTION: Freq: Once | INTRAVENOUS | Status: AC

## 2023-03-11 NOTE — Progress Notes (Signed)
Subjective: Patient reports pain as marked.  Tolerating diet.  Urinating.   No CP, SOB.  Hasn't mobilized much OOB yet with PT d/t pain.   Drop in Hgb today so getting blood transfusion.  Objective:   VITALS:   Vitals:   03/11/23 0436 03/11/23 0750 03/11/23 1129 03/11/23 1207  BP: (!) 99/48 (!) 100/51 (!) 122/51 (!) 124/54  Pulse: (!) 55 (!) 52 (!) 56 (!) 59  Resp: 18 18 18 18   Temp: 98.1 F (36.7 C) 97.7 F (36.5 C) 98.2 F (36.8 C) 97.9 F (36.6 C)  TempSrc: Oral Oral Oral Oral  SpO2: 93% 98% 97% 97%  Weight:      Height:          Latest Ref Rng & Units 03/11/2023    3:11 AM 03/10/2023    2:28 PM 03/09/2023    4:12 AM  CBC  WBC 4.0 - 10.5 K/uL 6.7  8.0  9.6   Hemoglobin 12.0 - 15.0 g/dL 7.2  8.0  16.1   Hematocrit 36.0 - 46.0 % 21.2  23.9  38.4   Platelets 150 - 400 K/uL 144  147  205       Latest Ref Rng & Units 03/11/2023    3:11 AM 03/10/2023    2:28 PM 03/09/2023    4:12 AM  BMP  Glucose 70 - 99 mg/dL 096  045  409   BUN 8 - 23 mg/dL 33  33  27   Creatinine 0.44 - 1.00 mg/dL 8.11  9.14  7.82   Sodium 135 - 145 mmol/L 136  137  140   Potassium 3.5 - 5.1 mmol/L 3.7  3.5  3.6   Chloride 98 - 111 mmol/L 101  104  103   CO2 22 - 32 mmol/L 26  24  24    Calcium 8.9 - 10.3 mg/dL 8.5  8.2  9.3    Intake/Output      04/05 0701 04/06 0700 04/06 0701 04/07 0700   P.O. 200    I.V. (mL/kg)     IV Piggyback     Total Intake(mL/kg) 200 (3.2)    Urine (mL/kg/hr) 400 (0.3)    Blood     Total Output 400    Net -200         Urine Occurrence 1 x       Physical Exam: General: NAD.  Sitting up in bed, calm, comfortable Resp: No increased wob Cardio: regular rate and rhythm ABD soft Neurologically intact MSK Neurovascularly intact Sensation intact distally Intact pulses distally Very reluctant to actively move the entire L leg Dorsiflexion/Plantar flexion intact Passively let me flex her L knee to about 40 degrees Incision: dressings C/D/I    Assessment: 2  Days Post-Op  S/P Procedure(s) (LRB): INTRAMEDULLARY (IM) NAIL INTERTROCHANTERIC (Left) by Dr. Jewel Baize. Eulah Pont on 03/09/23  Principal Problem:   Closed left hip fracture, initial encounter Active Problems:   COPD (chronic obstructive pulmonary disease)   Essential hypertension   Chronic kidney disease, stage 3b   Coronary artery disease involving native coronary artery of native heart without angina pectoris   Abdominal aortic aneurysm   Memory loss   Plan: Watch Hgb. She was oozy in surgery so not surprised she needed blood Advance diet Up with therapy Incentive Spirometry Elevate and Apply ice  Weightbearing: WBAT LLE Insicional and dressing care: Dressings left intact until follow-up and Reinforce dressings as needed. Orthopedic device(s): None Showering: Keep dressing dry VTE prophylaxis:  restart Plavix home dose once Hgb remains >8, can also do ASA 81mg  bid x 30 days upon d/c  , SCDs, ambulation Pain control: minimize narcotics as able due to age and dementia Follow - up plan: 2 weeks Contact information:  Margarita Rana MD, Levester Fresh PA-C  Dispo:  PT recommending SNF. Came from [facility]. Hopeful to return there to the rehab area. Ok for d/c from ortho standpoint once medically ready.     Jenne Pane, PA-C Office [phone] 03/11/2023, 1:19 PM

## 2023-03-11 NOTE — Plan of Care (Signed)
Patient Gina Bond, disoriented to time and forgetful at baseline with delayed responses.  VSS throughout shift.  All meds given on time as ordered.  Pt c/o left hip pain relieved by PRN norco.  Diminished lungs, IS encouraged.  Purewick in place.  POC maintained, will continue to monitor.  Problem: Education: Goal: Knowledge of General Education information will improve Description: Including pain rating scale, medication(s)/side effects and non-pharmacologic comfort measures Outcome: Progressing   Problem: Health Behavior/Discharge Planning: Goal: Ability to manage health-related needs will improve Outcome: Progressing   Problem: Clinical Measurements: Goal: Ability to maintain clinical measurements within normal limits will improve Outcome: Progressing Goal: Will remain free from infection Outcome: Progressing Goal: Diagnostic test results will improve Outcome: Progressing Goal: Respiratory complications will improve Outcome: Progressing Goal: Cardiovascular complication will be avoided Outcome: Progressing   Problem: Activity: Goal: Risk for activity intolerance will decrease Outcome: Progressing   Problem: Nutrition: Goal: Adequate nutrition will be maintained Outcome: Progressing   Problem: Coping: Goal: Level of anxiety will decrease Outcome: Progressing   Problem: Elimination: Goal: Will not experience complications related to bowel motility Outcome: Progressing Goal: Will not experience complications related to urinary retention Outcome: Progressing   Problem: Pain Managment: Goal: General experience of comfort will improve Outcome: Progressing   Problem: Safety: Goal: Ability to remain free from injury will improve Outcome: Progressing   Problem: Skin Integrity: Goal: Risk for impaired skin integrity will decrease Outcome: Progressing   Problem: Education: Goal: Verbalization of understanding the information provided (i.e., activity precautions, restrictions,  etc) will improve Outcome: Progressing Goal: Individualized Educational Video(s) Outcome: Progressing   Problem: Activity: Goal: Ability to ambulate and perform ADLs will improve Outcome: Progressing   Problem: Clinical Measurements: Goal: Postoperative complications will be avoided or minimized Outcome: Progressing   Problem: Self-Concept: Goal: Ability to maintain and perform role responsibilities to the fullest extent possible will improve Outcome: Progressing   Problem: Pain Management: Goal: Pain level will decrease Outcome: Progressing

## 2023-03-11 NOTE — Progress Notes (Signed)
PROGRESS NOTE    Helenann Vanbergen  JZP:[id number] DOB: May 06, 1932 DOA: 03/08/2023 PCP: Verlon Au, MD    Brief Narrative:  89 year old female with history of hypertension, hyperlipidemia, coronary artery disease, CKD stage IIIb and memory loss presented to the emergency department with left hip pain after unwitnessed fall.  She lives independent.  Her daughter was unable to reach her so went to check on her, found her on the bedroom floor and complaining of severe left hip pain.  Patient does not remember falling.  In the emergency room afebrile.  On room air.  EKG with incomplete right bundle branch block.  Chest x-ray negative.  Skeletal survey including CT scan of the head and CT cervical spine was essentially normal.  X-ray of the hip consistent with left intertrochanteric femur fracture.  Admitted with surgical consultation.   Assessment & Plan:   Closed traumatic left hip fracture: Status post ORIF with IM nail intertrochanteric fracture, Dr. Eulah Pont.  Postop day 2. Weightbearing as tolerated left lower extremity. Lovenox for DVT prophylaxis for 4 weeks.  On hold today for drop in hemoglobin. Pain managed with oral pain medications along with bowel regimen. Work with PT OT. Refer to a SNF.  Anemia of acute blood loss: Anticipated from long bone fracture, hematoma and surgery along with hemodilution. Baseline hemoglobin 13-8-7.2.  Patient has multiple medical problems and will benefit with keeping hemoglobin more than 8 with given history of coronary artery disease. Transfuse 1 unit of PRBC today.  No evidence of ongoing bleeding.  Recheck tomorrow morning. Will hold Lovenox and Plavix today, however there is no evidence of active bleeding.  Should be able to go back on this tomorrow.  Chronic medical issues including Coronary artery disease, stable.  Continued on nitrates and pravastatin.  Plavix on hold.  Will resume tomorrow. AAA, stable. Essential hypertension,  blood pressure elevated in the ER.  Now controlled.  On hydralazine nebivolol. Resumed all home medicine. CKD stage IIIb, at about her baseline.   COPD, not in exacerbation.  Albuterol as needed.  Patient consented to blood transfusion after discussion about benefits and side effects.   DVT prophylaxis: SCDs Start: 03/09/23 1615   Code Status: Full code Family Communication: None today. Disposition Plan: Status is: Inpatient Remains inpatient appropriate because: Blood transfusions, SNF placement     Consultants:  Orthopedics  Procedures:  ORIF left hip  Antimicrobials:  Perioperative   Subjective:  Patient seen and examined.  No overnight events.  Alert awake and interactive.  "If my left hip does not hurt that should be questionable".  Pain is managed.  Appetite is fair.  Bowel movement without any evidence of bleeding. Consented for blood transfusion.  Objective: Vitals:   03/10/23 1712 03/10/23 1944 03/11/23 0436 03/11/23 0750  BP: (!) 127/58 (!) 122/50 (!) 99/48 (!) 100/51  Pulse: 64 63 (!) 55 (!) 52  Resp: 18 18 18 18   Temp: 99.3 F (37.4 C) (!) 97.2 F (36.2 C) 98.1 F (36.7 C) 97.7 F (36.5 C)  TempSrc: Oral Oral Oral Oral  SpO2: 97% 97% 93% 98%  Weight:      Height:        Intake/Output Summary (Last 24 hours) at 03/11/2023 1106 Last data filed at 03/10/2023 2100 Gross per 24 hour  Intake 200 ml  Output 400 ml  Net -200 ml    Filed Weights   03/08/23 2235 03/09/23 1113  Weight: 68 kg 62 kg    Examination:  General: Looks  comfortable.  Interactive and pleasant to conversation. Cardiovascular: S1-S2 normal.  Regular rate rhythm. Respiratory: Bilateral clear.  No added sounds. Gastrointestinal: Soft.  Nontender. Ext: No edema or swelling. Left lateral thigh incisions clean and dry. There is no evidence of localized swelling, hematoma or bleeding.   Data Reviewed: I have personally reviewed following labs and imaging studies  CBC: Recent  Labs  Lab 03/08/23 2234 03/08/23 2240 03/09/23 0412 03/10/23 1428 03/11/23 0311  WBC 11.1*  --  9.6 8.0 6.7  HGB 14.3 14.3 13.1 8.0* 7.2*  HCT 43.9 42.0 38.4 23.9* 21.2*  MCV 92.0  --  88.9 90.5 90.6  PLT 216  --  205 147* 144*    Basic Metabolic Panel: Recent Labs  Lab 03/08/23 2234 03/08/23 2240 03/09/23 0412 03/10/23 1428 03/11/23 0311  NA 139 142 140 137 136  K 3.8 3.8 3.6 3.5 3.7  CL 104 105 103 104 101  CO2 23  --  24 24 26   GLUCOSE 128* 125* 132* 143* 113*  BUN 26* 25* 27* 33* 33*  CREATININE 1.40* 1.40* 1.42* 1.54* 1.49*  CALCIUM 9.5  --  9.3 8.2* 8.5*    GFR: Estimated Creatinine Clearance: 24.4 mL/min (A) (by C-G formula based on SCr of 1.49 mg/dL (H)). Liver Function Tests: Recent Labs  Lab 03/08/23 2234  AST 46*  ALT 20  ALKPHOS 32*  BILITOT 1.6*  PROT 6.5  ALBUMIN 4.1    No results for input(s): "LIPASE", "AMYLASE" in the last 168 hours. No results for input(s): "AMMONIA" in the last 168 hours. Coagulation Profile: Recent Labs  Lab 03/08/23 2234  INR 1.1    Cardiac Enzymes: No results for input(s): "CKTOTAL", "CKMB", "CKMBINDEX", "TROPONINI" in the last 168 hours. BNP (last 3 results) No results for input(s): "PROBNP" in the last 8760 hours. HbA1C: No results for input(s): "HGBA1C" in the last 72 hours. CBG: No results for input(s): "GLUCAP" in the last 168 hours. Lipid Profile: No results for input(s): "CHOL", "HDL", "LDLCALC", "TRIG", "CHOLHDL", "LDLDIRECT" in the last 72 hours. Thyroid Function Tests: No results for input(s): "TSH", "T4TOTAL", "FREET4", "T3FREE", "THYROIDAB" in the last 72 hours. Anemia Panel: No results for input(s): "VITAMINB12", "FOLATE", "FERRITIN", "TIBC", "IRON", "RETICCTPCT" in the last 72 hours. Sepsis Labs: Recent Labs  Lab 03/08/23 2230  LATICACIDVEN 2.1*     No results found for this or any previous visit (from the past 240 hour(s)).       Radiology Studies: DG FEMUR MIN 2 VIEWS  LEFT  Result Date: 03/09/2023 CLINICAL DATA:  Intramedullary nail intertrochanteric femur. Intraoperative fluoroscopy. EXAM: LEFT FEMUR 2 VIEWS COMPARISON:  Pelvis and left hip radiographs 03/08/2023 FINDINGS: Images were performed intraoperatively without the presence of a radiologist. The patient is undergoing cephalomedullary nail fixation of the previously seen intertrochanteric fracture. Markedly improved alignment. Total fluoroscopy images: 6 Total fluoroscopy time: 40 seconds Total dose: Radiation Exposure Index (as provided by the fluoroscopic device): 9.40 mGy air Kerma Please see intraoperative findings for further detail. IMPRESSION: Intraoperative fluoroscopy for cephalomedullary nail fixation of the left intertrochanteric fracture. Electronically Signed   By: Neita Garnetonald  Viola M.D.   On: 03/09/2023 15:21   DG C-Arm 1-60 Min-No Report  Result Date: 03/09/2023 Fluoroscopy was utilized by the requesting physician.  No radiographic interpretation.        Scheduled Meds:  sodium chloride   Intravenous Once   acetaminophen  500 mg Oral Q6H   docusate sodium  100 mg Oral BID   feeding supplement  237  mL Oral BID BM   hydrALAZINE  50 mg Oral Q8H   isosorbide dinitrate  30 mg Oral Q8H   nebivolol  5 mg Oral Daily   pantoprazole  40 mg Oral Daily   pravastatin  80 mg Oral q1800   Continuous Infusions:  methocarbamol (ROBAXIN) IV       LOS: 3 days    Time spent: 35 minutes    Dorcas Carrow, MD [facility] Pager [phone]

## 2023-03-12 ENCOUNTER — Encounter (HOSPITAL_COMMUNITY): Payer: Self-pay | Admitting: Orthopedic Surgery

## 2023-03-12 DIAGNOSIS — S72002A Fracture of unspecified part of neck of left femur, initial encounter for closed fracture: Secondary | ICD-10-CM | POA: Diagnosis not present

## 2023-03-12 DIAGNOSIS — W19XXXA Unspecified fall, initial encounter: Secondary | ICD-10-CM

## 2023-03-12 LAB — TYPE AND SCREEN
ABO/RH(D): O NEG
Antibody Screen: NEGATIVE
Unit division: 0

## 2023-03-12 LAB — BPAM RBC
ISSUE DATE / TIME: 202404061145
Unit Type and Rh: 9500

## 2023-03-12 MED ORDER — HYDRALAZINE HCL 20 MG/ML IJ SOLN
10.0000 mg | INTRAMUSCULAR | Status: DC | PRN
  Administered 2023-03-12: 10 mg via INTRAVENOUS
  Filled 2023-03-12: qty 1

## 2023-03-12 MED ORDER — HALOPERIDOL LACTATE 5 MG/ML IJ SOLN: 2.0000 mg | Freq: Four times a day (QID) | INTRAMUSCULAR | Status: DC | PRN

## 2023-03-12 NOTE — TOC Transition Note (Signed)
Transition of Care [facility]) - CM/SW Discharge Note   Patient Details  Name: Gina Bond MRN: [id number] Date of Birth: October 27, 1932  Transition of [facility]) CM/SW Contact:  Eduard Roux, LCSW Phone Number: 03/12/2023, 10:33 AM   Clinical Narrative:      CSW spoke with patient's daughter,[name]. CSW introduced self and explained role. She confirmed patient lives home alone. CSW discussed therapy recommendations for short term rehab at [facility]. She is agreeable to SNF. Preferred SNF is [facility], she has been there before. CW was given permission to send referrals to other SNFs as back up. All questions answered.   TOC will provide bed offers once available.   Antony Blackbird, MSW, LCSW Clinical Social Worker     Barriers to Discharge: Continued Medical Work up, English as a second language teacher, SNF Pending bed offer   Patient Goals and CMS Choice      Discharge Placement                         Discharge Plan and Services Additional resources added to the After Visit Summary for   In-house Referral: Clinical Social Work                                   Social Determinants of Health (SDOH) Interventions SDOH Screenings   Food Insecurity: No Food Insecurity (03/09/2023)  Housing: Low Risk  (03/09/2023)  Transportation Needs: No Transportation Needs (03/09/2023)  Utilities: Not At Risk (03/09/2023)  Tobacco Use: Medium Risk (03/09/2023)     Readmission Risk Interventions    08/20/2021    1:02 PM  Readmission Risk Prevention Plan  Transportation Screening Complete  PCP or Specialist Appt within 5-7 Days Complete  Home Care Screening Complete  Medication Review (RN CM) Complete

## 2023-03-12 NOTE — Hospital Course (Signed)
89 year old female with history of hypertension, hyperlipidemia, coronary artery disease, CKD stage IIIb and memory loss presented to the emergency department with left hip pain after unwitnessed fall. She lives independent. Her daughter was unable to reach her so went to check on her, found her on the bedroom floor and complaining of severe left hip pain. Patient does not remember falling. In the emergency room afebrile. On room air. EKG with incomplete right bundle branch block. Chest x-ray negative. Skeletal survey including CT scan of the head and CT cervical spine was essentially normal. X-ray of the hip consistent with left intertrochanteric femur fracture. Admitted with surgical consultation.

## 2023-03-12 NOTE — Progress Notes (Signed)
Mobility Specialist: Progress Note   03/12/23 1355  Mobility  Activity Ambulated with assistance in room  Level of Assistance +2 (takes two people)  Press photographer wheel walker  Distance Ambulated (ft) 6 ft (3'+3')  Activity Response Tolerated fair  Mobility Referral Yes  $Mobility charge 1 Mobility   Pt received in the bed and agreeable to mobility. ModA with bed mobility and minA to stand. Verbal and tactile cues for RW management and direction, upright posture, and BLE advancement. C/o LLE pain as well as SOB. 2 L/min Warren placed on pt for comfort with RN present for session. Stopped x1 d/t needing to use BSC, then assisted back to bed. Pt back in the bed with call bell at her side. Bed alarm is on.   Gina Bond Mobility Specialist Please contact via SecureChat or Rehab office at [phone]

## 2023-03-12 NOTE — Progress Notes (Signed)
  Progress Note   Patient: Gina Bond WCB:[id number] DOB: 1932/07/13 DOA: 03/08/2023     4 DOS: the patient was seen and examined on 03/12/2023   Brief hospital course: 89 year old female with history of hypertension, hyperlipidemia, coronary artery disease, CKD stage IIIb and memory loss presented to the emergency department with left hip pain after unwitnessed fall. She lives independent. Her daughter was unable to reach her so went to check on her, found her on the bedroom floor and complaining of severe left hip pain. Patient does not remember falling. In the emergency room afebrile. On room air. EKG with incomplete right bundle branch block. Chest x-ray negative. Skeletal survey including CT scan of the head and CT cervical spine was essentially normal. X-ray of the hip consistent with left intertrochanteric femur fracture. Admitted with surgical consultation.   Assessment and Plan: Closed traumatic left hip fracture: Status post ORIF with IM nail intertrochanteric fracture, Dr. Eulah Pont Weightbearing as tolerated left lower extremity. Per Ortho, restart plavix for DVT prophylaxis Pain managed with oral pain medications along with bowel regimen. Therapy recs for SNF, consulted TOC   Anemia of acute blood loss: Anticipated from long bone fracture, hematoma and surgery along with hemodilution. Baseline hemoglobin 13-8-7.2.  Patient has multiple medical problems and will benefit with keeping hemoglobin more than 8 with given history of coronary artery disease. Transfused 1 unit of PRBC 4/6.  Post-transfusion hgb stable Restart Plavix today   Chronic medical issues including Coronary artery disease, stable.  Continued on nitrates and pravastatin.  -resume plavix  AAA, stable.  Essential hypertension, blood pressure elevated in the ER.  On hydralazine nebivolol.If bp remains elevated, would resume verapamil  CKD stage IIIb, at about her baseline.    COPD, not in exacerbation.   Albuterol as needed.       Subjective: Pleasantly confused this AM  Physical Exam: Vitals:   03/11/23 1547 03/11/23 2039 03/12/23 0600 03/12/23 0749  BP: (!) 131/59 (!) 122/54 (!) 144/80 (!) 195/74  Pulse: (!) 58 (!) 57 62 60  Resp: 18 17 18 18   Temp: (!) 97.4 F (36.3 C) 98.3 F (36.8 C) 98.3 F (36.8 C) 99 F (37.2 C)  TempSrc: Oral  Oral Oral  SpO2: 95% 98%  93%  Weight:      Height:       General exam: Awake, laying in bed, in nad Respiratory system: Normal respiratory effort, no wheezing Cardiovascular system: regular rate, s1, s2 Gastrointestinal system: Soft, nondistended, positive BS Central nervous system: CN2-12 grossly intact, strength intact Extremities: Perfused, no clubbing Skin: Normal skin turgor, no notable skin lesions seen Psychiatry: difficult to assess, pleasantly confused  Data Reviewed:  There are no new results to review at this time.  Family Communication: Pt in room, family not at bedside  Disposition: Status is: Inpatient Remains inpatient appropriate because: Severity of illness  Planned Discharge Destination: Skilled nursing facility    Author: Rickey Barbara, MD 03/12/2023 9:33 AM  For on call review [email or web address].

## 2023-03-12 NOTE — [place] FL2 (Signed)
[place] MEDICAID FL2 LEVEL OF CARE FORM     IDENTIFICATION  Patient Name: Gina Bond Birthdate: October 04, 1932 Sex: female Admission Date (Current Location): 03/08/2023  [facility] and [place] Number:  Producer, television/film/video and Address:  [facility] [place], [address] [place], [place] [address]      Provider Number: [id number]  Attending Physician Name and Address:  Jerald Kief, MD  Relative Name and Phone Number:       Current Level of Care: Hospital Recommended Level of Care: Skilled Nursing Facility Prior Approval Number:    Date Approved/Denied:   PASRR Number: [id number]  Discharge Plan: SNF    Current Diagnoses: Patient Active Problem List   Diagnosis Date Noted   Memory loss 03/09/2023   Closed left hip fracture, initial encounter 03/08/2023   Non-ST elevation (NSTEMI) myocardial infarction    Acute ST elevation myocardial infarction (STEMI) of anteroseptal wall 02/15/2022   Unstable angina pectoris 02/15/2022   GERD (gastroesophageal reflux disease) 02/02/2022   Depression 02/02/2022   Chest pain, rule out acute myocardial infarction 02/01/2022   Fall at home, initial encounter 08/17/2021   Pulmonary hypertension with chronic cor pulmonale 08/17/2021   Closed fracture of anatomical neck of humerus, right, initial encounter 08/17/2021   Unspecified fracture of the lower end of left radius, initial encounter for closed fracture 08/17/2021   Closed fracture of left tibial plateau 08/17/2021   Closed fracture of medial epicondyle of right humerus 08/17/2021   Basal cell carcinoma (BCC) of skin of nose 07/07/2020   Facial swelling 07/07/2020   Abdominal aortic aneurysm 09/04/2018   Chronic constipation 06/15/2017   Diarrhea 03/17/2017   Right hip pain 05/26/2016   Hypoxia    Enteritis 12/06/2015   Dehydration 12/06/2015   Coronary artery disease involving native coronary artery of native heart without angina pectoris 12/06/2015    SBO (small bowel obstruction) (HCC)    Renal failure (ARF), acute on chronic    Back pain    Dyspnea    Small bowel obstruction (HCC)    Partial small bowel obstruction 07/24/2015   Ileitis 07/24/2015   Chronic kidney disease, stage 3b 07/24/2015   Breast pain, left 04/13/2015   Essential hypertension 12/10/2014   COPD exacerbation 12/10/2014   Unstable angina 12/09/2014   Cramp of both lower extremities 08/18/2014   Mixed hyperlipidemia 03/10/2014   Pain in joint, pelvic region and thigh 03/10/2014   COPD (chronic obstructive pulmonary disease) 09/13/2013   Osteoarthrosis 08/09/2013   Disorder of kidney and ureter 07/16/2013   Thoracic or lumbosacral neuritis or radiculitis 07/16/2013    Orientation RESPIRATION BLADDER Height & Weight     Self  Normal Incontinent Weight: 136 lb 12.4 oz (62 kg) Height:  5\' 6"  (167.6 cm)  BEHAVIORAL SYMPTOMS/MOOD NEUROLOGICAL BOWEL NUTRITION STATUS      Continent Diet (please see discharge summary)  AMBULATORY STATUS COMMUNICATION OF NEEDS Skin   Extensive Assist Verbally Surgical wounds (closed incision left hip, small blisters on right hip)                       Personal Care Assistance Level of Assistance  Bathing, Feeding, Dressing Bathing Assistance: Maximum assistance Feeding assistance: Independent Dressing Assistance: Maximum assistance     Functional Limitations Info  Sight, Hearing, Speech Sight Info: Impaired Hearing Info: Impaired Speech Info: Adequate    SPECIAL CARE FACTORS FREQUENCY  PT (By licensed PT), OT (By licensed OT)  PT Frequency: 5x per week OT Frequency: 5x per week            Contractures Contractures Info: Not present    Additional Factors Info  Code Status, Allergies Code Status Info: FULL Allergies Info: Benicar,fenofibrate,sulfa antibiotics,Sulfacetamide Sodium,Atorvastatin,magnesium,ondasetron,Rosuvastatin Calcium,Spironolactone,statins,sulfamethoxazole,tobramycin            Current Medications (03/12/2023):  This is the current hospital active medication list Current Facility-Administered Medications  Medication Dose Route Frequency Provider Last Rate Last Admin   acetaminophen (TYLENOL) tablet 325-650 mg  325-650 mg Oral Q6H PRN Gawne, Meghan M, PA-C       acetaminophen (TYLENOL) tablet 500 mg  500 mg Oral Q6H Levester FreshGawne, Meghan M, PA-C   500 mg at 03/12/23 [id number]   albuterol (PROVENTIL) (2.5 MG/3ML) 0.083% nebulizer solution 2.5 mg  2.5 mg Inhalation Q4H PRN Gawne, Meghan M, PA-C       bisacodyl (DULCOLAX) suppository 10 mg  10 mg Rectal Daily PRN Gawne, Meghan M, PA-C       docusate sodium (COLACE) capsule 100 mg  100 mg Oral BID Gawne, Meghan M, PA-C   100 mg at 03/12/23 0932   feeding supplement (ENSURE ENLIVE / ENSURE PLUS) liquid 237 mL  237 mL Oral BID BM Dorcas CarrowGhimire, Kuber, MD   237 mL at 03/12/23 0931   hydrALAZINE (APRESOLINE) injection 10 mg  10 mg Intravenous Q4H PRN Jerald Kiefhiu, Stephen K, MD       hydrALAZINE (APRESOLINE) tablet 50 mg  50 mg Oral Q8H Levester FreshGawne, Meghan M, PA-C   50 mg at 03/12/23 [id number]   HYDROcodone-acetaminophen (NORCO) 7.5-325 MG per tablet 1-2 tablet  1-2 tablet Oral Q4H PRN Levester FreshGawne, Meghan M, PA-C       HYDROcodone-acetaminophen (NORCO/VICODIN) 5-325 MG per tablet 1-2 tablet  1-2 tablet Oral Q4H PRN Jenne PaneGawne, Meghan M, PA-C   2 tablet at 03/11/23 2142   isosorbide dinitrate (ISORDIL) tablet 30 mg  30 mg Oral Q8H Levester FreshGawne, Meghan M, PA-C   30 mg at 03/12/23 [id number]   labetalol (NORMODYNE) injection 10 mg  10 mg Intravenous Q4H PRN Gawne, Meghan M, PA-C       menthol-cetylpyridinium (CEPACOL) lozenge 3 mg  1 lozenge Oral PRN Gawne, Meghan M, PA-C       Or   phenol (CHLORASEPTIC) mouth spray 1 spray  1 spray Mouth/Throat PRN Gawne, Lindie SpruceMeghan M, PA-C       methocarbamol (ROBAXIN) tablet 500 mg  500 mg Oral Q6H PRN Levester FreshGawne, Meghan M, PA-C   500 mg at 03/10/23 2129   Or   methocarbamol (ROBAXIN) 500 mg in dextrose 5 % 50 mL IVPB  500 mg Intravenous Q6H PRN Gawne, Meghan M,  PA-C       metoCLOPramide (REGLAN) tablet 5-10 mg  5-10 mg Oral Q8H PRN Gawne, Meghan M, PA-C       Or   metoCLOPramide (REGLAN) injection 5-10 mg  5-10 mg Intravenous Q8H PRN Gawne, Meghan M, PA-C       morphine (PF) 2 MG/ML injection 0.5-1 mg  0.5-1 mg Intravenous Q2H PRN Gawne, Meghan M, PA-C       nebivolol (BYSTOLIC) tablet 5 mg  5 mg Oral Daily Levester FreshGawne, Meghan M, PA-C   5 mg at 03/12/23 0932   ondansetron (ZOFRAN) tablet 4 mg  4 mg Oral Q6H PRN Levester FreshGawne, Meghan M, PA-C       Or   ondansetron (ZOFRAN) injection 4 mg  4 mg Intravenous Q6H PRN Jenne PaneGawne, Meghan M, PA-C  pantoprazole (PROTONIX) EC tablet 40 mg  40 mg Oral Daily Levester Fresh M, PA-C   40 mg at 03/12/23 0932   polyethylene glycol (MIRALAX / GLYCOLAX) packet 17 g  17 g Oral Daily PRN Levester Fresh M, PA-C       pravastatin (PRAVACHOL) tablet 80 mg  80 mg Oral q1800 Levester Fresh M, PA-C   80 mg at 03/11/23 1821     Discharge Medications: Please see discharge summary for a list of discharge medications.  Relevant Imaging Results:  Relevant Lab Results:   Additional Information SSN [id number]  Eduard Roux, LCSW

## 2023-03-13 DIAGNOSIS — W19XXXA Unspecified fall, initial encounter: Secondary | ICD-10-CM | POA: Diagnosis not present

## 2023-03-13 DIAGNOSIS — S72002A Fracture of unspecified part of neck of left femur, initial encounter for closed fracture: Secondary | ICD-10-CM | POA: Diagnosis not present

## 2023-03-13 LAB — COMPREHENSIVE METABOLIC PANEL
ALT: 15 U/L (ref 0–44)
AST: 30 U/L (ref 15–41)
Albumin: 2.8 g/dL — ABNORMAL LOW (ref 3.5–5.0)
Alkaline Phosphatase: 29 U/L — ABNORMAL LOW (ref 38–126)
Anion gap: 10 (ref 5–15)
BUN: 27 mg/dL — ABNORMAL HIGH (ref 8–23)
CO2: 28 mmol/L (ref 22–32)
Calcium: 9.1 mg/dL (ref 8.9–10.3)
Chloride: 102 mmol/L (ref 98–111)
Creatinine, Ser: 1.02 mg/dL — ABNORMAL HIGH (ref 0.44–1.00)
GFR, Estimated: 53 mL/min — ABNORMAL LOW (ref >60)
Glucose, Bld: 118 mg/dL — ABNORMAL HIGH (ref 70–99)
Potassium: 3.9 mmol/L (ref 3.5–5.1)
Sodium: 140 mmol/L (ref 135–145)
Total Bilirubin: 0.6 mg/dL (ref 0.3–1.2)
Total Protein: 5.6 g/dL — ABNORMAL LOW (ref 6.5–8.1)

## 2023-03-13 LAB — CBC
HCT: 26.2 % — ABNORMAL LOW (ref 36.0–46.0)
Hemoglobin: 9 g/dL — ABNORMAL LOW (ref 12.0–15.0)
MCH: 30.5 pg (ref 26.0–34.0)
MCHC: 34.4 g/dL (ref 30.0–36.0)
MCV: 88.8 fL (ref 80.0–100.0)
Platelets: 201 10*3/uL (ref 150–400)
RBC: 2.95 MIL/uL — ABNORMAL LOW (ref 3.87–5.11)
RDW: 14.2 % (ref 11.5–15.5)
WBC: 5.7 10*3/uL (ref 4.0–10.5)
nRBC: 0 % (ref 0.0–0.2)

## 2023-03-13 MED ORDER — HYDROCODONE-ACETAMINOPHEN 5-325 MG PO TABS: 1.0000 | ORAL_TABLET | ORAL | 0 refills | Status: DC | PRN

## 2023-03-13 MED ORDER — DOCUSATE SODIUM 100 MG PO CAPS: 100.0000 mg | ORAL_CAPSULE | Freq: Two times a day (BID) | ORAL | 0 refills | Status: DC

## 2023-03-13 MED ORDER — BISACODYL 10 MG RE SUPP: 10.0000 mg | Freq: Every day | RECTAL | 0 refills | Status: DC | PRN

## 2023-03-13 MED ORDER — CLOPIDOGREL BISULFATE 75 MG PO TABS
75.0000 mg | ORAL_TABLET | Freq: Every day | ORAL | Status: DC
  Administered 2023-03-13: 75 mg via ORAL
  Filled 2023-03-13: qty 1

## 2023-03-13 NOTE — Progress Notes (Signed)
Gracy Bruins to be D/C'd Skilled nursing facility per MD order.  Discussed with the patient and all questions fully answered.  IV catheter discontinued intact. Site without signs and symptoms of complications. Dressing and pressure applied.  An After Visit Summary was printed to [facility] for facility. Patient signed prescriptions sent in packet for facility.  Report called to [facility], receiving nurse at [facility]   Patient instructed to return to ED, call 911, or call MD for any changes in condition.   Patient escorted via stretcher, and D/C to [facility] via [facility].  Pauletta Browns 03/13/2023 3:19 PM

## 2023-03-13 NOTE — TOC Transition Note (Signed)
Transition of Care [facility] - CM/SW Discharge Note   Patient Details  Name: Gina Bond MRN: [id number] Date of Birth: 28-Jun-1932  [facility] CM/SW Contact:  Pippa Hanif A [place], Theresia Majors Phone Number: 03/13/2023, 2:53 PM   Clinical Narrative:    CSW confirmed [facility] bed. Pt medically stable. Spoke with daughter Karoline Caldwell, she is agreement to transfer pt to SNF today. PTAR contacted. CSW sent discharge information to [facility].   Nurse to call report to [phone] Room 105   Final next level of care: Skilled Nursing Facility Barriers to Discharge: Barriers Resolved   Patient Goals and CMS Choice      Discharge Placement                Patient chooses bed at: [facility] Patient to be transferred to facility by: PTAR Name of family member notified: [name] Patient and family notified of of transfer: 03/13/23  Discharge Plan and Services Additional resources added to the After Visit Summary for   In-house Referral: Clinical Social Work                                   Social Determinants of Health (SDOH) Interventions SDOH Screenings   Food Insecurity: No Food Insecurity (03/09/2023)  Housing: Low Risk  (03/09/2023)  Transportation Needs: No Transportation Needs (03/09/2023)  Utilities: Not At Risk (03/09/2023)  Tobacco Use: Medium Risk (03/12/2023)     Readmission Risk Interventions    08/20/2021    1:02 PM  Readmission Risk Prevention Plan  Transportation Screening Complete  PCP or Specialist Appt within 5-7 Days Complete  Home Care Screening Complete  Medication Review (RN CM) Complete
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
## 2023-03-13 NOTE — Discharge Summary (Signed)
Physician Discharge Summary   Patient: Gina Bond MRN: [id number] DOB: 1932-09-19  Admit date:     03/08/2023  Discharge date: 03/13/23  Discharge Physician: Rickey Barbara   PCP: Verlon Au, MD   Recommendations at discharge:   Follow up with PCP in 1-2 weeks Follow up with Orthopedic Surgery as scheduled    Discharge Diagnoses: Principal Problem:   Closed left hip fracture, initial encounter Active Problems:   COPD (chronic obstructive pulmonary disease)   Essential hypertension   Chronic kidney disease, stage 3b   Coronary artery disease involving native coronary artery of native heart without angina pectoris   Abdominal aortic aneurysm   Memory loss  Resolved Problems:   * No resolved hospital problems. *  Hospital Course: 89 year old female with history of hypertension, hyperlipidemia, coronary artery disease, CKD stage IIIb and memory loss presented to the emergency department with left hip pain after unwitnessed fall. She lives independent. Her daughter was unable to reach her so went to check on her, found her on the bedroom floor and complaining of severe left hip pain. Patient does not remember falling. In the emergency room afebrile. On room air. EKG with incomplete right bundle branch block. Chest x-ray negative. Skeletal survey including CT scan of the head and CT cervical spine was essentially normal. X-ray of the hip consistent with left intertrochanteric femur fracture. Admitted with surgical consultation.   Assessment and Plan: Closed traumatic left hip fracture: Status post ORIF with IM nail intertrochanteric fracture, Dr. Eulah Pont Weightbearing as tolerated left lower extremity. Per Ortho, restart plavix for DVT prophylaxis Pain managed with oral pain medications along with bowel regimen. Therapy recs for SNF   Anemia of acute blood loss: Anticipated from long bone fracture, hematoma and surgery along with hemodilution. Baseline hemoglobin  13-8-7.2.  Patient has multiple medical problems and will benefit with keeping hemoglobin more than 8 with given history of coronary artery disease. Transfused 1 unit of PRBC 4/6.  Post-transfusion hgb much improved. No evidence of blood loss Continue with Plavix   Chronic medical issues including Coronary artery disease, stable.  Continued on nitrates and pravastatin.  -continue plavix   AAA, stable.   Essential hypertension, blood pressure elevated in the ER.  On hydralazine and nebivolo with good bp control. Verapamil was held given very good bp control   CKD stage IIIb, at about her baseline.     COPD, not in exacerbation.  Albuterol as needed.       Consultants: Orthopedic Surgery Procedures performed: IM nail to L hip  Disposition: Skilled nursing facility Diet recommendation:  Regular diet DISCHARGE MEDICATION: Allergies as of 03/13/2023       Reactions   Benicar [olmesartan] Shortness Of Breath, Other (See Comments)   Must be same manufacturer or shortness of breath (NO LONGER TAKES THIS)   Fenofibrate Shortness Of Breath, Other (See Comments)   Must be the same manufacturer or shortness of breath AMNEAL IS THE PREFERRED BRAND   Other Shortness Of Breath, Rash, Other (See Comments)   Wool = Rashes Pt states that steroid inhalers and a lot of other inhalers make her more SOB.  Pt states that changes in medications have caused her to have sob and chest pressure (change in manufacturer of medication) Certain dyes; Fillers used by some manufacturers   Sulfa Antibiotics Hives   Sulfacetamide Sodium Hives   Atorvastatin Other (See Comments)   Cramps in legs   Magnesium Other (See Comments)   Leg cramps  Ondansetron Other (See Comments)   Made the patient appear disoriented   Rosuvastatin Calcium Other (See Comments)   Cramps in legs   Spironolactone Other (See Comments)   BREATHING DIFFICULTIES   Statins Other (See Comments)   Cramps in legs   Sulfamethoxazole Rash    Tobramycin Rash, Other (See Comments)   Blurred vision, also        Medication List     STOP taking these medications    verapamil 240 MG CR tablet Commonly known as: CALAN-SR       TAKE these medications    acetaminophen 500 MG tablet Commonly known as: TYLENOL Take 500 mg by mouth every 6 (six) hours as needed (pain).   albuterol 108 (90 Base) MCG/ACT inhaler Commonly known as: VENTOLIN HFA Inhale 2 puffs into the lungs every 6 (six) hours as needed for wheezing or shortness of breath.   bisacodyl 10 MG suppository Commonly known as: DULCOLAX Place 1 suppository (10 mg total) rectally daily as needed for moderate constipation.   Centrum Silver 50+Women Tabs Take 1 tablet by mouth daily with breakfast.   cholecalciferol 25 MCG (1000 UNIT) tablet Commonly known as: VITAMIN D3 Take 1,000 Units by mouth daily with supper.   clopidogrel 75 MG tablet Commonly known as: PLAVIX TAKE 1 TABLET(75 MG) BY MOUTH DAILY What changed: See the new instructions.   docusate sodium 100 MG capsule Commonly known as: COLACE Take 1 capsule (100 mg total) by mouth 2 (two) times daily.   fenofibrate micronized 134 MG capsule Commonly known as: LOFIBRA Take 134 mg by mouth daily after supper. Alembic   hydrALAZINE 50 MG tablet Commonly known as: APRESOLINE Take 1 tablet (50 mg total) by mouth 3 (three) times daily.   HYDROcodone-acetaminophen 5-325 MG tablet Commonly known as: NORCO/VICODIN Take 1-2 tablets by mouth every 4 (four) hours as needed for moderate pain (pain score 4-6).   isosorbide dinitrate 30 MG tablet Commonly known as: ISORDIL TAKE 1 TABLET(30 MG) BY MOUTH THREE TIMES DAILY What changed: See the new instructions.   nebivolol 10 MG tablet Commonly known as: BYSTOLIC TAKE 1/2 TABLET(5 MG) BY MOUTH DAILY What changed: See the new instructions.   nitroGLYCERIN 0.4 MG SL tablet Commonly known as: NITROSTAT Place 1 tablet (0.4 mg total) under the tongue  every 5 (five) minutes as needed for chest pain.   pantoprazole 40 MG tablet Commonly known as: PROTONIX Take 1 tablet (40 mg total) by mouth daily.   polyethylene glycol powder 17 GM/SCOOP powder Commonly known as: GLYCOLAX/MIRALAX Take 17 g by mouth daily as needed for mild constipation.   pravastatin 80 MG tablet Commonly known as: PRAVACHOL Take 1 tablet (80 mg total) by mouth daily at 6 PM. What changed: when to take this   senna 8.6 MG tablet Commonly known as: SENOKOT Take 1 tablet by mouth daily with breakfast.        Follow-up Information     Sheral Apley, MD. Schedule an appointment as soon as possible for a visit in 2 week(s).   Specialty: Orthopedic Surgery Contact information: [address] [place] [address] [phone]         Verlon Au, MD Follow up in 2 week(s).   Specialty: Family Medicine Why: Hospital follow up Contact information: [address] [place] [address] [phone]                Discharge Exam: Ceasar Mons Weights  03/08/23 2235 03/09/23 1113  Weight: 68 kg 62 kg   General exam: Awake, laying in bed, in nad Respiratory system: Normal respiratory effort, no wheezing Cardiovascular system: regular rate, s1, s2 Gastrointestinal system: Soft, nondistended, positive BS Central nervous system: CN2-12 grossly intact, strength intact Extremities: Perfused, no clubbing Skin: Normal skin turgor, no notable skin lesions seen Psychiatry: Mood normal // no visual hallucinations   Condition at discharge: fair  The results of significant diagnostics from this hospitalization (including imaging, microbiology, ancillary and laboratory) are listed below for reference.   Imaging Studies: DG FEMUR MIN 2 VIEWS LEFT  Result Date: 03/09/2023 CLINICAL DATA:  Intramedullary nail intertrochanteric femur. Intraoperative fluoroscopy. EXAM: LEFT FEMUR 2 VIEWS COMPARISON:  Pelvis and  left hip radiographs 03/08/2023 FINDINGS: Images were performed intraoperatively without the presence of a radiologist. The patient is undergoing cephalomedullary nail fixation of the previously seen intertrochanteric fracture. Markedly improved alignment. Total fluoroscopy images: 6 Total fluoroscopy time: 40 seconds Total dose: Radiation Exposure Index (as provided by the fluoroscopic device): 9.40 mGy air Kerma Please see intraoperative findings for further detail. IMPRESSION: Intraoperative fluoroscopy for cephalomedullary nail fixation of the left intertrochanteric fracture. Electronically Signed   By: Neita Garnet M.D.   On: 03/09/2023 15:21   DG C-Arm 1-60 Min-No Report  Result Date: 03/09/2023 Fluoroscopy was utilized by the requesting physician.  No radiographic interpretation.   CT CERVICAL SPINE WO CONTRAST  Result Date: 03/08/2023 CLINICAL DATA:  Fall on blood thinners EXAM: CT HEAD WITHOUT CONTRAST CT CERVICAL SPINE WITHOUT CONTRAST TECHNIQUE: Multidetector CT imaging of the head and cervical spine was performed following the standard protocol without intravenous contrast. Multiplanar CT image reconstructions of the cervical spine were also generated. RADIATION DOSE REDUCTION: This exam was performed according to the departmental dose-optimization program which includes automated exposure control, adjustment of the mA and/or kV according to patient size and/or use of iterative reconstruction technique. COMPARISON:  03/15/2022 CT head, 08/17/2021 CT cervical spine FINDINGS: CT HEAD FINDINGS Brain: No evidence of acute infarct, hemorrhage, mass, mass effect, or midline shift. No hydrocephalus or extra-axial fluid collection. Periventricular white matter changes, likely the sequela of chronic small vessel ischemic disease. Vascular: No hyperdense vessel. Skull: Negative for fracture or focal lesion. Sinuses/Orbits: No acute finding. Other: The mastoid air cells are well aerated. CT CERVICAL SPINE  FINDINGS Alignment: Straightening and mild reversal of the normal cervical lordosis, with trace stepwise anterolisthesis of C3 on C4, C4 on C5, and C5 on, unchanged. Skull base and vertebrae: No acute fracture. No primary bone lesion or focal pathologic process. Soft tissues and spinal canal: No prevertebral fluid or swelling. No visible canal hematoma. Disc levels: Degenerative changes in the cervical spine. No significant spinal canal stenosis. Upper chest: Emphysema. No focal pulmonary opacity or pleural effusion. IMPRESSION: 1. No acute intracranial process. 2. No acute fracture or traumatic subluxation in the cervical spine. Electronically Signed   By: Wiliam Ke M.D.   On: 03/08/2023 23:09   CT HEAD WO CONTRAST  Result Date: 03/08/2023 CLINICAL DATA:  Fall on blood thinners EXAM: CT HEAD WITHOUT CONTRAST CT CERVICAL SPINE WITHOUT CONTRAST TECHNIQUE: Multidetector CT imaging of the head and cervical spine was performed following the standard protocol without intravenous contrast. Multiplanar CT image reconstructions of the cervical spine were also generated. RADIATION DOSE REDUCTION: This exam was performed according to the departmental dose-optimization program which includes automated exposure control, adjustment of the mA and/or kV according to patient size and/or use of iterative reconstruction technique.  COMPARISON:  03/15/2022 CT head, 08/17/2021 CT cervical spine FINDINGS: CT HEAD FINDINGS Brain: No evidence of acute infarct, hemorrhage, mass, mass effect, or midline shift. No hydrocephalus or extra-axial fluid collection. Periventricular white matter changes, likely the sequela of chronic small vessel ischemic disease. Vascular: No hyperdense vessel. Skull: Negative for fracture or focal lesion. Sinuses/Orbits: No acute finding. Other: The mastoid air cells are well aerated. CT CERVICAL SPINE FINDINGS Alignment: Straightening and mild reversal of the normal cervical lordosis, with trace stepwise  anterolisthesis of C3 on C4, C4 on C5, and C5 on, unchanged. Skull base and vertebrae: No acute fracture. No primary bone lesion or focal pathologic process. Soft tissues and spinal canal: No prevertebral fluid or swelling. No visible canal hematoma. Disc levels: Degenerative changes in the cervical spine. No significant spinal canal stenosis. Upper chest: Emphysema. No focal pulmonary opacity or pleural effusion. IMPRESSION: 1. No acute intracranial process. 2. No acute fracture or traumatic subluxation in the cervical spine. Electronically Signed   By: Wiliam KeAlison  Vasan M.D.   On: 03/08/2023 23:09   DG Hip Unilat W or Wo Pelvis 2-3 Views Left  Result Date: 03/08/2023 CLINICAL DATA:  Fall. EXAM: DG HIP (WITH OR WITHOUT PELVIS) 2-3V LEFT COMPARISON:  CT abdomen and pelvis 12/04/2019 FINDINGS: Acute comminuted displaced intertrochanteric fracture of the left femur. The lesser trochanter is mildly displaced medially and inferiorly. There is mild lateral displacement of the dominant fragment. The left femoral head remains located within the acetabulum. Surgical clips over the right hemipelvis. IMPRESSION: Acute comminuted displaced intertrochanteric fracture of the left femur. Electronically Signed   By: Minerva Festeryler  Stutzman M.D.   On: 03/08/2023 22:59   DG Chest Port 1 View  Result Date: 03/08/2023 CLINICAL DATA:  Patient reports lying on the floor because it was more comfortable. Trauma. Denies hitting head. She has not on Plavix. EXAM: PORTABLE CHEST 1 VIEW COMPARISON:  02/15/2022 FINDINGS: Unchanged cardiomediastinal silhouette. Aortic atherosclerotic calcification. No focal consolidation, pleural effusion, or pneumothorax. No displaced rib fractures. IMPRESSION: No active disease. Electronically Signed   By: Minerva Festeryler  Stutzman M.D.   On: 03/08/2023 22:57    Microbiology: Results for orders placed or performed during the hospital encounter of 02/15/22  Resp Panel by RT-PCR (Flu A&B, Covid) Nasopharyngeal Swab      Status: None   Collection Time: 02/15/22  5:21 PM   Specimen: Nasopharyngeal Swab; Nasopharyngeal(NP) swabs in vial transport medium  Result Value Ref Range Status   SARS Coronavirus 2 by RT PCR NEGATIVE NEGATIVE Final    Comment: (NOTE) SARS-CoV-2 target nucleic acids are NOT DETECTED.  The SARS-CoV-2 RNA is generally detectable in upper respiratory specimens during the acute phase of infection. The lowest concentration of SARS-CoV-2 viral copies this assay can detect is 138 copies/mL. A negative result does not preclude SARS-Cov-2 infection and should not be used as the sole basis for treatment or other patient management decisions. A negative result may occur with  improper specimen collection/handling, submission of specimen other than nasopharyngeal swab, presence of viral mutation(s) within the areas targeted by this assay, and inadequate number of viral copies(<138 copies/mL). A negative result must be combined with clinical observations, patient history, and epidemiological information. The expected result is Negative.  Fact Sheet for Patients:  BloggerCourse.comhttps://www.fda.gov/media/152166/download  Fact Sheet for Healthcare Providers:  SeriousBroker.ithttps://www.fda.gov/media/152162/download  This test is no t yet approved or cleared by the [place] FDA and  has been authorized for detection and/or diagnosis of SARS-CoV-2 by FDA under an Emergency Use Authorization (  EUA). This EUA will remain  in effect (meaning this test can be used) for the duration of the COVID-19 declaration under Section 564(b)(1) of the Act, 21 U.S.C.section 360bbb-3(b)(1), unless the authorization is terminated  or revoked sooner.       Influenza A by PCR NEGATIVE NEGATIVE Final   Influenza B by PCR NEGATIVE NEGATIVE Final    Comment: (NOTE) The Xpert Xpress SARS-CoV-2/FLU/RSV plus assay is intended as an aid in the diagnosis of influenza from Nasopharyngeal swab specimens and should not be used as a sole basis  for treatment. Nasal washings and aspirates are unacceptable for Xpert Xpress SARS-CoV-2/FLU/RSV testing.  Fact Sheet for Patients: BloggerCourse.com  Fact Sheet for Healthcare Providers: SeriousBroker.it  This test is not yet approved or cleared by the [place] FDA and has been authorized for detection and/or diagnosis of SARS-CoV-2 by FDA under an Emergency Use Authorization (EUA). This EUA will remain in effect (meaning this test can be used) for the duration of the COVID-19 declaration under Section 564(b)(1) of the Act, 21 U.S.C. section 360bbb-3(b)(1), unless the authorization is terminated or revoked.  Performed at [facility], [address] [place], [place] [address]     Labs: CBC: Recent Labs  Lab 03/08/23 2234 03/08/23 2240 03/09/23 0412 03/10/23 1428 03/11/23 0311 03/11/23 1742 03/13/23 0411  WBC 11.1*  --  9.6 8.0 6.7  --  5.7  HGB 14.3   < > 13.1 8.0* 7.2* 8.9* 9.0*  HCT 43.9   < > 38.4 23.9* 21.2* 26.3* 26.2*  MCV 92.0  --  88.9 90.5 90.6  --  88.8  PLT 216  --  205 147* 144*  --  201   < > = values in this interval not displayed.   Basic Metabolic Panel: Recent Labs  Lab 03/08/23 2234 03/08/23 2240 03/09/23 0412 03/10/23 1428 03/11/23 0311 03/13/23 0411  NA 139 142 140 137 136 140  K 3.8 3.8 3.6 3.5 3.7 3.9  CL 104 105 103 104 101 102  CO2 23  --  24 24 26 28   GLUCOSE 128* 125* 132* 143* 113* 118*  BUN 26* 25* 27* 33* 33* 27*  CREATININE 1.40* 1.40* 1.42* 1.54* 1.49* 1.02*  CALCIUM 9.5  --  9.3 8.2* 8.5* 9.1   Liver Function Tests: Recent Labs  Lab 03/08/23 2234 03/13/23 0411  AST 46* 30  ALT 20 15  ALKPHOS 32* 29*  BILITOT 1.6* 0.6  PROT 6.5 5.6*  ALBUMIN 4.1 2.8*   CBG: No results for input(s): "GLUCAP" in the last 168 hours.  Discharge time spent: less than 30 minutes.  Signed: Rickey Barbara, MD [facility] 03/13/2023

## 2023-03-14 ENCOUNTER — Other Ambulatory Visit: Payer: Medicare Other

## 2023-03-16 ENCOUNTER — Ambulatory Visit: Payer: Medicare Other | Admitting: Podiatry

## 2023-03-30 ENCOUNTER — Ambulatory Visit: Payer: Medicare Other | Admitting: Cardiology

## 2023-05-21 ENCOUNTER — Other Ambulatory Visit: Payer: Self-pay | Admitting: Cardiology

## 2023-06-05 ENCOUNTER — Other Ambulatory Visit: Payer: Self-pay | Admitting: Cardiology

## 2023-06-05 DIAGNOSIS — I10 Essential (primary) hypertension: Secondary | ICD-10-CM

## 2023-07-19 NOTE — Telephone Encounter (Signed)
done

## 2023-07-22 ENCOUNTER — Other Ambulatory Visit: Payer: Self-pay | Admitting: Cardiology

## 2023-08-11 NOTE — Telephone Encounter (Signed)
This encounter was created in error - please disregard.

## 2023-10-28 ENCOUNTER — Other Ambulatory Visit: Payer: Self-pay | Admitting: Cardiology

## 2023-11-23 ENCOUNTER — Other Ambulatory Visit: Payer: Self-pay | Admitting: Cardiology

## 2023-11-23 DIAGNOSIS — I10 Essential (primary) hypertension: Secondary | ICD-10-CM

## 2023-11-27 ENCOUNTER — Telehealth: Payer: Self-pay | Admitting: Cardiology

## 2023-11-27 MED ORDER — NEBIVOLOL HCL 10 MG PO TABS: 5.0000 mg | ORAL_TABLET | Freq: Every day | ORAL | 2 refills | Status: DC

## 2023-11-27 NOTE — Telephone Encounter (Signed)
*  STAT* If patient is at the pharmacy, call can be transferred to refill team.   1. Which medications need to be refilled? (please list name of each medication and dose if known) nebivolol (BYSTOLIC) 10 MG tablet    2. Would you like to learn more about the convenience, safety, & potential cost savings by using the [facility]?  N/A   3. Are you open to using the [facility] (Type [facility]. N/A   4. Which pharmacy/location (including street and city if local pharmacy) is medication to be sent to?  [id number] - [place], [place] - [address]     5. Do they need a 30 day or 90 day supply? 90 day  Patient will run out of medication this week.

## 2023-12-01 ENCOUNTER — Other Ambulatory Visit: Payer: Self-pay | Admitting: Cardiology

## 2023-12-01 DIAGNOSIS — I10 Essential (primary) hypertension: Secondary | ICD-10-CM

## 2023-12-02 ENCOUNTER — Other Ambulatory Visit: Payer: Self-pay | Admitting: Cardiology

## 2023-12-02 DIAGNOSIS — I10 Essential (primary) hypertension: Secondary | ICD-10-CM

## 2023-12-04 ENCOUNTER — Telehealth: Payer: Self-pay | Admitting: Cardiology

## 2023-12-04 ENCOUNTER — Other Ambulatory Visit (HOSPITAL_COMMUNITY): Payer: Self-pay

## 2023-12-04 ENCOUNTER — Other Ambulatory Visit: Payer: Self-pay | Admitting: Cardiology

## 2023-12-04 DIAGNOSIS — I10 Essential (primary) hypertension: Secondary | ICD-10-CM

## 2023-12-04 MED ORDER — ISOSORBIDE DINITRATE 30 MG PO TABS
30.0000 mg | ORAL_TABLET | Freq: Every day | ORAL | 0 refills | Status: DC
Start: 2023-12-04 — End: 2023-12-13
  Filled 2023-12-04: qty 90, 90d supply, fill #0

## 2023-12-04 NOTE — Telephone Encounter (Signed)
Pt's medication was sent to pt's pharmacy as requested. Confirmation received.  °

## 2023-12-04 NOTE — Telephone Encounter (Signed)
*  STAT* If patient is at the pharmacy, call can be transferred to refill team.   1. Which medications need to be refilled? (please list name of each medication and dose if known) isosorbide dinitrate (ISORDIL) 30 MG tablet     4. Which pharmacy/location (including street and city if local pharmacy) is medication to be sent to? [id number] - [place], [place] - [address]     5. Do they need a 30 day or 90 day supply? 90    Pt is scheduled for 01/08/24 with Dr. Jacinto Halim

## 2023-12-12 ENCOUNTER — Other Ambulatory Visit: Payer: Self-pay | Admitting: Cardiology

## 2023-12-12 DIAGNOSIS — I10 Essential (primary) hypertension: Secondary | ICD-10-CM

## 2023-12-21 ENCOUNTER — Other Ambulatory Visit: Payer: Self-pay | Admitting: Cardiology

## 2023-12-21 DIAGNOSIS — I10 Essential (primary) hypertension: Secondary | ICD-10-CM

## 2023-12-22 ENCOUNTER — Other Ambulatory Visit: Payer: Self-pay | Admitting: Cardiology

## 2023-12-22 DIAGNOSIS — I10 Essential (primary) hypertension: Secondary | ICD-10-CM

## 2023-12-23 ENCOUNTER — Emergency Department (HOSPITAL_COMMUNITY): Payer: Medicare Other

## 2023-12-23 ENCOUNTER — Inpatient Hospital Stay (HOSPITAL_COMMUNITY)
Admission: EM | Admit: 2023-12-23 | Discharge: 2023-12-29 | DRG: 281 | Disposition: A | Discharge status: Skilled Nursing Facility | Payer: Medicare Other | Attending: Internal Medicine | Admitting: Internal Medicine

## 2023-12-23 ENCOUNTER — Other Ambulatory Visit: Payer: Self-pay

## 2023-12-23 ENCOUNTER — Encounter (HOSPITAL_COMMUNITY): Payer: Self-pay

## 2023-12-23 DIAGNOSIS — Z1152 Encounter for screening for COVID-19: Secondary | ICD-10-CM

## 2023-12-23 DIAGNOSIS — I169 Hypertensive crisis, unspecified: Secondary | ICD-10-CM

## 2023-12-23 DIAGNOSIS — I129 Hypertensive chronic kidney disease with stage 1 through stage 4 chronic kidney disease, or unspecified chronic kidney disease: Secondary | ICD-10-CM | POA: Diagnosis present

## 2023-12-23 DIAGNOSIS — F4310 Post-traumatic stress disorder, unspecified: Secondary | ICD-10-CM | POA: Diagnosis present

## 2023-12-23 DIAGNOSIS — Z66 Do not resuscitate: Secondary | ICD-10-CM | POA: Diagnosis not present

## 2023-12-23 DIAGNOSIS — E8809 Other disorders of plasma-protein metabolism, not elsewhere classified: Secondary | ICD-10-CM | POA: Diagnosis not present

## 2023-12-23 DIAGNOSIS — F0392 Unspecified dementia, unspecified severity, with psychotic disturbance: Secondary | ICD-10-CM | POA: Diagnosis present

## 2023-12-23 DIAGNOSIS — G9340 Encephalopathy, unspecified: Secondary | ICD-10-CM | POA: Diagnosis not present

## 2023-12-23 DIAGNOSIS — I4819 Other persistent atrial fibrillation: Secondary | ICD-10-CM | POA: Diagnosis present

## 2023-12-23 DIAGNOSIS — Z87891 Personal history of nicotine dependence: Secondary | ICD-10-CM

## 2023-12-23 DIAGNOSIS — I2489 Other forms of acute ischemic heart disease: Secondary | ICD-10-CM

## 2023-12-23 DIAGNOSIS — J449 Chronic obstructive pulmonary disease, unspecified: Secondary | ICD-10-CM

## 2023-12-23 DIAGNOSIS — Z85828 Personal history of other malignant neoplasm of skin: Secondary | ICD-10-CM

## 2023-12-23 DIAGNOSIS — Z881 Allergy status to other antibiotic agents status: Secondary | ICD-10-CM

## 2023-12-23 DIAGNOSIS — Z9181 History of falling: Secondary | ICD-10-CM

## 2023-12-23 DIAGNOSIS — I4891 Unspecified atrial fibrillation: Secondary | ICD-10-CM

## 2023-12-23 DIAGNOSIS — R413 Other amnesia: Secondary | ICD-10-CM | POA: Diagnosis present

## 2023-12-23 DIAGNOSIS — Z820 Family history of epilepsy and other diseases of the nervous system: Secondary | ICD-10-CM

## 2023-12-23 DIAGNOSIS — I160 Hypertensive urgency: Secondary | ICD-10-CM | POA: Diagnosis present

## 2023-12-23 DIAGNOSIS — I714 Abdominal aortic aneurysm, without rupture, unspecified: Secondary | ICD-10-CM | POA: Diagnosis present

## 2023-12-23 DIAGNOSIS — N179 Acute kidney failure, unspecified: Secondary | ICD-10-CM | POA: Diagnosis not present

## 2023-12-23 DIAGNOSIS — R410 Disorientation, unspecified: Principal | ICD-10-CM | POA: Diagnosis present

## 2023-12-23 DIAGNOSIS — R4182 Altered mental status, unspecified: Secondary | ICD-10-CM | POA: Diagnosis not present

## 2023-12-23 DIAGNOSIS — I2510 Atherosclerotic heart disease of native coronary artery without angina pectoris: Secondary | ICD-10-CM | POA: Diagnosis not present

## 2023-12-23 DIAGNOSIS — I495 Sick sinus syndrome: Secondary | ICD-10-CM | POA: Diagnosis not present

## 2023-12-23 DIAGNOSIS — F40240 Claustrophobia: Secondary | ICD-10-CM | POA: Diagnosis present

## 2023-12-23 DIAGNOSIS — Z823 Family history of stroke: Secondary | ICD-10-CM

## 2023-12-23 DIAGNOSIS — R7989 Other specified abnormal findings of blood chemistry: Secondary | ICD-10-CM

## 2023-12-23 DIAGNOSIS — I10 Essential (primary) hypertension: Secondary | ICD-10-CM | POA: Diagnosis present

## 2023-12-23 DIAGNOSIS — Z888 Allergy status to other drugs, medicaments and biological substances status: Secondary | ICD-10-CM

## 2023-12-23 DIAGNOSIS — N1832 Chronic kidney disease, stage 3b: Secondary | ICD-10-CM

## 2023-12-23 DIAGNOSIS — I951 Orthostatic hypotension: Secondary | ICD-10-CM | POA: Diagnosis present

## 2023-12-23 DIAGNOSIS — Z882 Allergy status to sulfonamides status: Secondary | ICD-10-CM

## 2023-12-23 DIAGNOSIS — Z801 Family history of malignant neoplasm of trachea, bronchus and lung: Secondary | ICD-10-CM

## 2023-12-23 DIAGNOSIS — Z79899 Other long term (current) drug therapy: Secondary | ICD-10-CM

## 2023-12-23 DIAGNOSIS — Z8249 Family history of ischemic heart disease and other diseases of the circulatory system: Secondary | ICD-10-CM

## 2023-12-23 DIAGNOSIS — I21A1 Myocardial infarction type 2: Secondary | ICD-10-CM | POA: Diagnosis not present

## 2023-12-23 DIAGNOSIS — E785 Hyperlipidemia, unspecified: Secondary | ICD-10-CM | POA: Diagnosis present

## 2023-12-23 DIAGNOSIS — I480 Paroxysmal atrial fibrillation: Secondary | ICD-10-CM

## 2023-12-23 DIAGNOSIS — Z7901 Long term (current) use of anticoagulants: Secondary | ICD-10-CM

## 2023-12-23 DIAGNOSIS — I674 Hypertensive encephalopathy: Secondary | ICD-10-CM | POA: Diagnosis present

## 2023-12-23 DIAGNOSIS — Z800 Family history of malignant neoplasm of digestive organs: Secondary | ICD-10-CM

## 2023-12-23 DIAGNOSIS — Z825 Family history of asthma and other chronic lower respiratory diseases: Secondary | ICD-10-CM

## 2023-12-23 DIAGNOSIS — Z7902 Long term (current) use of antithrombotics/antiplatelets: Secondary | ICD-10-CM

## 2023-12-23 DIAGNOSIS — Z905 Acquired absence of kidney: Secondary | ICD-10-CM

## 2023-12-23 DIAGNOSIS — Z831 Family history of other infectious and parasitic diseases: Secondary | ICD-10-CM

## 2023-12-23 DIAGNOSIS — I444 Left anterior fascicular block: Secondary | ICD-10-CM | POA: Diagnosis present

## 2023-12-23 DIAGNOSIS — Z9049 Acquired absence of other specified parts of digestive tract: Secondary | ICD-10-CM

## 2023-12-23 LAB — URINALYSIS, W/ REFLEX TO CULTURE (INFECTION SUSPECTED)
Bilirubin Urine: NEGATIVE
Glucose, UA: NEGATIVE mg/dL
Hgb urine dipstick: NEGATIVE
Ketones, ur: NEGATIVE mg/dL
Leukocytes,Ua: NEGATIVE
Nitrite: NEGATIVE
Protein, ur: 30 mg/dL — AB
Specific Gravity, Urine: 1.01 (ref 1.005–1.030)
pH: 6 (ref 5.0–8.0)

## 2023-12-23 LAB — COMPREHENSIVE METABOLIC PANEL
ALT: 14 U/L (ref 0–44)
AST: 25 U/L (ref 15–41)
Albumin: 4.1 g/dL (ref 3.5–5.0)
Alkaline Phosphatase: 32 U/L — ABNORMAL LOW (ref 38–126)
Anion gap: 12 (ref 5–15)
BUN: 24 mg/dL — ABNORMAL HIGH (ref 8–23)
CO2: 24 mmol/L (ref 22–32)
Calcium: 9.9 mg/dL (ref 8.9–10.3)
Chloride: 106 mmol/L (ref 98–111)
Creatinine, Ser: 1.52 mg/dL — ABNORMAL HIGH (ref 0.44–1.00)
GFR, Estimated: 33 mL/min — ABNORMAL LOW (ref >60)
Glucose, Bld: 103 mg/dL — ABNORMAL HIGH (ref 70–99)
Potassium: 3.7 mmol/L (ref 3.5–5.1)
Sodium: 142 mmol/L (ref 135–145)
Total Bilirubin: 0.7 mg/dL (ref 0.0–1.2)
Total Protein: 6.5 g/dL (ref 6.5–8.1)

## 2023-12-23 LAB — VITAMIN B12: Vitamin B-12: 459 pg/mL (ref 180–914)

## 2023-12-23 LAB — T4, FREE: Free T4: 1.01 ng/dL (ref 0.61–1.12)

## 2023-12-23 LAB — CBC WITH DIFFERENTIAL/PLATELET
Abs Immature Granulocytes: 0.03 10*3/uL (ref 0.00–0.07)
Basophils Absolute: 0.1 10*3/uL (ref 0.0–0.1)
Basophils Relative: 1 %
Eosinophils Absolute: 0.3 10*3/uL (ref 0.0–0.5)
Eosinophils Relative: 5 %
HCT: 41.8 % (ref 36.0–46.0)
Hemoglobin: 13.8 g/dL (ref 12.0–15.0)
Immature Granulocytes: 1 %
Lymphocytes Relative: 29 %
Lymphs Abs: 1.6 10*3/uL (ref 0.7–4.0)
MCH: 30.1 pg (ref 26.0–34.0)
MCHC: 33 g/dL (ref 30.0–36.0)
MCV: 91.3 fL (ref 80.0–100.0)
Monocytes Absolute: 0.6 10*3/uL (ref 0.1–1.0)
Monocytes Relative: 10 %
Neutro Abs: 3 10*3/uL (ref 1.7–7.7)
Neutrophils Relative %: 54 %
Platelets: 212 10*3/uL (ref 150–400)
RBC: 4.58 MIL/uL (ref 3.87–5.11)
RDW: 14.2 % (ref 11.5–15.5)
WBC: 5.5 10*3/uL (ref 4.0–10.5)
nRBC: 0 % (ref 0.0–0.2)

## 2023-12-23 LAB — TSH: TSH: 5.456 u[IU]/mL — ABNORMAL HIGH (ref 0.350–4.500)

## 2023-12-23 LAB — AMMONIA: Ammonia: 27 umol/L (ref 9–35)

## 2023-12-23 LAB — RESP PANEL BY RT-PCR (RSV, FLU A&B, COVID)  RVPGX2
Influenza A by PCR: NEGATIVE
Influenza B by PCR: NEGATIVE
Resp Syncytial Virus by PCR: NEGATIVE
SARS Coronavirus 2 by RT PCR: NEGATIVE

## 2023-12-23 LAB — TROPONIN I (HIGH SENSITIVITY)
Troponin I (High Sensitivity): 17 ng/L (ref <18)
Troponin I (High Sensitivity): 19 ng/L — ABNORMAL HIGH (ref <18)

## 2023-12-23 LAB — RAPID URINE DRUG SCREEN, HOSP PERFORMED
Amphetamines: NOT DETECTED
Barbiturates: NOT DETECTED
Benzodiazepines: NOT DETECTED
Cocaine: NOT DETECTED
Opiates: NOT DETECTED
Tetrahydrocannabinol: NOT DETECTED

## 2023-12-23 LAB — LIPASE, BLOOD: Lipase: 56 U/L — ABNORMAL HIGH (ref 11–51)

## 2023-12-23 LAB — MAGNESIUM: Magnesium: 1.9 mg/dL (ref 1.7–2.4)

## 2023-12-23 LAB — ETHANOL: Alcohol, Ethyl (B): <10 mg/dL (ref <10)

## 2023-12-23 LAB — PHOSPHORUS: Phosphorus: 2.8 mg/dL (ref 2.5–4.6)

## 2023-12-23 LAB — RPR: RPR Ser Ql: NONREACTIVE

## 2023-12-23 MED ORDER — ALBUTEROL SULFATE (2.5 MG/3ML) 0.083% IN NEBU: 3.0000 mL | INHALATION_SOLUTION | Freq: Four times a day (QID) | RESPIRATORY_TRACT | Status: DC | PRN

## 2023-12-23 MED ORDER — DOCUSATE SODIUM 100 MG PO CAPS
100.0000 mg | ORAL_CAPSULE | Freq: Two times a day (BID) | ORAL | Status: DC
  Administered 2023-12-23 – 2023-12-29 (×13): 100 mg via ORAL
  Filled 2023-12-23 (×13): qty 1

## 2023-12-23 MED ORDER — CLOPIDOGREL BISULFATE 75 MG PO TABS
75.0000 mg | ORAL_TABLET | Freq: Every day | ORAL | Status: DC
  Administered 2023-12-23 – 2023-12-25 (×3): 75 mg via ORAL
  Filled 2023-12-23 (×3): qty 1

## 2023-12-23 MED ORDER — HYDRALAZINE HCL 50 MG PO TABS: 50.0000 mg | ORAL_TABLET | Freq: Four times a day (QID) | ORAL | Status: DC | PRN

## 2023-12-23 MED ORDER — ACETAMINOPHEN 500 MG PO TABS: 500.0000 mg | ORAL_TABLET | Freq: Four times a day (QID) | ORAL | Status: DC | PRN

## 2023-12-23 MED ORDER — ACETAMINOPHEN 325 MG PO TABS: 650.0000 mg | ORAL_TABLET | Freq: Four times a day (QID) | ORAL | Status: DC | PRN

## 2023-12-23 MED ORDER — AMLODIPINE BESYLATE 10 MG PO TABS
10.0000 mg | ORAL_TABLET | Freq: Every day | ORAL | Status: DC
  Administered 2023-12-23 – 2023-12-24 (×2): 10 mg via ORAL
  Filled 2023-12-23: qty 1
  Filled 2023-12-23: qty 2

## 2023-12-23 MED ORDER — ACETAMINOPHEN 650 MG RE SUPP: 650.0000 mg | Freq: Four times a day (QID) | RECTAL | Status: DC | PRN

## 2023-12-23 MED ORDER — PANTOPRAZOLE SODIUM 40 MG PO TBEC
40.0000 mg | DELAYED_RELEASE_TABLET | Freq: Every day | ORAL | Status: DC
Start: 2023-12-23 — End: 2023-12-29
  Administered 2023-12-23 – 2023-12-29 (×7): 40 mg via ORAL
  Filled 2023-12-23 (×7): qty 1

## 2023-12-23 MED ORDER — ISOSORBIDE DINITRATE 10 MG PO TABS: 30.0000 mg | ORAL_TABLET | Freq: Three times a day (TID) | ORAL | Status: DC

## 2023-12-23 MED ORDER — HEPARIN SODIUM (PORCINE) 5000 UNIT/ML IJ SOLN
5000.0000 [IU] | Freq: Three times a day (TID) | INTRAMUSCULAR | Status: DC
  Administered 2023-12-23 – 2023-12-25 (×7): 5000 [IU] via SUBCUTANEOUS
  Filled 2023-12-23 (×7): qty 1

## 2023-12-23 MED ORDER — SENNOSIDES-DOCUSATE SODIUM 8.6-50 MG PO TABS: 1.0000 | ORAL_TABLET | Freq: Every evening | ORAL | Status: DC | PRN

## 2023-12-23 MED ORDER — HYDRALAZINE HCL 25 MG PO TABS
50.0000 mg | ORAL_TABLET | Freq: Three times a day (TID) | ORAL | Status: DC
  Administered 2023-12-23: 50 mg via ORAL
  Filled 2023-12-23: qty 2

## 2023-12-23 MED ORDER — ISOSORB DINITRATE-HYDRALAZINE 20-37.5 MG PO TABS
2.0000 | ORAL_TABLET | Freq: Three times a day (TID) | ORAL | Status: DC
  Administered 2023-12-23 – 2023-12-24 (×6): 2 via ORAL
  Filled 2023-12-23 (×6): qty 2

## 2023-12-23 MED ORDER — HYDRALAZINE HCL 20 MG/ML IJ SOLN
10.0000 mg | Freq: Once | INTRAMUSCULAR | Status: AC
  Administered 2023-12-23: 10 mg via INTRAVENOUS
  Filled 2023-12-23: qty 1

## 2023-12-23 MED ORDER — PROCHLORPERAZINE EDISYLATE 10 MG/2ML IJ SOLN: 5.0000 mg | Freq: Four times a day (QID) | INTRAMUSCULAR | Status: DC | PRN

## 2023-12-23 MED ORDER — NEBIVOLOL HCL 5 MG PO TABS: 5.0000 mg | ORAL_TABLET | Freq: Every day | ORAL | Status: DC

## 2023-12-23 MED ORDER — PRAVASTATIN SODIUM 40 MG PO TABS
80.0000 mg | ORAL_TABLET | Freq: Every day | ORAL | Status: DC
  Administered 2023-12-23 – 2023-12-28 (×6): 80 mg via ORAL
  Filled 2023-12-23 (×6): qty 2

## 2023-12-23 MED ORDER — SENNA 8.6 MG PO TABS
1.0000 | ORAL_TABLET | Freq: Every day | ORAL | Status: DC
  Administered 2023-12-23 – 2023-12-29 (×6): 8.6 mg via ORAL
  Filled 2023-12-23 (×8): qty 1

## 2023-12-23 MED ORDER — SODIUM CHLORIDE 0.9% FLUSH
3.0000 mL | Freq: Two times a day (BID) | INTRAVENOUS | Status: DC
  Administered 2023-12-23 – 2023-12-29 (×11): 3 mL via INTRAVENOUS

## 2023-12-23 MED ORDER — HYDRALAZINE HCL 25 MG PO TABS: 50.0000 mg | ORAL_TABLET | Freq: Three times a day (TID) | ORAL | Status: DC
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
## 2023-12-23 NOTE — ED Notes (Signed)
Called CCMD.

## 2023-12-23 NOTE — Care Management Obs Status (Signed)
MEDICARE OBSERVATION STATUS NOTIFICATION   Patient Details  Name: Amritha Salzillo MRN: [id number] Date of Birth: January 18, 1932   Medicare Observation Status Notification Given:  Yes    Lockie Pares, RN 12/23/2023, 9:01 AM

## 2023-12-23 NOTE — ED Notes (Signed)
Patient transported to CT 
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
## 2023-12-23 NOTE — Progress Notes (Signed)
NEW ADMISSION NOTE New Admission Note:   Arrival Method: stretcher from ER Mental Orientation: self only, talking a lot, gripping paper with her ID labels, will not follow instructions Telemetry: to be applied by next nurse Assessment: deferred to [facility] Skin: see LDA, minor bruising on abdomen and back, redness on back IV: PIV in R arm, NSL Pain: she reports headache gone Tubes: none Safety Measures: bed alarm on and call bell in reach and explained to pt Admission: deferred to [facility] Orientation: Patient has been orientated to the room, unit and staff.  Family: none at bedside  Will continue to monitor the patient. Call light has been placed within reach and bed alarm has been activated.   Pt arrived to unit at 0654. It took 15 minutes to get pt from stretcher to our bed. Cleaned of incontinent stool/urine. Admission deferred to [facility].   Irwin Brakeman, RN

## 2023-12-23 NOTE — ED Triage Notes (Signed)
Pt BIB [facility] from home. EMS was called out for agitation and confusion. Per family pt has been increasingly confused recently and "seeing things that are not there". A&Ox4. Ambulatory w/ EMS.  Pt is asking repeated questions.   EMS 218/92 BP 45 P 127 CBG

## 2023-12-23 NOTE — Progress Notes (Addendum)
   Gina Bond  ZOX:[id number] DOB: 1932/06/26 DOA: 12/23/2023 PCP: Verlon Au, MD    Brief Narrative:  89 year old with a history of HTN, HLD, CKD stage IIIb, CAD, AAA, COPD, and memory loss who presented to the ER 1/18 with increasing confusion agitation and hallucinations over the course of several days.  The patient herself had no complaints but her family had appreciated these changes.  Family reported no falls injuries or new medications.  In the ER CT head was negative for acute findings.  Goals of Care:   Code Status: Full Code   DVT prophylaxis: heparin injection 5,000 Units Start: 12/23/23 0600   Interim Hx: The patient was interviewed and examined by one of my partners earlier today.  Assessment & Plan:  Acute encephalopathy of unclear etiology -acute delirium -possible hypertensive encephalopathy CT head unrevealing -no clinical evidence of acute infection -no focal neurologic deficits -B12 is >400 -TSH elevated but not markedly so -respiratory virus panel negative -UA unrevealing -UDS negative -alcohol level <10 -ammonia normal  Uncontrolled hypertension -hypertensive urgency Blood pressure 217/93 at presentation  CKD stage IIIb Baseline creatinine appears to be 1.4  COPD Well compensated presently  CAD / AAA Continue Plavix statin and nitrates    Family Communication: No family present at the time of my exam Disposition: Unclear presently, may require SNF stay   Objective: Blood pressure (!) 187/82, pulse (!) 56, temperature (!) 97.3 F (36.3 C), temperature source Oral, resp. rate 15, height 5\' 6"  (1.676 m), weight 65.8 kg, SpO2 96%. No intake or output data in the 24 hours ending 12/23/23 0732 Filed Weights   12/23/23 0150  Weight: 65.8 kg    Examination: Patient was examined by one of my partners earlier today.  CBC: Recent Labs  Lab 12/23/23 0145  WBC 5.5  NEUTROABS 3.0  HGB 13.8  HCT 41.8  MCV 91.3  PLT 212   Basic  Metabolic Panel: Recent Labs  Lab 12/23/23 0145 12/23/23 0522  NA 142  --   K 3.7  --   CL 106  --   CO2 24  --   GLUCOSE 103*  --   BUN 24*  --   CREATININE 1.52*  --   CALCIUM 9.9  --   MG  --  1.9  PHOS  --  2.8   GFR: Estimated Creatinine Clearance: 23.5 mL/min (A) (by C-G formula based on SCr of 1.52 mg/dL (H)).   Scheduled Meds:  clopidogrel  75 mg Oral Daily   heparin  5,000 Units Subcutaneous Q8H   hydrALAZINE  50 mg Oral Q8H   isosorbide dinitrate  30 mg Oral TID   nebivolol  5 mg Oral Daily   pantoprazole  40 mg Oral Daily   pravastatin  80 mg Oral q1800   sodium chloride flush  3 mL Intravenous Q12H     LOS: 0 days   Lonia Blood, MD [facility] Office  [phone] Pager - Text Page per Loretha Stapler  If 7PM-7AM, please contact night-coverage per Amion 12/23/2023, 7:32 AM

## 2023-12-24 ENCOUNTER — Inpatient Hospital Stay (HOSPITAL_COMMUNITY): Payer: Medicare Other

## 2023-12-24 DIAGNOSIS — E785 Hyperlipidemia, unspecified: Secondary | ICD-10-CM | POA: Diagnosis present

## 2023-12-24 DIAGNOSIS — R413 Other amnesia: Secondary | ICD-10-CM | POA: Diagnosis not present

## 2023-12-24 DIAGNOSIS — Z87891 Personal history of nicotine dependence: Secondary | ICD-10-CM | POA: Diagnosis not present

## 2023-12-24 DIAGNOSIS — R4182 Altered mental status, unspecified: Secondary | ICD-10-CM | POA: Diagnosis present

## 2023-12-24 DIAGNOSIS — J449 Chronic obstructive pulmonary disease, unspecified: Secondary | ICD-10-CM | POA: Diagnosis present

## 2023-12-24 DIAGNOSIS — I21A1 Myocardial infarction type 2: Secondary | ICD-10-CM | POA: Diagnosis not present

## 2023-12-24 DIAGNOSIS — I444 Left anterior fascicular block: Secondary | ICD-10-CM | POA: Diagnosis present

## 2023-12-24 DIAGNOSIS — F40240 Claustrophobia: Secondary | ICD-10-CM | POA: Diagnosis present

## 2023-12-24 DIAGNOSIS — Z1152 Encounter for screening for COVID-19: Secondary | ICD-10-CM | POA: Diagnosis not present

## 2023-12-24 DIAGNOSIS — I169 Hypertensive crisis, unspecified: Secondary | ICD-10-CM | POA: Diagnosis not present

## 2023-12-24 DIAGNOSIS — N1832 Chronic kidney disease, stage 3b: Secondary | ICD-10-CM | POA: Diagnosis present

## 2023-12-24 DIAGNOSIS — I4891 Unspecified atrial fibrillation: Secondary | ICD-10-CM | POA: Diagnosis not present

## 2023-12-24 DIAGNOSIS — G9340 Encephalopathy, unspecified: Secondary | ICD-10-CM | POA: Diagnosis not present

## 2023-12-24 DIAGNOSIS — Z515 Encounter for palliative care: Secondary | ICD-10-CM | POA: Diagnosis not present

## 2023-12-24 DIAGNOSIS — Z79899 Other long term (current) drug therapy: Secondary | ICD-10-CM | POA: Diagnosis not present

## 2023-12-24 DIAGNOSIS — Z7901 Long term (current) use of anticoagulants: Secondary | ICD-10-CM | POA: Diagnosis not present

## 2023-12-24 DIAGNOSIS — I714 Abdominal aortic aneurysm, without rupture, unspecified: Secondary | ICD-10-CM | POA: Diagnosis present

## 2023-12-24 DIAGNOSIS — R410 Disorientation, unspecified: Secondary | ICD-10-CM | POA: Diagnosis not present

## 2023-12-24 DIAGNOSIS — I4819 Other persistent atrial fibrillation: Secondary | ICD-10-CM | POA: Diagnosis present

## 2023-12-24 DIAGNOSIS — Z8249 Family history of ischemic heart disease and other diseases of the circulatory system: Secondary | ICD-10-CM | POA: Diagnosis not present

## 2023-12-24 DIAGNOSIS — I160 Hypertensive urgency: Secondary | ICD-10-CM | POA: Diagnosis present

## 2023-12-24 DIAGNOSIS — F4310 Post-traumatic stress disorder, unspecified: Secondary | ICD-10-CM | POA: Diagnosis present

## 2023-12-24 DIAGNOSIS — R001 Bradycardia, unspecified: Secondary | ICD-10-CM | POA: Diagnosis not present

## 2023-12-24 DIAGNOSIS — I2489 Other forms of acute ischemic heart disease: Secondary | ICD-10-CM | POA: Diagnosis not present

## 2023-12-24 DIAGNOSIS — R7989 Other specified abnormal findings of blood chemistry: Secondary | ICD-10-CM | POA: Diagnosis not present

## 2023-12-24 DIAGNOSIS — E8809 Other disorders of plasma-protein metabolism, not elsewhere classified: Secondary | ICD-10-CM | POA: Diagnosis not present

## 2023-12-24 DIAGNOSIS — Z7189 Other specified counseling: Secondary | ICD-10-CM | POA: Diagnosis not present

## 2023-12-24 DIAGNOSIS — N179 Acute kidney failure, unspecified: Secondary | ICD-10-CM | POA: Diagnosis not present

## 2023-12-24 DIAGNOSIS — I951 Orthostatic hypotension: Secondary | ICD-10-CM | POA: Diagnosis present

## 2023-12-24 DIAGNOSIS — I674 Hypertensive encephalopathy: Secondary | ICD-10-CM | POA: Diagnosis present

## 2023-12-24 DIAGNOSIS — F0392 Unspecified dementia, unspecified severity, with psychotic disturbance: Secondary | ICD-10-CM | POA: Diagnosis present

## 2023-12-24 DIAGNOSIS — I495 Sick sinus syndrome: Secondary | ICD-10-CM | POA: Diagnosis present

## 2023-12-24 DIAGNOSIS — I129 Hypertensive chronic kidney disease with stage 1 through stage 4 chronic kidney disease, or unspecified chronic kidney disease: Secondary | ICD-10-CM | POA: Diagnosis present

## 2023-12-24 DIAGNOSIS — Z66 Do not resuscitate: Secondary | ICD-10-CM | POA: Diagnosis not present

## 2023-12-24 DIAGNOSIS — I2510 Atherosclerotic heart disease of native coronary artery without angina pectoris: Secondary | ICD-10-CM | POA: Diagnosis present

## 2023-12-24 LAB — CBC
HCT: 43.1 % (ref 36.0–46.0)
Hemoglobin: 14.5 g/dL (ref 12.0–15.0)
MCH: 30 pg (ref 26.0–34.0)
MCHC: 33.6 g/dL (ref 30.0–36.0)
MCV: 89 fL (ref 80.0–100.0)
Platelets: 245 10*3/uL (ref 150–400)
RBC: 4.84 MIL/uL (ref 3.87–5.11)
RDW: 14.2 % (ref 11.5–15.5)
WBC: 5.9 10*3/uL (ref 4.0–10.5)
nRBC: 0 % (ref 0.0–0.2)

## 2023-12-24 LAB — COMPREHENSIVE METABOLIC PANEL
ALT: 16 U/L (ref 0–44)
AST: 37 U/L (ref 15–41)
Albumin: 3.6 g/dL (ref 3.5–5.0)
Alkaline Phosphatase: 30 U/L — ABNORMAL LOW (ref 38–126)
Anion gap: 13 (ref 5–15)
BUN: 20 mg/dL (ref 8–23)
CO2: 23 mmol/L (ref 22–32)
Calcium: 9.8 mg/dL (ref 8.9–10.3)
Chloride: 104 mmol/L (ref 98–111)
Creatinine, Ser: 1.61 mg/dL — ABNORMAL HIGH (ref 0.44–1.00)
GFR, Estimated: 30 mL/min — ABNORMAL LOW (ref >60)
Glucose, Bld: 106 mg/dL — ABNORMAL HIGH (ref 70–99)
Potassium: 3.7 mmol/L (ref 3.5–5.1)
Sodium: 140 mmol/L (ref 135–145)
Total Bilirubin: 0.9 mg/dL (ref 0.0–1.2)
Total Protein: 5.9 g/dL — ABNORMAL LOW (ref 6.5–8.1)

## 2023-12-24 LAB — GLUCOSE, CAPILLARY
Glucose-Capillary: 123 mg/dL — ABNORMAL HIGH (ref 70–99)
Glucose-Capillary: 86 mg/dL (ref 70–99)

## 2023-12-24 MED ORDER — LORAZEPAM 2 MG/ML IJ SOLN: 1.0000 mg | Freq: Once | INTRAMUSCULAR | Status: DC | PRN

## 2023-12-24 MED ORDER — CARVEDILOL 3.125 MG PO TABS
3.1250 mg | ORAL_TABLET | Freq: Two times a day (BID) | ORAL | Status: DC
Start: 2023-12-24 — End: 2023-12-24
  Administered 2023-12-24: 3.125 mg via ORAL
  Filled 2023-12-24: qty 1

## 2023-12-24 MED ORDER — LORAZEPAM 1 MG PO TABS
1.0000 mg | ORAL_TABLET | Freq: Once | ORAL | Status: AC | PRN
Start: 2023-12-24 — End: 2023-12-24
  Administered 2023-12-24: 1 mg via ORAL
  Filled 2023-12-24: qty 1

## 2023-12-24 MED ORDER — QUETIAPINE FUMARATE 25 MG PO TABS
12.5000 mg | ORAL_TABLET | Freq: Every day | ORAL | Status: DC
  Administered 2023-12-24: 12.5 mg via ORAL
  Filled 2023-12-24: qty 1

## 2023-12-24 MED ORDER — HALOPERIDOL 1 MG PO TABS
1.0000 mg | ORAL_TABLET | Freq: Four times a day (QID) | ORAL | Status: DC | PRN
  Administered 2023-12-24 – 2023-12-25 (×2): 1 mg via ORAL
  Filled 2023-12-24 (×3): qty 1

## 2023-12-24 MED ORDER — ALBUTEROL SULFATE (2.5 MG/3ML) 0.083% IN NEBU: 3.0000 mL | INHALATION_SOLUTION | Freq: Four times a day (QID) | RESPIRATORY_TRACT | Status: DC | PRN

## 2023-12-24 MED ORDER — HALOPERIDOL LACTATE 5 MG/ML IJ SOLN
1.0000 mg | Freq: Four times a day (QID) | INTRAMUSCULAR | Status: DC | PRN
  Administered 2023-12-24: 1 mg via INTRAVENOUS
  Filled 2023-12-24: qty 1

## 2023-12-24 MED ORDER — METOPROLOL TARTRATE 12.5 MG HALF TABLET
12.5000 mg | ORAL_TABLET | Freq: Two times a day (BID) | ORAL | Status: DC
  Administered 2023-12-24 – 2023-12-25 (×3): 12.5 mg via ORAL
  Filled 2023-12-24 (×4): qty 1

## 2023-12-24 NOTE — Progress Notes (Addendum)
Gina Bond  ZOX:[id number] DOB: Nov 19, 1932 DOA: 12/23/2023 PCP: Verlon Au, MD    Brief Narrative:  89 year old with a history of HTN, HLD, CKD stage IIIb, CAD, AAA, COPD, and memory loss who presented to the ER 1/18 with increasing confusion agitation and hallucinations over the course of several days.  The patient herself had no complaints but her family had appreciated these changes.  Family reported no falls injuries or new medications.  In the ER CT head was negative for acute findings.  Goals of Care:   Code Status: Full Code   DVT prophylaxis: heparin injection 5,000 Units Start: 12/23/23 0600   Interim Hx: Telemetry monitoring suggested a possible episode of atrial fibrillation last night.  Frequent extra beats are appreciated on auscultation.  She is afebrile with stable vital signs.  She is alert at time of my visit but remains confused and is not able to provide a reliable history. She is pleasant but cannot tell me where she is and why she is here.  Her delirium persists and there is presently no clear acute etiology.  She is not responding to our interventions thus far with no improvement in her mental status being appreciated.  Assessment & Plan:  Acute v/s subacute encephalopathy of unclear etiology - acute delirium of unknown etiology not responding to treatment - possible hypertensive encephalopathy CT head unrevealing - no clinical evidence of acute infection - no focal neurologic deficits - B12 is >400 - TSH elevated but not markedly so and free T4 is normal - respiratory virus panel negative - UA unrevealing - UDS negative - alcohol level <10 -ammonia normal - obtain MRI brain to rule out PRES as well as cerebellar CVA   Uncontrolled hypertension - hypertensive urgency - possible hypertensive encephalopathy Blood pressure 217/93 at presentation -titration of medications is being carried out with addition of new medications - blood pressure slowly  improving as is desired  Possible newly diagnoses Afib v/s atrial ectopy Monitor on tele for now - check EKG - add BB to assist in rate control -given her altered mental status and attendant high risk for falls she is not a good candidate for anticoagulation at this time -magnesium is normal -there is no evidence of hyperthyroidism  CKD stage IIIb Baseline creatinine appears to be 1.4  COPD Well compensated presently  CAD / AAA Continue Plavix statin and nitrates  Family Communication: No family present at the time of my exam Disposition: Unclear presently, may require SNF stay   Objective: Blood pressure (!) 151/94, pulse 99, temperature 98.4 F (36.9 C), temperature source Oral, resp. rate 16, height 5\' 6"  (1.676 m), weight 69.3 kg, SpO2 92%.  Intake/Output Summary (Last 24 hours) at 12/24/2023 0739 Last data filed at 12/24/2023 0535 Gross per 24 hour  Intake --  Output 0 ml  Net 0 ml   Filed Weights   12/23/23 0150 12/23/23 1917  Weight: 65.8 kg 69.3 kg    Examination: General: No acute respiratory distress -confused but conversant Lungs: Clear to auscultation bilaterally without wheezes or crackles Cardiovascular: Regular rate -frequent ectopic beats -no appreciable murmur Abdomen: Nontender, nondistended, soft, bowel sounds positive, no rebound, no ascites, no appreciable mass Extremities: No significant cyanosis, clubbing, or edema bilateral lower extremities   CBC: Recent Labs  Lab 12/23/23 0145  WBC 5.5  NEUTROABS 3.0  HGB 13.8  HCT 41.8  MCV 91.3  PLT 212   Basic Metabolic Panel: Recent Labs  Lab 12/23/23 0145 12/23/23 0522  NA 142  --   K 3.7  --   CL 106  --   CO2 24  --   GLUCOSE 103*  --   BUN 24*  --   CREATININE 1.52*  --   CALCIUM 9.9  --   MG  --  1.9  PHOS  --  2.8   GFR: Estimated Creatinine Clearance: 23.5 mL/min (A) (by C-G formula based on SCr of 1.52 mg/dL (H)).   Scheduled Meds:  amLODipine  10 mg Oral Daily    clopidogrel  75 mg Oral Daily   docusate sodium  100 mg Oral BID   heparin  5,000 Units Subcutaneous Q8H   isosorbide-hydrALAZINE  2 tablet Oral TID   pantoprazole  40 mg Oral Daily   pravastatin  80 mg Oral q1800   senna  1 tablet Oral Q breakfast   sodium chloride flush  3 mL Intravenous Q12H     LOS: 0 days   Lonia Blood, MD [facility] Office  [phone] Pager - Text Page per Loretha Stapler  If 7PM-7AM, please contact night-coverage per Amion 12/24/2023, 7:39 AM

## 2023-12-24 NOTE — NC FL2 (Signed)
[place] MEDICAID FL2 LEVEL OF CARE FORM     IDENTIFICATION  Patient Name: Gina Bond Birthdate: 1932/03/31 Sex: female Admission Date (Current Location): 12/23/2023  [facility] and [place] Number:  Producer, television/film/video and Address:  [facility], [address] [place], [place] [address]      Provider Number: [id number]  Attending Physician Name and Address:  Lonia Blood, MD  Relative Name and Phone Number:       Current Level of Care:   Recommended Level of Care: Skilled Nursing Facility Prior Approval Number:    Date Approved/Denied:   PASRR Number: [id number]  Discharge Plan: SNF    Current Diagnoses: Patient Active Problem List   Diagnosis Date Noted   Acute encephalopathy 12/23/2023   Hypertensive crisis 12/23/2023   Acute delirium 12/23/2023   Memory loss 03/09/2023   Acute ST elevation myocardial infarction (STEMI) of anteroseptal wall (HCC) 02/15/2022   Unstable angina pectoris (HCC) 02/15/2022   GERD (gastroesophageal reflux disease) 02/02/2022   Depression 02/02/2022   Chest pain, rule out acute myocardial infarction 02/01/2022   Pulmonary hypertension with chronic cor pulmonale (HCC) 08/17/2021   Unspecified fracture of the lower end of left radius, initial encounter for closed fracture 08/17/2021   Basal cell carcinoma (BCC) of skin of nose 07/07/2020   Abdominal aortic aneurysm (HCC) 09/04/2018   Chronic constipation 06/15/2017   Diarrhea 03/17/2017   Right hip pain 05/26/2016   Hypoxia    Dehydration 12/06/2015   Coronary artery disease involving native coronary artery of native heart without angina pectoris 12/06/2015   Renal failure (ARF), acute on chronic (HCC)    Back pain    Dyspnea    Ileitis 07/24/2015   Chronic kidney disease, stage 3b (HCC) 07/24/2015   Breast pain, left 04/13/2015   Essential hypertension 12/10/2014   Unstable angina (HCC) 12/09/2014   Cramp of both lower extremities  08/18/2014   Mixed hyperlipidemia 03/10/2014   Pain in joint, pelvic region and thigh 03/10/2014   COPD (chronic obstructive pulmonary disease) (HCC) 09/13/2013   Osteoarthrosis 08/09/2013   Disorder of kidney and ureter 07/16/2013   Thoracic or lumbosacral neuritis or radiculitis 07/16/2013    Orientation RESPIRATION BLADDER Height & Weight      (disoriented x4)  Normal Incontinent Weight: 152 lb 12.5 oz (69.3 kg) Height:  5\' 6"  (167.6 cm)  BEHAVIORAL SYMPTOMS/MOOD NEUROLOGICAL BOWEL NUTRITION STATUS      Incontinent    AMBULATORY STATUS COMMUNICATION OF NEEDS Skin   Limited Assist Verbally Normal                       Personal Care Assistance Level of Assistance  Bathing, Feeding, Dressing Bathing Assistance: Limited assistance Feeding assistance: Limited assistance Dressing Assistance: Limited assistance     Functional Limitations Info  Speech, Hearing, Sight Sight Info: Adequate Hearing Info: Adequate Speech Info: Adequate    SPECIAL CARE FACTORS FREQUENCY  PT (By licensed PT), OT (By licensed OT)                    Contractures Contractures Info: Not present    Additional Factors Info  Code Status Code Status Info: FULL CODE             Current Medications (12/24/2023):  This is the current hospital active medication list Current Facility-Administered Medications  Medication Dose Route Frequency Provider Last Rate Last Admin   acetaminophen (TYLENOL) tablet 500 mg  500 mg Oral Q6H PRN Lonia Blood, MD       albuterol (PROVENTIL) (2.5 MG/3ML) 0.083% nebulizer solution 3 mL  3 mL Inhalation Q6H PRN Lonia Blood, MD       amLODipine (NORVASC) tablet 10 mg  10 mg Oral Daily Jetty Duhamel T, MD   10 mg at 12/24/23 0848   clopidogrel (PLAVIX) tablet 75 mg  75 mg Oral Daily Opyd, Lavone Neri, MD   75 mg at 12/24/23 0851   docusate sodium (COLACE) capsule 100 mg  100 mg Oral BID Jetty Duhamel T, MD   100 mg at 12/24/23 4098   haloperidol  lactate (HALDOL) injection 1 mg  1 mg Intravenous Q6H PRN Lonia Blood, MD   1 mg at 12/24/23 1191   Or   haloperidol (HALDOL) tablet 1 mg  1 mg Oral Q6H PRN Lonia Blood, MD   1 mg at 12/24/23 0858   heparin injection 5,000 Units  5,000 Units Subcutaneous Q8H Opyd, Lavone Neri, MD   5,000 Units at 12/24/23 1404   hydrALAZINE (APRESOLINE) tablet 50 mg  50 mg Oral Q6H PRN Opyd, Lavone Neri, MD       isosorbide-hydrALAZINE (BIDIL) 20-37.5 MG per tablet 2 tablet  2 tablet Oral TID Lonia Blood, MD   2 tablet at 12/24/23 1534   metoprolol tartrate (LOPRESSOR) tablet 12.5 mg  12.5 mg Oral BID Jetty Duhamel T, MD   12.5 mg at 12/24/23 1043   pantoprazole (PROTONIX) EC tablet 40 mg  40 mg Oral Daily Opyd, Lavone Neri, MD   40 mg at 12/24/23 0852   pravastatin (PRAVACHOL) tablet 80 mg  80 mg Oral q1800 Briscoe Deutscher, MD   80 mg at 12/23/23 2135   prochlorperazine (COMPAZINE) injection 5 mg  5 mg Intravenous Q6H PRN Opyd, Lavone Neri, MD       QUEtiapine (SEROQUEL) tablet 12.5 mg  12.5 mg Oral QHS Lonia Blood, MD       senna (SENOKOT) tablet 8.6 mg  1 tablet Oral Q breakfast Jetty Duhamel T, MD   8.6 mg at 12/24/23 4782   senna-docusate (Senokot-S) tablet 1 tablet  1 tablet Oral QHS PRN Opyd, Lavone Neri, MD       sodium chloride flush (NS) 0.9 % injection 3 mL  3 mL Intravenous Q12H Opyd, Lavone Neri, MD   3 mL at 12/24/23 9562     Discharge Medications: Please see discharge summary for a list of discharge medications.  Relevant Imaging Results:  Relevant Lab Results:   Additional Information SSN [id number]  Dellie Burns [place], [place]
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
## 2023-12-24 NOTE — Progress Notes (Signed)
Tele called and said patient went into A.fib. she was sitting at the nurses dext watching you tube. Denied any symptom. On-call notified but no new order. We continue to monitor.

## 2023-12-24 NOTE — Progress Notes (Signed)
Patient daughter called and was asking for MD to evaluate her mom for phych issue and also wanted someone to look into placement for her mom.

## 2023-12-25 ENCOUNTER — Inpatient Hospital Stay (HOSPITAL_COMMUNITY): Payer: Medicare Other

## 2023-12-25 DIAGNOSIS — I4891 Unspecified atrial fibrillation: Secondary | ICD-10-CM | POA: Diagnosis not present

## 2023-12-25 DIAGNOSIS — R7989 Other specified abnormal findings of blood chemistry: Secondary | ICD-10-CM

## 2023-12-25 DIAGNOSIS — I2489 Other forms of acute ischemic heart disease: Secondary | ICD-10-CM | POA: Diagnosis not present

## 2023-12-25 DIAGNOSIS — G9340 Encephalopathy, unspecified: Secondary | ICD-10-CM | POA: Diagnosis not present

## 2023-12-25 DIAGNOSIS — I480 Paroxysmal atrial fibrillation: Secondary | ICD-10-CM

## 2023-12-25 LAB — ECHOCARDIOGRAM COMPLETE
AR max vel: 1.06 cm2
AV Area VTI: 1.22 cm2
AV Area mean vel: 1.59 cm2
AV Mean grad: 11.4 mm[Hg]
AV Peak grad: 21.3 mm[Hg]
Ao pk vel: 2.31 m/s
Area-P 1/2: 2.76 cm2
Calc EF: 55.2 %
Est EF: 75
Height: 66 in
S' Lateral: 2.2 cm
Single Plane A2C EF: 59.9 %
Single Plane A4C EF: 61.9 %
Weight: 2444.46 [oz_av]

## 2023-12-25 LAB — TROPONIN I (HIGH SENSITIVITY)
Troponin I (High Sensitivity): 962 ng/L (ref <18)
Troponin I (High Sensitivity): 821 ng/L (ref <18)

## 2023-12-25 LAB — T3, FREE: T3, Free: 2.8 pg/mL (ref 2.0–4.4)

## 2023-12-25 MED ORDER — TUBERCULIN PPD 5 UNIT/0.1ML ID SOLN
5.0000 [IU] | Freq: Once | INTRADERMAL | Status: AC
  Administered 2023-12-25: 5 [IU] via INTRADERMAL
  Filled 2023-12-25: qty 0.1

## 2023-12-25 MED ORDER — APIXABAN 2.5 MG PO TABS
2.5000 mg | ORAL_TABLET | Freq: Two times a day (BID) | ORAL | Status: DC
  Administered 2023-12-25 – 2023-12-26 (×3): 2.5 mg via ORAL
  Filled 2023-12-25 (×3): qty 1

## 2023-12-25 MED ORDER — QUETIAPINE FUMARATE 25 MG PO TABS
12.5000 mg | ORAL_TABLET | Freq: Every day | ORAL | Status: DC
  Administered 2023-12-25: 12.5 mg via ORAL
  Filled 2023-12-25: qty 1

## 2023-12-25 MED ORDER — ISOSORB DINITRATE-HYDRALAZINE 20-37.5 MG PO TABS
1.0000 | ORAL_TABLET | Freq: Three times a day (TID) | ORAL | Status: DC
  Filled 2023-12-25: qty 1

## 2023-12-25 NOTE — Plan of Care (Signed)
  Problem: Education: Goal: Knowledge of General Education information will improve Description: Including pain rating scale, medication(s)/side effects and non-pharmacologic comfort measures Outcome: Progressing   Problem: Safety: Goal: Ability to remain free from injury will improve Outcome: Progressing   

## 2023-12-25 NOTE — Progress Notes (Signed)
Nurse Tech took patient to use the bathroom and she became very lethargic while sitting on the toilet and was responding only to voice. This RN was called into the room. Patient assisted into the bed and she started talking. Assessment done, vitals stable and she denied chest pain. MD notified, EKG done per order. Troponin level ordered and came back 962, MD notified. Troponin reordered and came back 821, MD notified again. No new order given. Patient seems stable and resting comfortably. We continue to monitor.

## 2023-12-25 NOTE — Progress Notes (Signed)
Gina Bond  ZOX:[id number] DOB: 12-Dec-1931 DOA: 12/23/2023 PCP: Verlon Au, MD    Brief Narrative:  89 year old with a history of HTN, HLD, CKD stage IIIb, CAD, AAA, COPD, and memory loss who presented to the ER 1/18 with increasing confusion agitation and hallucinations over the course of several days.  The patient herself had no complaints but her family had appreciated these changes.  Family reported no falls injuries or new medications.  In the ER CT head was negative for acute findings.  Goals of Care:   Code Status: Full Code   DVT prophylaxis: heparin injection 5,000 Units Start: 12/23/23 0600   Interim Hx: The patient had an episode of lethargy last night, likely related to sedating medications utilized to obtain an MRI of the brain.  At the time of my visit today she is alert and conversant but very agitated.  She tells me she wants to go home to [place].  Her speech is rambling and at times not pertinent to our discussion.  She is afebrile and her vitals are otherwise stable.  She was noted to be orthostatic on evaluation by her nurse today.  Assessment & Plan:  Acute v/s subacute encephalopathy of unclear etiology - acute delirium of unknown etiology not responding to treatment - possible hypertensive encephalopathy CT head unrevealing - no clinical evidence of acute infection - no focal neurologic deficits - B12 is >400 - TSH elevated but not markedly so and free T4 is normal - respiratory virus panel negative - UA unrevealing - UDS negative - alcohol level <10 - ammonia normal -RPR nonreactive - MRI brain notes no acute findings with age-related atrophy with mild chronic small vessel ischemic change and right mastoid effusion -exam of right ear reveals no evidence to suggest acute otitis media and patient has no symptoms to suggest this -I have spoken with the patient's daughter extensively -the description of the patient's stay at home suggest that she is  suffering with a progressive dementia -evaluation suggest this may be vascular in etiology -she is experiencing some hallucinations -the patient's daughter and I agree that she is no longer safe to live at home independently -the patient herself does not desire facility placement but she is no longer capable of making informed medical decisions given her cognitive decline -medical therapy will be initiated in an attempt to stabilize her mood  Right mastoid effusion Incidentally noted on MRI head - no clinical evidence to suggest active otitis media with otoscope exam 1/20 unrevealing - no signs of systemic infection  Uncontrolled hypertension -labile hypertension Blood pressure 217/93 at presentation -titration of medications was carried out with blood pressure slowly improving initially then rapidly changing to the point of relative hypotension with orthostatic symptoms - decrease blood pressure medications today and monitor trend with goal for permissive hypertension but SBP <180 -at this point I think hypertensive encephalopathy is not likely and therefore a little permissive hypertension is reasonable  Newly diagnosed Afib v/s atrial ectopy EKG consistent with true atrial fibrillation - added BB to assist in rate control -given her altered mental status and attendant high risk for falls she is not a good candidate for anticoagulation long-term, but I feel it is reasonable during her inpatient stay - magnesium is normal -there is no evidence of hyperthyroidism -Cardiology is following  Elevated troponin No clinical evidence to suggest ACS -likely simply related to atrial fibrillation or simply demand ischemia related to rate - no urgent coronary evaluation indicated per  Cardiology  CKD stage IIIb Baseline creatinine appears to be 1.4  COPD Well compensated presently  CAD / AAA Continue Plavix statin and nitrates  Family Communication: Spoke with the patient's daughter at length in the  hallway Disposition: Anticipate SNF rehab stay will be indicated   Objective: Blood pressure 103/66, pulse 94, temperature 98.2 F (36.8 C), temperature source Oral, resp. rate 19, height 5\' 6"  (1.676 m), weight 69.3 kg, SpO2 96%.  Intake/Output Summary (Last 24 hours) at 12/25/2023 0755 Last data filed at 12/24/2023 2322 Gross per 24 hour  Intake --  Output 0 ml  Net 0 ml   Filed Weights   12/23/23 0150 12/23/23 1917  Weight: 65.8 kg 69.3 kg    Examination: General: No acute respiratory distress -confused but conversant Lungs: Clear to auscultation bilaterally  Cardiovascular: Regular rate -frequent ectopic beats - no murmur Abdomen: Nontender, nondistended, soft, bowel sounds positive, no rebound Extremities: No significant edema bilateral lower extremities   CBC: Recent Labs  Lab 12/23/23 0145 12/24/23 1434  WBC 5.5 5.9  NEUTROABS 3.0  --   HGB 13.8 14.5  HCT 41.8 43.1  MCV 91.3 89.0  PLT 212 245   Basic Metabolic Panel: Recent Labs  Lab 12/23/23 0145 12/23/23 0522 12/24/23 1434  NA 142  --  140  K 3.7  --  3.7  CL 106  --  104  CO2 24  --  23  GLUCOSE 103*  --  106*  BUN 24*  --  20  CREATININE 1.52*  --  1.61*  CALCIUM 9.9  --  9.8  MG  --  1.9  --   PHOS  --  2.8  --    GFR: Estimated Creatinine Clearance: 22.2 mL/min (A) (by C-G formula based on SCr of 1.61 mg/dL (H)).   Scheduled Meds:  amLODipine  10 mg Oral Daily   clopidogrel  75 mg Oral Daily   docusate sodium  100 mg Oral BID   heparin  5,000 Units Subcutaneous Q8H   isosorbide-hydrALAZINE  2 tablet Oral TID   metoprolol tartrate  12.5 mg Oral BID   pantoprazole  40 mg Oral Daily   pravastatin  80 mg Oral q1800   senna  1 tablet Oral Q breakfast   sodium chloride flush  3 mL Intravenous Q12H     LOS: 1 day   Lonia Blood, MD [facility] Office  [phone] Pager - Text Page per Loretha Stapler  If 7PM-7AM, please contact night-coverage per Amion 12/25/2023, 7:55  AM

## 2023-12-25 NOTE — Progress Notes (Addendum)
Notified by RN that nurse tech took the patient to use the bathroom and she became very lethargic while she was sitting on the toilet.  She was not responding much at that time but was able to open her eyes.  After nursing staff moved her back to her bed, patient became more awake and alert and started talking.  RN concerned that she might have been too sedated as she received Ativan 1 mg for MRI at 3:34 PM and then had her scheduled Seroquel 12.5 mg at 10:12 PM.  Per RN, patient remained in sinus rhythm on telemetry at the time of this event and there was no call from CCMD reporting arrhythmia.  Her vital signs were checked and stable.  Temperature 98.2 F, pulse 94, respiratory rate 19, blood pressure 103/66, and SpO2 96% on room air.  Not hypoglycemic, CBG 123.  Brain MRI done today negative for stroke or acute intracranial abnormality.  Showing right mastoid effusion.  Plan: Seroquel discontinued and avoid any other sedating medications.  Stat EKG and troponin ordered.  Continue cardiac monitoring.  Addendum/update: Troponin 962.  EKG showing rate controlled A-fib versus ectopic atrial rhythm, no STEMI.  Patient is not having chest pain.  Since patient has known history of CAD, I discussed the case with cardiologist Dr. Orson Aloe who does not feel that this an acute cardiac event/ NSTEMI. Did not recommend starting IV heparin. Will continue to trend troponin.
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
## 2023-12-25 NOTE — Progress Notes (Signed)
Mobility Specialist Progress Note:    12/25/23 1400  Orthostatic Lying   BP- Lying 110/73  Orthostatic Sitting  BP- Sitting 127/85  Orthostatic Standing at 0 minutes  BP- Standing at 0 minutes 127/84  Mobility  Activity Ambulated with assistance to bathroom  Level of Assistance Contact guard assist, steadying assist  Assistive Device Front wheel walker  Distance Ambulated (ft) 30 ft  Activity Response Tolerated fair;Tolerated well  Mobility Referral Yes  Mobility visit 1 Mobility  Mobility Specialist Start Time (ACUTE ONLY) 1410  Mobility Specialist Stop Time (ACUTE ONLY) 1437  Mobility Specialist Time Calculation (min) (ACUTE ONLY) 27 min   Pt received in bed and agreeable. Orthostatic vitals taken, see above. Able to ambulate to BR w/ CG. Void successful. Upon standing from toilet, pt c/o dizziness and feeling "off". Took x1 seated rest break before returning to bed. Felt better once in supine position. Pt left in bed with call bell and all needs met. Bed alarm on.  Gina Bond Mobility Specialist Please contact via Special educational needs teacher or Rehab office at [phone]

## 2023-12-25 NOTE — Progress Notes (Signed)
  Echocardiogram 2D Echocardiogram has been performed.  Ocie Doyne RDCS 12/25/2023, 1:50 PM

## 2023-12-25 NOTE — Discharge Instructions (Signed)

## 2023-12-25 NOTE — TOC Progression Note (Addendum)
Transition of Care [facility]) - Progression Note    Patient Details  Name: Gina Bond MRN: [id number] Date of Birth: Mar 03, 1932  Transition of [facility]) CM/SW Contact  Michaela Corner, [place] Phone Number: 12/25/2023, 10:29 AM  Clinical Narrative:   CSW spoke with pts dtr, [name], regarding accepting bed offer at this time and verbally provided medicare.gov ratings. [name] states she wanted to look at [facility]. [facility] has declined and CSW expressed this to [facility]. [name] states she wants to talk about pts care level and look at other facilities for SNF. CSW has reached out to other pending bed offers to review referrals.  CSW informed MD and RN that pt wants to speak about pts care level.   Per MD: Possible dc Wednesday 1/22  10:39AM CSW spoke with Nelma Rothman from [facility] and facility will look over referral. Per French Ana w/ [facility], there is no bed availability at this time.   1:47PM: [name] called CSW regarding update on bed offers. CSW informed [name] that [name] accepted pt referral at this time. [name] wants some time to think about choice of SNF. Will need to follow up on choice of SNF at later time.   4:40PM: Karoline Caldwell has chosen [facility] for SNF at this time.   [name] also expressed interest in [facility] assisted living for once pt is done with short term rehab. CSW informed dtr that we cannot work on both processes for placement at this time, she will need to follow up with facility for further placement once dc from SNF.   Expected Discharge Plan and Services                                               Social Determinants of Health (SDOH) Interventions SDOH Screenings   Food Insecurity: Patient Unable To Answer (12/24/2023)  Housing: Patient Unable To Answer (12/24/2023)  Transportation Needs: Patient Unable To Answer (12/24/2023)  Utilities: Low Risk  (11/30/2023)   Received from [facility]  Social Connections: Patient Unable To Answer  (12/24/2023)  Tobacco Use: Medium Risk (12/23/2023)    Readmission Risk Interventions    08/20/2021    1:02 PM  Readmission Risk Prevention Plan  Transportation Screening Complete  PCP or Specialist Appt within 5-7 Days Complete  Home Care Screening Complete  Medication Review (RN CM) Complete

## 2023-12-26 ENCOUNTER — Other Ambulatory Visit (HOSPITAL_COMMUNITY): Payer: Medicare Other

## 2023-12-26 DIAGNOSIS — R7989 Other specified abnormal findings of blood chemistry: Secondary | ICD-10-CM | POA: Diagnosis not present

## 2023-12-26 DIAGNOSIS — I495 Sick sinus syndrome: Principal | ICD-10-CM

## 2023-12-26 DIAGNOSIS — G9340 Encephalopathy, unspecified: Secondary | ICD-10-CM | POA: Diagnosis not present

## 2023-12-26 DIAGNOSIS — I4891 Unspecified atrial fibrillation: Secondary | ICD-10-CM | POA: Diagnosis not present

## 2023-12-26 LAB — BASIC METABOLIC PANEL
Anion gap: 12 (ref 5–15)
BUN: 30 mg/dL — ABNORMAL HIGH (ref 8–23)
CO2: 23 mmol/L (ref 22–32)
Calcium: 9.5 mg/dL (ref 8.9–10.3)
Chloride: 103 mmol/L (ref 98–111)
Creatinine, Ser: 1.95 mg/dL — ABNORMAL HIGH (ref 0.44–1.00)
GFR, Estimated: 24 mL/min — ABNORMAL LOW (ref >60)
Glucose, Bld: 111 mg/dL — ABNORMAL HIGH (ref 70–99)
Potassium: 3.3 mmol/L — ABNORMAL LOW (ref 3.5–5.1)
Sodium: 138 mmol/L (ref 135–145)

## 2023-12-26 LAB — TROPONIN I (HIGH SENSITIVITY): Troponin I (High Sensitivity): 429 ng/L (ref <18)

## 2023-12-26 LAB — CBC
HCT: 43.7 % (ref 36.0–46.0)
Hemoglobin: 14.5 g/dL (ref 12.0–15.0)
MCH: 30 pg (ref 26.0–34.0)
MCHC: 33.2 g/dL (ref 30.0–36.0)
MCV: 90.3 fL (ref 80.0–100.0)
Platelets: 199 10*3/uL (ref 150–400)
RBC: 4.84 MIL/uL (ref 3.87–5.11)
RDW: 14.2 % (ref 11.5–15.5)
WBC: 6.2 10*3/uL (ref 4.0–10.5)
nRBC: 0 % (ref 0.0–0.2)

## 2023-12-26 MED ORDER — NITROGLYCERIN 0.4 MG SL SUBL: 0.4000 mg | SUBLINGUAL_TABLET | Freq: Once | SUBLINGUAL | Status: DC

## 2023-12-26 MED ORDER — POTASSIUM CHLORIDE CRYS ER 20 MEQ PO TBCR
40.0000 meq | EXTENDED_RELEASE_TABLET | Freq: Once | ORAL | Status: AC
  Administered 2023-12-26: 40 meq via ORAL
  Filled 2023-12-26: qty 2

## 2023-12-26 MED ORDER — HEPARIN (PORCINE) 25000 UT/250ML-% IV SOLN
900.0000 [IU]/h | INTRAVENOUS | Status: DC
Start: 2023-12-26 — End: 2023-12-28
  Administered 2023-12-26: 900 [IU]/h via INTRAVENOUS
  Administered 2023-12-27: 1150 [IU]/h via INTRAVENOUS
  Filled 2023-12-26 (×2): qty 250

## 2023-12-26 MED ORDER — LORAZEPAM 2 MG/ML IJ SOLN
0.5000 mg | Freq: Once | INTRAMUSCULAR | Status: AC | PRN
  Administered 2023-12-26: 0.5 mg via INTRAVENOUS
  Filled 2023-12-26: qty 1

## 2023-12-26 MED ORDER — SODIUM CHLORIDE 0.9 % IV SOLN: INTRAVENOUS | Status: AC

## 2023-12-26 MED ORDER — SODIUM CHLORIDE 0.9 % IV BOLUS
500.0000 mL | Freq: Once | INTRAVENOUS | Status: AC
  Administered 2023-12-26: 500 mL via INTRAVENOUS

## 2023-12-26 NOTE — Progress Notes (Signed)
Pt has had three episodes of bradycardia into 30's which is accompanied by chest pain on left side and sternal. First occurrence about 0730 while pt on toilet, rapid nurse came. Accompanied by drop in BP. 2nd occurrence mid morning occurred while pt ambulating c OT/PT with low BP. 3rd occurrence occurred c pt at rest, no drop in BP. Dr. Sharon Seller and Dr. Flora Lipps aware of occurences. Charge Nurse aware. Meds adjusted. Oxygen in place.

## 2023-12-26 NOTE — Progress Notes (Signed)
Per staff patient had gotten up and gone to the bathroom on her own and had a syncopal event in the bathroom.  BP 68/42  HR 28-30s.  Assisted back to bed.  BP 164/94  HR 92.  Upon my arrival she is at her neuro baseline.  She is alert, talkative, pleasantly confused.  Bed alarm active. RN to call if assistance needed.

## 2023-12-26 NOTE — Progress Notes (Signed)
Cardiology Progress Note  Patient ID: Gina Bond MRN: [id number] DOB: 01-22-1932 Date of Encounter: 12/26/2023 Primary Cardiologist: None  Subjective   Chief Complaint: None.   HPI: Several episodes of bradycardia and chest pain reported per nursing.  The patient reports no symptoms at the time my exam.  Her dementia is a concern.  ROS:  All other ROS reviewed and negative. Pertinent positives noted in the HPI.     Telemetry  Overnight telemetry shows A-fib heart rates 30 to 120 bpm, which I personally reviewed.   ECG  The most recent ECG shows A-fib heart rate 86, LVH by voltage, which I personally reviewed.   Physical Exam   Vitals:   12/26/23 1019 12/26/23 1031 12/26/23 1059 12/26/23 1148  BP: 123/75 (!) 151/104 139/84 138/73  Pulse: 63 84 74 67  Resp:      Temp:      TempSrc:      SpO2:  100%  100%  Weight:      Height:       No intake or output data in the 24 hours ending 12/26/23 1223     12/23/2023    7:17 PM 12/23/2023    1:50 AM 03/09/2023   11:13 AM  Last 3 Weights  Weight (lbs) 152 lb 12.5 oz 145 lb 136 lb 12.4 oz  Weight (kg) 69.3 kg 65.772 kg 62.04 kg    Body mass index is 24.66 kg/m.  General: Well nourished, well developed, in no acute distress Head: Atraumatic, normal size  Eyes: PEERLA, EOMI  Neck: Supple, no JVD Endocrine: No thryomegaly Cardiac: Normal S1, S2; irregular rhythm, no murmurs Lungs: Clear to auscultation bilaterally, no wheezing, rhonchi or rales  Abd: Soft, nontender, no hepatomegaly  Ext: No edema, pulses 2+ Musculoskeletal: No deformities, BUE and BLE strength normal and equal Skin: Warm and dry, no rashes   Neuro: Alert and oriented to person, place, time, and situation, CNII-XII grossly intact, no focal deficits  Psych: Normal mood and affect   Cardiac Studies  TTE 12/25/2023  1. Left ventricular ejection fraction, by estimation, is 75%. The left  ventricle has hyperdynamic function. The left ventricle has no  regional  wall motion abnormalities. There is mild left ventricular hypertrophy.  Left ventricular diastolic parameters  are indeterminate.   2. Right ventricular systolic function is mildly reduced. The right  ventricular size is normal. Tricuspid regurgitation signal is inadequate  for assessing PA pressure.   3. Right atrial size was mildly dilated.   4. The mitral valve is degenerative. Trivial mitral valve regurgitation.  No evidence of mitral stenosis. Moderate mitral annular calcification.   5. The aortic valve is abnormal and not well visualized. There is  moderate calcification of the aortic valve. Aortic valve regurgitation is  not visualized. Mild-moderate aortic valve stenosis. Aortic valve area, by  VTI measures 1.22 cm. Aortic valve  mean gradient measures 11.4 mmHg. Aortic valve Vmax measures 2.31 m/s. DVI  0.48, SVI 20. Findings may represent paradoxical low flow low gradient AS,  possibly moderate AS based on valve opening. Poor quality LVOT TVI makes  assessment challenging.   6. The inferior vena cava is normal in size with greater than 50%  respiratory variability, suggesting right atrial pressure of 3 mmHg.   Patient Profile  Gina Bond is a 89 y.o. female with CAD, memory loss (likely dementia), AAA (5.2 cm), CKD stage IIIb, hypertension, hyperlipidemia admitted on 12/23/2023 with increased agitation and hallucinations.  Cardiology consulted on  12/25/2023 for episode of nonresponsiveness and elevated troponins.  Assessment & Plan   # Episodes of unresponsiveness/lethargy # Atrial fibrillation with slow ventricular response # Sick sinus syndrome # Concerns for tachycardia-bradycardia syndrome  # Pre syncope -Admitted with agitation and memory impairment.  Concerns for delirium. -Newly diagnosed A-fib.  Has had slow ventricular response and sick sinus syndrome.  Staff have reported episodes of bradycardia which appears to be symptomatic.  She did receive  metoprolol to tartrate 12.5 mg last night but has not received any further beta-blocker. -Hold beta-blockers.  I will have her evaluated by electrophysiology today.  May need to consider pacemaker therapy. -Continue Eliquis 5 mg twice daily for now. -Given her memory impairment and agitation I do not believe TEE/cardioversion is warranted.  We will have EP weigh in on management recommendations.  # CAD # Elevated troponin, supply/demand mismatch -During her episode of unresponsiveness troponins were checked.  They were elevated but are trending down.  Echo shows normal LV function.  She reports no chest pain. -Suspect this is demand.  No plans for ischemia evaluation. -We stopped her home Plavix.  She is now on Eliquis. -Continue pravastatin.  # CKD stage IIIb # AKI -per hospital management  # AAA -5.2 cm in 2023 -re check [place] here  # HTN -labile. Close monitoring.        For questions or updates, please contact [facility] Please consult [email or web address] for contact info under    Signed, Gerri Spore T. Flora Lipps, MD, [facility]  [facility]  12/26/2023 12:23 PM

## 2023-12-26 NOTE — Progress Notes (Signed)
Gina Bond  AYT:[id number] DOB: 09/08/1932 DOA: 12/23/2023 PCP: Verlon Au, MD    Brief Narrative:  89 year old with a history of HTN, HLD, CKD stage IIIb, CAD, AAA, COPD, and memory loss who presented to the ER 1/18 with increasing confusion agitation and hallucinations over the course of several days.  The patient herself had no complaints but her family had appreciated these changes.  Family reported no falls injuries or new medications.  In the ER CT head was negative for acute findings.  Goals of Care:   Code Status: Full Code   DVT prophylaxis: apixaban (ELIQUIS) tablet 2.5 mg Start: 12/25/23 1315 apixaban (ELIQUIS) tablet 2.5 mg   Interim Hx: The patient suffered a recurrent episode while on the toilet this morning which sounds similar to the episode she had the night prior.  She was noted to have a heart rate dropping to the 30s with a systolic blood pressure of 68 and complaints of chest discomfort.  She was assisted back to bed and follow-up blood pressure had improved to 164/94, with concomitant improvement in her heart rate.  She did not lose consciousness.  At the time of my bedside visit she is resting comfortably in bed.  She is alert pleasant and enjoying a meal without difficulty.  Assessment & Plan:  Tachy/bradycardia syndrome The patient has had multiple episodes of increasing frequency over the last 48 hours during which her blood pressure and heart rate dropped and she became presyncopal with chest pain - I have discontinued any vasodilators that were being used for blood pressure control as well as negative chronotropic medications -TTE 1/20 noted EF 75% with no WMA and mild LV hypertrophy with mild to moderate aortic valve stenosis - EP has been asked to see the patient to consider pacemaker placement -see further discussion as per newly diagnosed A-fib  Newly diagnosed Afib  EKG consistent with true atrial fibrillation - given her altered mental  status and attendant high risk for falls she is not a good candidate for anticoagulation long-term in my opinion, but I feel it is reasonable during her inpatient stay - magnesium is normal -there is no evidence of hyperthyroidism -Cardiology is following -rate control will be difficult in setting of tachy/brady until she has a pacemaker   Acute v/s subacute encephalopathy of unclear etiology - acute delirium of unknown etiology not responding to treatment - possible hypertensive encephalopathy CT head unrevealing - no clinical evidence of acute infection - no focal neurologic deficits - B12 is >400 - TSH elevated but not markedly so and free T4 is normal - respiratory virus panel negative - UA unrevealing - UDS negative - alcohol level <10 - ammonia normal -RPR nonreactive - MRI brain notes no acute findings but did reveal age-related atrophy with mild chronic small vessel ischemic change and right mastoid effusion - exam of right ear reveals no evidence of acute otitis media and patient has no symptoms to suggest this -I have spoken with the patient's daughter extensively -the description of the patient's behavior at home suggest that she is suffering with a progressive dementia -evaluation suggest this may be vascular in etiology -she is experiencing some hallucinations -the patient's daughter and I agree that she is no longer safe to live at home independently -the patient herself does not desire facility placement but she is no longer capable of making informed medical decisions given her cognitive decline -medical therapy will be initiated in an attempt to stabilize her mood  Right  mastoid effusion Incidentally noted on MRI head - no clinical evidence to suggest active otitis media with otoscope exam 1/20 unrevealing - no signs of systemic infection  Uncontrolled hypertension -labile hypertension Blood pressure 217/93 at presentation -titration of medications was carried out with blood pressure slowly  improving initially then rapidly changing to the point of relative hypotension with orthostatic symptoms, accompanied and likely brought about by significant bradycardia - decreased blood pressure medications 1/20 - present goal is for permissive hypertension but SBP <180 -at this point I think hypertensive encephalopathy   Elevated troponin No clinical evidence to suggest ACS -likely simply related to atrial fibrillation and periods of severe bradycardia - no urgent coronary evaluation indicated per Cardiology  CKD stage IIIb Baseline creatinine appears to be 1.4 - creatinine steadily climbing - with episodes of orthostasis/bradycardia will hydrate gently and follow trend, being mindful of probable moderate AoS  Recent Labs  Lab 12/23/23 0145 12/24/23 1434 12/26/23 0433  CREATININE 1.52* 1.61* 1.95*     COPD Well compensated presently  CAD / AAA Continue Plavix statin and nitrates  Family Communication: No family present at time of exam today Disposition: Anticipate SNF rehab stay will be indicated   Objective: Blood pressure 136/88, pulse 80, temperature (!) 97.3 F (36.3 C), temperature source Oral, resp. rate 18, height 5\' 6"  (1.676 m), weight 69.3 kg, SpO2 99%. No intake or output data in the 24 hours ending 12/26/23 0928  Filed Weights   12/23/23 0150 12/23/23 1917  Weight: 65.8 kg 69.3 kg    Examination: General: No acute respiratory distress -confused but conversant and pleasant today Lungs: Clear to auscultation bilaterally  Cardiovascular: Irregularly irregular with controlled rate - no appreciable murmur Abdomen: Nontender, nondistended, soft, bowel sounds positive, no rebound Extremities: No significant edema bilateral lower extremities   CBC: Recent Labs  Lab 12/23/23 0145 12/24/23 1434 12/26/23 0433  WBC 5.5 5.9 6.2  NEUTROABS 3.0  --   --   HGB 13.8 14.5 14.5  HCT 41.8 43.1 43.7  MCV 91.3 89.0 90.3  PLT 212 245 199   Basic Metabolic  Panel: Recent Labs  Lab 12/23/23 0145 12/23/23 0522 12/24/23 1434 12/26/23 0433  NA 142  --  140 138  K 3.7  --  3.7 3.3*  CL 106  --  104 103  CO2 24  --  23 23  GLUCOSE 103*  --  106* 111*  BUN 24*  --  20 30*  CREATININE 1.52*  --  1.61* 1.95*  CALCIUM 9.9  --  9.8 9.5  MG  --  1.9  --   --   PHOS  --  2.8  --   --    GFR: Estimated Creatinine Clearance: 18.3 mL/min (A) (by C-G formula based on SCr of 1.95 mg/dL (H)).   Scheduled Meds:  apixaban  2.5 mg Oral BID   docusate sodium  100 mg Oral BID   metoprolol tartrate  12.5 mg Oral BID   pantoprazole  40 mg Oral Daily   pravastatin  80 mg Oral q1800   QUEtiapine  12.5 mg Oral QHS   senna  1 tablet Oral Q breakfast   sodium chloride flush  3 mL Intravenous Q12H   tuberculin  5 Units Intradermal Once     LOS: 2 days   Lonia Blood, MD [facility] Office  [phone] Pager - Text Page per Loretha Stapler  If 7PM-7AM, please contact night-coverage per Amion 12/26/2023, 9:28 AM

## 2023-12-26 NOTE — Progress Notes (Signed)
Mobility Specialist Progress Note:    12/26/23 1031  Therapy Vitals  Pulse Rate 84  BP (!) 151/104  Patient Position (if appropriate) Lying  Oxygen Therapy  SpO2 100 %  O2 Device Nasal Cannula  O2 Flow Rate (L/min) 2 L/min  Mobility  Activity Transferred from bed to chair  Level of Assistance Contact guard assist, steadying assist  Assistive Device Other (Comment) (HHA)  Distance Ambulated (ft) 4 ft  Activity Response Tolerated well  Mobility Referral Yes  Mobility visit 1 Mobility  Mobility Specialist Start Time (ACUTE ONLY) [id number]  Mobility Specialist Stop Time (ACUTE ONLY) 0858  Mobility Specialist Time Calculation (min) (ACUTE ONLY) 7 min   Pt received in bed and agreeable. Able to stand and take steps to chair w/ HHA CG. No complaints throughout. Pt left in chair with call bell and chair alarm on.  [place] Earlene Plater Mobility Specialist Please contact via Special educational needs teacher or Rehab office at [phone]
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
## 2023-12-26 NOTE — Plan of Care (Signed)
  Problem: Education: Goal: Knowledge of General Education information will improve Description: Including pain rating scale, medication(s)/side effects and non-pharmacologic comfort measures Outcome: Progressing   Problem: Health Behavior/Discharge Planning: Goal: Ability to manage health-related needs will improve Outcome: Progressing   Problem: Clinical Measurements: Goal: Will remain free from infection Outcome: Progressing Goal: Respiratory complications will improve Outcome: Progressing   Problem: Nutrition: Goal: Adequate nutrition will be maintained Outcome: Progressing   Problem: Coping: Goal: Level of anxiety will decrease Outcome: Progressing   Problem: Elimination: Goal: Will not experience complications related to bowel motility Outcome: Progressing Goal: Will not experience complications related to urinary retention Outcome: Progressing   Problem: Pain Managment: Goal: General experience of comfort will improve and/or be controlled Outcome: Progressing   Problem: Safety: Goal: Ability to remain free from injury will improve Outcome: Progressing   Problem: Skin Integrity: Goal: Risk for impaired skin integrity will decrease Outcome: Progressing  Bradycardia present, limiting activity for now

## 2023-12-26 NOTE — Progress Notes (Signed)
Pt has now have 4 episodes of bradycardia into 30's with chest pain since 0700. Last two occurring while pt was at rest, no syncope. Chest pain subsides in less than 10  minutes. Pt alert throughout. Pt transferred to [facility] and report called to [place] RN. [name], daughter, present and aware of new room. Pt brought no belongings.
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
## 2023-12-27 ENCOUNTER — Other Ambulatory Visit (HOSPITAL_COMMUNITY): Payer: Medicare Other

## 2023-12-27 DIAGNOSIS — I10 Essential (primary) hypertension: Secondary | ICD-10-CM

## 2023-12-27 DIAGNOSIS — I4891 Unspecified atrial fibrillation: Secondary | ICD-10-CM | POA: Diagnosis not present

## 2023-12-27 DIAGNOSIS — G9340 Encephalopathy, unspecified: Secondary | ICD-10-CM | POA: Diagnosis not present

## 2023-12-27 DIAGNOSIS — R410 Disorientation, unspecified: Secondary | ICD-10-CM | POA: Diagnosis not present

## 2023-12-27 DIAGNOSIS — Z515 Encounter for palliative care: Secondary | ICD-10-CM | POA: Diagnosis not present

## 2023-12-27 DIAGNOSIS — I169 Hypertensive crisis, unspecified: Secondary | ICD-10-CM | POA: Diagnosis not present

## 2023-12-27 DIAGNOSIS — Z7189 Other specified counseling: Secondary | ICD-10-CM | POA: Diagnosis not present

## 2023-12-27 DIAGNOSIS — R7989 Other specified abnormal findings of blood chemistry: Secondary | ICD-10-CM

## 2023-12-27 DIAGNOSIS — R001 Bradycardia, unspecified: Secondary | ICD-10-CM

## 2023-12-27 DIAGNOSIS — I2510 Atherosclerotic heart disease of native coronary artery without angina pectoris: Secondary | ICD-10-CM | POA: Diagnosis not present

## 2023-12-27 DIAGNOSIS — R413 Other amnesia: Secondary | ICD-10-CM | POA: Diagnosis not present

## 2023-12-27 LAB — PHOSPHORUS: Phosphorus: 2.9 mg/dL (ref 2.5–4.6)

## 2023-12-27 LAB — BASIC METABOLIC PANEL
Anion gap: 10 (ref 5–15)
BUN: 24 mg/dL — ABNORMAL HIGH (ref 8–23)
CO2: 24 mmol/L (ref 22–32)
Calcium: 9.3 mg/dL (ref 8.9–10.3)
Chloride: 107 mmol/L (ref 98–111)
Creatinine, Ser: 1.42 mg/dL — ABNORMAL HIGH (ref 0.44–1.00)
GFR, Estimated: 35 mL/min — ABNORMAL LOW (ref >60)
Glucose, Bld: 112 mg/dL — ABNORMAL HIGH (ref 70–99)
Potassium: 4.5 mmol/L (ref 3.5–5.1)
Sodium: 141 mmol/L (ref 135–145)

## 2023-12-27 LAB — CBC
HCT: 42.6 % (ref 36.0–46.0)
Hemoglobin: 13.8 g/dL (ref 12.0–15.0)
MCH: 29.9 pg (ref 26.0–34.0)
MCHC: 32.4 g/dL (ref 30.0–36.0)
MCV: 92.2 fL (ref 80.0–100.0)
Platelets: 205 K/uL (ref 150–400)
RBC: 4.62 MIL/uL (ref 3.87–5.11)
RDW: 14.2 % (ref 11.5–15.5)
WBC: 5.2 K/uL (ref 4.0–10.5)
nRBC: 0 % (ref 0.0–0.2)

## 2023-12-27 LAB — HEPATIC FUNCTION PANEL
ALT: 19 U/L (ref 0–44)
AST: 30 U/L (ref 15–41)
Albumin: 3.3 g/dL — ABNORMAL LOW (ref 3.5–5.0)
Alkaline Phosphatase: 30 U/L — ABNORMAL LOW (ref 38–126)
Bilirubin, Direct: 0.2 mg/dL (ref 0.0–0.2)
Indirect Bilirubin: 0.4 mg/dL (ref 0.3–0.9)
Total Bilirubin: 0.6 mg/dL (ref 0.0–1.2)
Total Protein: 5.7 g/dL — ABNORMAL LOW (ref 6.5–8.1)

## 2023-12-27 LAB — HEPARIN LEVEL (UNFRACTIONATED): Heparin Unfractionated: >1.1 [IU]/mL — ABNORMAL HIGH (ref 0.30–0.70)

## 2023-12-27 LAB — APTT
aPTT: 57 s — ABNORMAL HIGH (ref 24–36)
aPTT: 62 s — ABNORMAL HIGH (ref 24–36)

## 2023-12-27 LAB — MAGNESIUM: Magnesium: 1.7 mg/dL (ref 1.7–2.4)

## 2023-12-27 MED ORDER — AMLODIPINE BESYLATE 5 MG PO TABS
5.0000 mg | ORAL_TABLET | Freq: Every day | ORAL | Status: DC
  Administered 2023-12-27 – 2023-12-29 (×3): 5 mg via ORAL
  Filled 2023-12-27 (×3): qty 1

## 2023-12-27 MED ORDER — ISOSORBIDE MONONITRATE ER 30 MG PO TB24
30.0000 mg | ORAL_TABLET | Freq: Every day | ORAL | Status: DC
  Administered 2023-12-27 – 2023-12-29 (×3): 30 mg via ORAL
  Filled 2023-12-27 (×3): qty 1

## 2023-12-27 MED ORDER — ATROPINE SULFATE 1 MG/10ML IJ SOSY
1.0000 mg | PREFILLED_SYRINGE | INTRAMUSCULAR | Status: DC | PRN
  Administered 2023-12-27: 1 mg via INTRAVENOUS

## 2023-12-27 MED ORDER — HEPARIN BOLUS VIA INFUSION
900.0000 [IU] | Freq: Once | INTRAVENOUS | Status: AC
  Administered 2023-12-27: 900 [IU] via INTRAVENOUS
  Filled 2023-12-27: qty 900

## 2023-12-27 MED ORDER — NITROGLYCERIN 0.4 MG SL SUBL
SUBLINGUAL_TABLET | SUBLINGUAL | Status: AC
  Administered 2023-12-27: 0.4 mg
  Filled 2023-12-27: qty 1

## 2023-12-27 MED ORDER — HYDRALAZINE HCL 20 MG/ML IJ SOLN
10.0000 mg | Freq: Four times a day (QID) | INTRAMUSCULAR | Status: DC | PRN
Start: 2023-12-27 — End: 2023-12-29
  Administered 2023-12-28: 10 mg via INTRAVENOUS
  Filled 2023-12-27: qty 1

## 2023-12-27 NOTE — Progress Notes (Signed)
Cardiology Progress Note  Patient ID: Gina Bond MRN: [id number] DOB: 1932-07-15 Date of Encounter: 12/27/2023 Primary Cardiologist: None  Subjective   Chief Complaint: Confusion noted.   HPI: CP overnight with EKG changes. Confused today.  Continues to have chest pain.  Continues to have chest pain.  ROS:  All other ROS reviewed and negative. Pertinent positives noted in the HPI.     Telemetry  Overnight telemetry shows A-fib 30 to 90 bpm, which I personally reviewed.    Physical Exam   Vitals:   12/26/23 2326 12/27/23 0300 12/27/23 0421 12/27/23 0605  BP: (!) 166/107  (!) 153/112 (!) 155/114  Pulse:      Resp: 20     Temp: 98.5 F (36.9 C)     TempSrc: Axillary     SpO2: 96%  98% 99%  Weight:  65.2 kg    Height:        Intake/Output Summary (Last 24 hours) at 12/27/2023 0827 Last data filed at 12/27/2023 0023 Gross per 24 hour  Intake 337.8 ml  Output 350 ml  Net -12.2 ml       12/27/2023    3:00 AM 12/23/2023    7:17 PM 12/23/2023    1:50 AM  Last 3 Weights  Weight (lbs) 143 lb 11.8 oz 152 lb 12.5 oz 145 lb  Weight (kg) 65.2 kg 69.3 kg 65.772 kg    Body mass index is 23.2 kg/m.  General: Confused oriented to person only Head: Atraumatic, normal size  Eyes: PEERLA, EOMI  Neck: Supple, no JVD Endocrine: No thryomegaly Cardiac: Normal S1, S2; irregular rhythm 2 out of 6 systolic ejection murmur Lungs: Clear to auscultation bilaterally, no wheezing, rhonchi or rales  Abd: Soft, nontender, no hepatomegaly  Ext: No edema, pulses 2+ Musculoskeletal: No deformities, BUE and BLE strength normal and equal Skin: Warm and dry, no rashes   Neuro: Alert and oriented to person, place, time, and situation, CNII-XII grossly intact, no focal deficits  Psych: Normal mood and affect   Cardiac Studies  TTE 12/25/2023  1. Left ventricular ejection fraction, by estimation, is 75%. The left  ventricle has hyperdynamic function. The left ventricle has no regional   wall motion abnormalities. There is mild left ventricular hypertrophy.  Left ventricular diastolic parameters  are indeterminate.   2. Right ventricular systolic function is mildly reduced. The right  ventricular size is normal. Tricuspid regurgitation signal is inadequate  for assessing PA pressure.   3. Right atrial size was mildly dilated.   4. The mitral valve is degenerative. Trivial mitral valve regurgitation.  No evidence of mitral stenosis. Moderate mitral annular calcification.   5. The aortic valve is abnormal and not well visualized. There is  moderate calcification of the aortic valve. Aortic valve regurgitation is  not visualized. Mild-moderate aortic valve stenosis. Aortic valve area, by  VTI measures 1.22 cm. Aortic valve  mean gradient measures 11.4 mmHg. Aortic valve Vmax measures 2.31 m/s. DVI  0.48, SVI 20. Findings may represent paradoxical low flow low gradient AS,  possibly moderate AS based on valve opening. Poor quality LVOT TVI makes  assessment challenging.   6. The inferior vena cava is normal in size with greater than 50%  respiratory variability, suggesting right atrial pressure of 3 mmHg.   Patient Profile  Gina Bond is a 89 y.o. female with CAD, memory loss (likely dementia), AAA (5.2 cm), CKD stage IIIb, hypertension, hyperlipidemia admitted on 12/23/2023 with increased agitation and hallucinations.  Cardiology  consulted on 12/25/2023 for episode of nonresponsiveness and elevated troponins.   Assessment & Plan   # Chest pain # CAD # Dynamic EKG changes # Supply/demand mismatch, demand ischemia -Continues to have episodes of chest discomfort associated with bradycardia.  Had profound EKG changes. -She is very delirious and I did discuss with the daughter that I would recommend a left heart catheterization given EKG changes but I do not believe the patient cooperate with this. -Serum creatinine is improved.  This would not preclude her from  cath.  However I believe her mental status would. -We will tentatively plan for a left heart catheterization pending improvement in her mental status.  She is also being considered for a pacemaker.  If she cannot cooperate for left heart catheterization I do not believe she will cooperate for a pacemaker. -In the meantime we will add Imdur 30 mg daily and Norvasc 5 mg daily. -Echo was reassuring with normal LV function and no wall motion abnormality. -We will have palliative care see her.  If she cannot cooperate with a cath would recommend hospice care.  Discussed this with the daughter by phone.  She is in agreement but needs guidance at this time and her mother's life. -Not on aspirin as she is on heparin drip.  Will need anticoagulation for A-fib.  Continue pravastatin.  Has several statin allergies. -No beta-blocker or AV nodal agents.  # Tachycardia-bradycardia syndrome # Persistent atrial fibrillation # Symptomatic bradycardia -Continues to have episodes of unresponsiveness associated with A-fib with slow ventricular response.  Being considered for pacemaker.  See discussion above on chest pain and elevated troponins. -considering ppm but may not be a candidate if not a cath candidate.  -continue heparin for now.  -goals of care as above  # Goals of care -dementia and with delirium. Does not appear to be able to participate with left heart cath. Palliative care consult. Med management for now.   # CKD IIIB # AKI -stable  # AAA -5.2 cm in 2023  # HTN -labile. Adding imdur and norvasc.        For questions or updates, please contact [facility] Please consult [email or web address] for contact info under     Signed, Gerri Spore T. Flora Lipps, MD, [facility]   [facility]  12/27/2023 8:27 AM

## 2023-12-27 NOTE — Progress Notes (Signed)
Discussed with patient and daughter that we will not proceed with left heart catheterization today.  Ms. Stien is still quite confused and has difficulty following commands.  I am a bit concerned about her ability to tolerate a left heart catheterization.  We will pause on that and have palliative care consult to address goals of care.  Her symptoms of chest discomfort largely occur when she is having bradycardia and she did have dynamic EKG changes.  I suspect she may have underlying obstructive CAD but I am uncertain of her ability to tolerate left heart catheterization and aggressive cardiovascular care.  My recommendation would be palliative care with likely transition to hospice care.  However the daughter would like to hear more about this from the palliative care team.  We can see how she does.  In the interim I have added Imdur 30 mg daily amlodipine 5 mg daily.  Hydralazine as needed for systolic blood pressures over 782 has been added.  Cardiology will follow back up tomorrow.  Please notify us if she has more episodes of bradycardia and chest pain.  We will do our best to medically manage this.  Gerri Spore T. Flora Lipps, MD, [facility]  [facility]  [address], [address] [place], [place] [address] [phone]  10:47 AM

## 2023-12-27 NOTE — Significant Event (Signed)
Rapid Response Event Note   Reason for Call : CP with STE Initial Focused Assessment:  I was notified by nursing staff pt was c/o CP. EKG showed STE in anterolateral leads. SL NTG given for CP. EKG repeated and STE resolved. Afib.   0605-98.21F, HR 91 afib, 155/114 (128) , RR 16 with sats 99% on 2L [place].    Interventions:  -12 lead EKG -SL NTG x 1  MD Notified: Dr. Loney Loh and Dr. Piedad Climes per primary RN Call Time: [phone] Arrival Time: 0600 End Time: 0620  Rose Fillers, RN

## 2023-12-27 NOTE — Progress Notes (Signed)
Pt c/o chest pain and was very hypertensive with SBP 193. Pt given 3 SL nitroglycerine and pt states that she no longer has chest pain. EKG appears positive (please see Rapid response RN note. Providers called and no further orders given.
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
## 2023-12-27 NOTE — Significant Event (Signed)
Rapid Response Event Note   Reason for Call : Symptomatic bradycardia with CP  Initial Focused Assessment:  I was notified by nursing staff of pt with HR 28-30. Upon arrival, pt was arousable. Afib 28, BP 48/33, skin cool and dusky. Atropine 1mg  IV given. Pts HR came up to 90s afib. Pt c/o CP with left arm radiation. 12 lead EKG done. Dr. Loney Loh notified and instructed nursing staff to notify Cardiology. Dr. Piedad Climes came to bedside. Pt had 4 previous similar episodes within the last 24 hours. Pt should be monitored in the ICU until pacemaker if aggressive medical support is the plan of care.   0420-98.61F, HR 96, BP 153/112 (126), RR 16 with sats 98% on 4L[place].    Interventions:  -Atropine 1mg  IV -12 lead EKG (Afib) -Zoll Pads placed on pt  MD Notified: Dr. Loney Loh per primary RN. Cardiology notified and came to bedside.  Call Time: 0347 Arrival Time: 0350 End Time: 0430  Rose Fillers, RN

## 2023-12-27 NOTE — Hospital Course (Addendum)
The patient is an elderly 89 year old Caucasian female with a past medical history significant for but not limited to history of hypertension, hyperlipidemia, CKD stage IIIb, CAD, AAA, COPD, history of memory loss and other comorbidities who presented to the ED on 12/22/2022 with increasing confusion and agitation as well as associated hallucinations for last few days.  The patient herself had no complaints per family and appreciated the changes and family reported no falls or injuries or any medications.  In the ED head CT was negative for acute findings.  While she was hospitalized she episodes of chest pain and rapid heart rate and was foun to have an elevated troponin which trended up to 962.  Cardiology was consulted and she continued to have episodes of unresponsiveness and lethargy and also found to have atrial fibrillation with slow ventricular response with concerns for tachycardia-bradycardia syndrome.  Cardiology now feels that she may not be able to tolerate a cardiac cath given her confusion.  They recommended palliative consultation and palliative is now evaluated and they are recommending hospice care as this is the recommendation of the cardiologist as well.  Patient does not meet inpatient residential hospice criteria as she has a prognosis deemed to be greater than 2 weeks.  She will go to SNF with hospice at this time and patient's daughter is in agreement.  She is medically stable transferred at this time and will have further care per hospice protocol at the SNF.  Assessment and Plan:  Tachy/Bradycardia Syndrome Symptomatic Bradycardia -The patient has had multiple episodes of increasing frequency over the last 48 hours during which her blood pressure and heart rate dropped and she became presyncopal with chest pain - -Had discontinued any vasodilators that were being used for blood pressure control as well as negative chronotropic medications  -TTE 1/20 noted EF 75% with no WMA and mild  LV hypertrophy with mild to moderate aortic valve stenosis  -EP has been asked to see the patient to consider pacemaker placement -see further discussion as per newly diagnosed A-fib -Cards was to do a cardiac cath prior to EP evaluation but they do not feel that she will tolerate this given her ability to follow commands -He has had episodes of unresponsiveness associated with her A-fib with slow ventricular response and currently is being considered for pacemaker but may not be a candidate if she is not a cath candidate -Mains anticoag with heparin drip and will continue to have goals of care discussion palliative consulted for further evaluation -Palliative spoke with daughter. DNR decided. Continue management with medications. Needs placement with hospice.  And the family is aware of this and will be private pay and per palliative after the discussion with the daughter, the daughter is very realistic and just wants her mother comfortable and safe. She has experience with hospice with other family members.  -Patient is to pursue hospice care with SNF placement as she is not a candidate for residential hospice given that she is greater than 2 weeks likely and now has a bed available   Newly diagnosed Afib  -EKG consistent with true atrial fibrillation - given her altered mental status and attendant high risk for falls she is not a good candidate for anticoagulation long-term, but I feel it is reasonable during her inpatient stay  -Magnesium is normal -there is no evidence of hyperthyroidism -Cardiology is following -rate control will be difficult in setting of tachy/brady until she has a pacemaker pacemaker will not be pursued given that the  plan is for hospice care SNF   Acute v/s subacute encephalopathy of unclear etiology - acute delirium of unknown etiology not responding to treatment - possible hypertensive encephalopathy but most likely she has vascular dementia -CT head unrevealing - no  clinical evidence of acute infection - no focal neurologic deficits - B12 is >400 -  -TSH elevated but not markedly so and free T4 is normal - respiratory virus panel negative -  -UA unrevealing - UDS negative - alcohol level <10 - ammonia normal -RPR nonreactive  - MRI brain notes no acute findings but did reveal age-related atrophy with mild chronic small vessel ischemic change and right mastoid effusion - exam of right ear reveals no evidence of acute otitis media and patient has no symptoms to suggest this - -Dr. Sharon Seller had spoken with the patient's daughter extensively -the description of the patient's behavior at home suggest that she is suffering with a progressive dementia -evaluation suggest this may be vascular in etiology  -She is experiencing some hallucinations -the patient's daughter and I agree that she is no longer safe to live at home independently  -The patient herself does not desire facility placement but she is no longer capable of making informed medical decisions given her cognitive decline -medical therapy will be initiated in an attempt to stabilize her mood -Palliative Care Consulted for GOC Discussion and plan is for hospice care and pursuing placement with hospice support at SNF   Right mastoid effusion -Incidentally noted on MRI head - no clinical evidence to suggest active otitis media with otoscope exam 1/20 unrevealing - no signs of systemic infection -Not workup now that plan is for hospice care and pursuing placement with hospice support   Uncontrolled Hypertension  Labile Hypertension -Blood pressure 217/93 at presentation -titration of medications was carried out with blood pressure slowly improving initially then rapidly changing to the point of relative hypotension with orthostatic symptoms, accompanied and likely brought about by significant bradycardia  -decreased blood pressure medications 1/20 - present goal is for permissive hypertension but SBP <180 -at  this point I think hypertensive encephalopathy  -Continue monitor blood pressures per protocol -Last blood pressure reading is now 146/109   NSTEMI with Elevated Troponin and Chest Discomfort -Likely related to atrial fibrillation and periods of severe bradycardia -Plan was for cardiac catheterization but cardiology is concerned about her ability to tolerate left heart cath and have held off for now and recommending palliative consult for goals of care discussion -Cardiology feels that she may have underlying obstructive CAD but given confusion and difficulty following commands unlikely able to tolerate left heart cath and aggressive cardiovascular care -Cardiology recommending transitioning to hospice care -Troponin went from 821 -> 429 -In the interim cardiology added Imdur 30 mg p.o. daily, amlodipine 5 mg p.o. daily -They have also added hydralazine as needed for systolic blood pressure in 180 -If she continues to have episodes of bradycardia and chest pain cardiology recommends to notify them but they have signed off the case as the plan is for hospice care now and recommending continuing Imdur 30 mg p.o. daily and Norvasc 5 mg p.o. daily   CKD stage IIIb, improved  -Baseline creatinine appears to be 1.4 - creatinine steadily climbing - with episodes of orthostasis/bradycardia will hydrate gently and follow trend, being mindful of probable moderate AoS -BUN/Cr Trend: Recent Labs  Lab 12/23/23 0145 12/24/23 1434 12/26/23 0433 12/27/23 0749 12/28/23 0249  BUN 24* 20 30* 24* 22  CREATININE 1.52* 1.61* 1.95*  1.42* 1.51*  -Avoid Nephrotoxic Medications, Contrast Dyes, Hypotension and Dehydration to Ensure Adequate Renal Perfusion and will need to Renally Adjust Meds -Continue to Monitor and Trend Renal Function carefully and will not repeat now that she is going more of Comfort focused   COPD -Well compensated presently SpO2: 96 % O2 Flow Rate (L/min): 2 L/min  CAD / AAA -Continue  Plavix statin and nitrates -AAA was 5.2 cm in 2023   Hypoalbuminemia -Patient's Albumin Trend: Recent Labs  Lab 12/23/23 0145 12/24/23 1434 12/27/23 1014 12/28/23 0249  ALBUMIN 4.1 3.6 3.3* 3.4*  -Continue to Monitor and Trend and repeat CMP in the AM
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
## 2023-12-27 NOTE — Progress Notes (Signed)
Rounding Note    Patient Name: Gina Bond Date of Encounter: 12/27/2023  [facility] Cardiologist: None   Subjective   Reports feeling well, denies any CP, SOB, cardiac awareness Pt is cooperative and calm, answers name/ birth date, president correctly Unable to tell me where she was or the year Discussion is tangential, seems to confabulate some, reminisces about work she did and coworkers.  Inpatient Medications    Scheduled Meds:  amLODipine  5 mg Oral Daily   docusate sodium  100 mg Oral BID   heparin  900 Units Intravenous Once   isosorbide mononitrate  30 mg Oral Daily   pantoprazole  40 mg Oral Daily   pravastatin  80 mg Oral q1800   senna  1 tablet Oral Q breakfast   sodium chloride flush  3 mL Intravenous Q12H   tuberculin  5 Units Intradermal Once   Continuous Infusions:  heparin 900 Units/hr (12/26/23 2207)   PRN Meds: acetaminophen, atropine, prochlorperazine, senna-docusate   Vital Signs    Vitals:   12/27/23 0421 12/27/23 0605 12/27/23 0752 12/27/23 0753  BP: (!) 153/112 (!) 155/114 (!) 188/126 (!) 191/143  Pulse:   78   Resp:   18   Temp:   98.5 F (36.9 C)   TempSrc:   Axillary   SpO2: 98% 99% 93% 98%  Weight:      Height:        Intake/Output Summary (Last 24 hours) at 12/27/2023 0957 Last data filed at 12/27/2023 0023 Gross per 24 hour  Intake 337.8 ml  Output 350 ml  Net -12.2 ml      12/27/2023    3:00 AM 12/23/2023    7:17 PM 12/23/2023    1:50 AM  Last 3 Weights  Weight (lbs) 143 lb 11.8 oz 152 lb 12.5 oz 145 lb  Weight (kg) 65.2 kg 69.3 kg 65.772 kg      Telemetry    Afib 90's, did have marked bradycardia early AM hours to 29-30's - Personally Reviewed  ECG    Afib  89bpm, ST elevations 1-3, >> markedly improved in f/u EKG - Personally Reviewed  Physical Exam   GEN: No acute distress.   Neck: No JVD Cardiac: irreg-irreg, no murmurs, rubs, or gallops.  Respiratory: CTA b/l. GI: Soft, nontender,  non-distended  MS: No edema; No deformity. Neuro:  Nonfocal, AAO x2, somewhat confused Psych: Normal affect, pleasant and cooperative,   Labs    High Sensitivity Troponin:   Recent Labs  Lab 12/23/23 0145 12/23/23 0328 12/25/23 0033 12/25/23 0221 12/26/23 1037  TROPONINIHS 17 19* 962* 821* 429*     Chemistry Recent Labs  Lab 12/23/23 0145 12/23/23 0522 12/24/23 1434 12/26/23 0433 12/27/23 0749  NA 142  --  140 138 141  K 3.7  --  3.7 3.3* 4.5  CL 106  --  104 103 107  CO2 24  --  23 23 24   GLUCOSE 103*  --  106* 111* 112*  BUN 24*  --  20 30* 24*  CREATININE 1.52*  --  1.61* 1.95* 1.42*  CALCIUM 9.9  --  9.8 9.5 9.3  MG  --  1.9  --   --   --   PROT 6.5  --  5.9*  --   --   ALBUMIN 4.1  --  3.6  --   --   AST 25  --  37  --   --   ALT 14  --  16  --   --   ALKPHOS 32*  --  30*  --   --   BILITOT 0.7  --  0.9  --   --   GFRNONAA 33*  --  30* 24* 35*  ANIONGAP 12  --  13 12 10     Lipids No results for input(s): "CHOL", "TRIG", "HDL", "LABVLDL", "LDLCALC", "CHOLHDL" in the last 168 hours.  Hematology Recent Labs  Lab 12/24/23 1434 12/26/23 0433 12/27/23 0749  WBC 5.9 6.2 5.2  RBC 4.84 4.84 4.62  HGB 14.5 14.5 13.8  HCT 43.1 43.7 42.6  MCV 89.0 90.3 92.2  MCH 30.0 30.0 29.9  MCHC 33.6 33.2 32.4  RDW 14.2 14.2 14.2  PLT 245 199 205   Thyroid  Recent Labs  Lab 12/23/23 0522  TSH 5.456*  FREET4 1.01    BNPNo results for input(s): "BNP", "PROBNP" in the last 168 hours.  DDimer No results for input(s): "DDIMER" in the last 168 hours.   Radiology      Cardiac Studies   12/25/23: TTE  1. Left ventricular ejection fraction, by estimation, is 75%. The left  ventricle has hyperdynamic function. The left ventricle has no regional  wall motion abnormalities. There is mild left ventricular hypertrophy.  Left ventricular diastolic parameters  are indeterminate.   2. Right ventricular systolic function is mildly reduced. The right  ventricular size is  normal. Tricuspid regurgitation signal is inadequate  for assessing PA pressure.   3. Right atrial size was mildly dilated.   4. The mitral valve is degenerative. Trivial mitral valve regurgitation.  No evidence of mitral stenosis. Moderate mitral annular calcification.   5. The aortic valve is abnormal and not well visualized. There is  moderate calcification of the aortic valve. Aortic valve regurgitation is  not visualized. Mild-moderate aortic valve stenosis. Aortic valve area, by  VTI measures 1.22 cm. Aortic valve  mean gradient measures 11.4 mmHg. Aortic valve Vmax measures 2.31 m/s. DVI  0.48, SVI 20. Findings may represent paradoxical low flow low gradient AS,  possibly moderate AS based on valve opening. Poor quality LVOT TVI makes  assessment challenging.   6. The inferior vena cava is normal in size with greater than 50%  respiratory variability, suggesting right atrial pressure of 3 mmHg.    Abdominal duplex ultrasound 09/21/22: Abdominal Aortic Duplex 09/21/2022: Severe dilatation of the abdominal aorta is noted in the mid aorta. An abdominal aortic aneurysm measuring 5.1 x 5 x 5.2 cm is seen. Abdominal aortic aneurysm observed. Compared to the study done on 08/11/2021, there is progression of AAA's from 4.6 cm to the present >5 cm, consider surgical consultation. F/U in  6 months if clinically recommended.      # Coronary angiography 02/17/22: Left Anterior Descending  The vessel exhibits minimal luminal irregularities.  Dist LAD lesion is 5% stenosed. The lesion was previously treated .    First Diagonal Branch  1st Diag lesion is 70% stenosed.    Second Diagonal Branch    Left Circumflex  Prox Cx to Mid Cx lesion is 60% stenosed.      # Echocardiogram 02/16/22: IMPRESSIONS   1. Left ventricular ejection fraction, by estimation, is 60 to 65%. The  left ventricle has normal function. The left ventricle has no regional  wall motion abnormalities.   2. Right  ventricular systolic function is normal. The right ventricular  size is normal.   3. The mitral valve is degenerative. Trivial mitral valve regurgitation.  Moderate  mitral annular calcification.   4. The aortic valve was not well visualized. Aortic valve regurgitation  is trivial. Aortic valve sclerosis/calcification is present, without any  evidence of aortic stenosis.   Patient Profile     89 y.o. female  with a hx of COPD (prior smoker), known large AAA, HTN, HLD, CKD (III)(has solitary kidney) CAD STEMI on 02/15/2022, cardiac catheterization revealing spontaneous sealing of the distal LAD dissection, 2 days later repeat cardiac catheterization also revealing patent LAD.   Admitted with confusion/delirium/hallucinations >> developed new Afib here  Assessment & Plan     New onset AFib CHA2DS2Vasc is 5 On heparin gtt  She did have marked bradycardia this AM sustained high 20's-30bpm  ? If vagal  RN note reports associated with hypotension/cool/dusky, and reports of CP Got atropine with resolution of symptoms  Note reports EKG done (not noted in Epic)  Rates otherwise OK, 90's mostly  Pt had another episode of CP ~2 hours later NOT associated bradycardia EKG with this noted STE > s/l NTG with improvement in symptoms and EKG  2. Dynamic EKG changes early this AM associated with CP Attending cardiology team has been bedside Delirium/confusion, will be unable to cath today  Dr. Scharlene Gloss has dicussed with her daughter, plans for [facility] care consult to guide management strategies  Await this for POC, continue to avoid nodal blocking agents    Further with IM:   3. Encephalopathy 4. Probably dementia 5. CKD (IIIb)   For questions or updates, please contact [facility] Please consult [email or web address] for contact info under        Signed, Sheilah Pigeon, PA-C  12/27/2023, 9:57 AM

## 2023-12-27 NOTE — Progress Notes (Signed)
PROGRESS NOTE    Gina Bond  NGE:[id number] DOB: Feb 18, 1932 DOA: 12/23/2023 PCP: Verlon Au, MD   Brief Narrative:  The patient is an elderly 89 year old Caucasian female with a past medical history significant for but not limited to history of hypertension, hyperlipidemia, CKD stage IIIb, CAD, AAA, COPD, history of memory loss and other comorbidities who presented to the ED on 12/22/2022 with increasing confusion and agitation as well as associated hallucinations for last few days.  The patient herself had no complaints per family and appreciated the changes and family reported no falls or injuries or any medications.  In the ED head CT was negative for acute findings.  While she was hospitalized she episodes of chest pain and rapid heart rate and was foun to have an elevated troponin which trended up to 962.  Cardiology was consulted and she continued to have episodes of unresponsiveness and lethargy and also found to have atrial fibrillation with slow ventricular response with concerns for tachycardia-bradycardia syndrome.  Cardiology now feels that she may not be able to tolerate a cardiac cath given her confusion.  They recommended palliative consultation and palliative is now evaluated and they are recommending placement to hospice.  Assessment and Plan:  Tachy/bradycardia Syndrome Symptomatic Bradycardia -The patient has had multiple episodes of increasing frequency over the last 48 hours during which her blood pressure and heart rate dropped and she became presyncopal with chest pain - -Had discontinued any vasodilators that were being used for blood pressure control as well as negative chronotropic medications  -TTE 1/20 noted EF 75% with no WMA and mild LV hypertrophy with mild to moderate aortic valve stenosis  -EP has been asked to see the patient to consider pacemaker placement -see further discussion as per newly diagnosed A-fib -Cards was to do a cardiac cath prior  to EP evaluation but they do not feel that she will tolerate this given her ability to follow commands -He has had episodes of unresponsiveness associated with her A-fib with slow ventricular response and currently is being considered for pacemaker but may not be a candidate if she is not a cath candidate -Mains anticoag with heparin drip and will continue to have goals of care discussion palliative consulted for further evaluation -Palliative spoke with daughter. DNR decided. Continue management with medications. Needs placement with hospice.  And the family is aware of this and will be private pay and per palliative after the discussion with the daughter, the daughter is very realistic and just wants her mother comfortable and safe. She has experience with hospice with other family members.    Newly diagnosed Afib  -EKG consistent with true atrial fibrillation - given her altered mental status and attendant high risk for falls she is not a good candidate for anticoagulation long-term, but I feel it is reasonable during her inpatient stay  -Magnesium is normal -there is no evidence of hyperthyroidism -Cardiology is following -rate control will be difficult in setting of tachy/brady until she has a pacemaker    Acute v/s subacute encephalopathy of unclear etiology - acute delirium of unknown etiology not responding to treatment - possible hypertensive encephalopathy -CT head unrevealing - no clinical evidence of acute infection - no focal neurologic deficits - B12 is >400 -  -TSH elevated but not markedly so and free T4 is normal - respiratory virus panel negative -  -UA unrevealing - UDS negative - alcohol level <10 - ammonia normal -RPR nonreactive  - MRI brain notes no acute  findings but did reveal age-related atrophy with mild chronic small vessel ischemic change and right mastoid effusion - exam of right ear reveals no evidence of acute otitis media and patient has no symptoms to suggest this  - -Dr. Sharon Seller had spoken with the patient's daughter extensively -the description of the patient's behavior at home suggest that she is suffering with a progressive dementia -evaluation suggest this may be vascular in etiology  -She is experiencing some hallucinations -the patient's daughter and I agree that she is no longer safe to live at home independently  -The patient herself does not desire facility placement but she is no longer capable of making informed medical decisions given her cognitive decline -medical therapy will be initiated in an attempt to stabilize her mood -Palliative Care Consulted for GOC Discussion   Right mastoid effusion -Incidentally noted on MRI head - no clinical evidence to suggest active otitis media with otoscope exam 1/20 unrevealing - no signs of systemic infection   Uncontrolled Hypertension  Labile hypertension -Blood pressure 217/93 at presentation -titration of medications was carried out with blood pressure slowly improving initially then rapidly changing to the point of relative hypotension with orthostatic symptoms, accompanied and likely brought about by significant bradycardia  -decreased blood pressure medications 1/20 - present goal is for permissive hypertension but SBP <180 -at this point I think hypertensive encephalopathy  -Continue monitor blood pressures per protocol -As blood pressure reading is now 158/79   Elevated Troponin and chest discomfort -Likely related to atrial fibrillation and periods of severe bradycardia -Plan was for cardiac catheterization but cardiology is concerned about her ability to tolerate left heart cath and have held off for now and recommending palliative consult for goals of care discussion -Cardiology feels that she may have underlying obstructive CAD but given confusion and difficulty following commands unlikely able to tolerate left heart cath and aggressive cardiovascular care -Cardiology recommending  transitioning to hospice care -Troponin went from 821 -> 429 -In the interim cardiology added Imdur 30 mg p.o. daily, amlodipine 5 mg p.o. daily -They have also added hydralazine as needed for systolic blood pressure in 180 -If she continues to have episodes of bradycardia and chest pain cardiology recommends to notify them   CKD stage IIIb, improved  -Baseline creatinine appears to be 1.4 - creatinine steadily climbing - with episodes of orthostasis/bradycardia will hydrate gently and follow trend, being mindful of probable moderate AoS -BUN/Cr Trend: Recent Labs  Lab 12/23/23 0145 12/24/23 1434 12/26/23 0433 12/27/23 0749  BUN 24* 20 30* 24*  CREATININE 1.52* 1.61* 1.95* 1.42*  -Avoid Nephrotoxic Medications, Contrast Dyes, Hypotension and Dehydration to Ensure Adequate Renal Perfusion and will need to Renally Adjust Meds -Continue to Monitor and Trend Renal Function carefully and repeat CMP in the AM   COPD -Well compensated presently SpO2: 100 % O2 Flow Rate (L/min): 2 L/min  CAD / AAA -Continue Plavix statin and nitrates -AAA was 5.2 cm in 2023   Hypoalbuminemia -Patient's Albumin Trend: Recent Labs  Lab 12/23/23 0145 12/24/23 1434 12/27/23 1014  ALBUMIN 4.1 3.6 3.3*  -Continue to Monitor and Trend and repeat CMP in the AM   DVT prophylaxis: Anticoagulated with a heparin drip    Code Status: Limited: Do not attempt resuscitation (DNR) -DNR-LIMITED -Do Not Intubate/DNI  Family Communication: Discussed with the patient's daughter at bedside  Disposition Plan:  Level of care: Telemetry Cardiac Status is: Inpatient Remains inpatient appropriate because: His further clinical improvement and further goals of care discussion  and clearance by the specialist   Consultants:  Medical Cardiology EP Cardiology Palliative Care Medicine  Procedures:  ECHOCARDIOGRAM IMPRESSIONS     1. Left ventricular ejection fraction, by estimation, is 75%. The left  ventricle has  hyperdynamic function. The left ventricle has no regional  wall motion abnormalities. There is mild left ventricular hypertrophy.  Left ventricular diastolic parameters  are indeterminate.   2. Right ventricular systolic function is mildly reduced. The right  ventricular size is normal. Tricuspid regurgitation signal is inadequate  for assessing PA pressure.   3. Right atrial size was mildly dilated.   4. The mitral valve is degenerative. Trivial mitral valve regurgitation.  No evidence of mitral stenosis. Moderate mitral annular calcification.   5. The aortic valve is abnormal and not well visualized. There is  moderate calcification of the aortic valve. Aortic valve regurgitation is  not visualized. Mild-moderate aortic valve stenosis. Aortic valve area, by  VTI measures 1.22 cm. Aortic valve  mean gradient measures 11.4 mmHg. Aortic valve Vmax measures 2.31 m/s. DVI  0.48, SVI 20. Findings may represent paradoxical low flow low gradient AS,  possibly moderate AS based on valve opening. Poor quality LVOT TVI makes  assessment challenging.   6. The inferior vena cava is normal in size with greater than 50%  respiratory variability, suggesting right atrial pressure of 3 mmHg.   FINDINGS   Left Ventricle: Left ventricular ejection fraction, by estimation, is  75%. The left ventricle has hyperdynamic function. The left ventricle has  no regional wall motion abnormalities. The left ventricular internal  cavity size was normal in size. There is   mild left ventricular hypertrophy. Left ventricular diastolic parameters  are indeterminate.   Right Ventricle: The right ventricular size is normal. Right vetricular  wall thickness was not well visualized. Right ventricular systolic  function is mildly reduced. Tricuspid regurgitation signal is inadequate  for assessing PA pressure.   Left Atrium: Left atrial size was normal in size.   Right Atrium: Right atrial size was mildly dilated.    Pericardium: There is no evidence of pericardial effusion.   Mitral Valve: The mitral valve is degenerative in appearance. Moderate  mitral annular calcification. Trivial mitral valve regurgitation. No  evidence of mitral valve stenosis. MV peak gradient, 4.2 mmHg. The mean  mitral valve gradient is 1.0 mmHg.   Tricuspid Valve: The tricuspid valve is grossly normal. Tricuspid valve  regurgitation is trivial. No evidence of tricuspid stenosis.   Aortic Valve: The aortic valve is abnormal. There is moderate  calcification of the aortic valve. Aortic valve regurgitation is not  visualized. Mild aortic stenosis is present. Aortic valve mean gradient  measures 11.4 mmHg. Aortic valve peak gradient  measures 21.3 mmHg. Aortic valve area, by VTI measures 1.22 cm.   Pulmonic Valve: The pulmonic valve was grossly normal. Pulmonic valve  regurgitation is trivial. No evidence of pulmonic stenosis.   Aorta: The aortic root is normal in size and structure.   Venous: The inferior vena cava is normal in size with greater than 50%  respiratory variability, suggesting right atrial pressure of 3 mmHg.   IAS/Shunts: The atrial septum is grossly normal.     LEFT VENTRICLE  PLAX 2D  LVIDd:         3.10 cm     Diastology  LVIDs:         2.20 cm     LV e' medial:    4.03 cm/s  LV PW:  1.00 cm     LV E/e' medial:  17.6  LV IVS:        0.90 cm     LV e' lateral:   7.07 cm/s  LVOT diam:     1.80 cm     LV E/e' lateral: 10.0  LV SV:         35  LV SV Index:   20  LVOT Area:     2.54 cm    LV Volumes (MOD)  LV vol d, MOD A2C: 57.4 ml  LV vol d, MOD A4C: 90.6 ml  LV vol s, MOD A2C: 23.0 ml  LV vol s, MOD A4C: 34.5 ml  LV SV MOD A2C:     34.4 ml  LV SV MOD A4C:     90.6 ml  LV SV MOD BP:      40.2 ml   RIGHT VENTRICLE             IVC  RV Basal diam:  2.70 cm     IVC diam: 1.40 cm  RV Mid diam:    2.40 cm  RV S prime:     14.70 cm/s   LEFT ATRIUM             Index        RIGHT  ATRIUM           Index  LA diam:        1.80 cm 1.01 cm/m   RA Area:     20.70 cm  LA Vol (A2C):   42.4 ml 23.77 ml/m  RA Volume:   61.40 ml  34.43 ml/m  LA Vol (A4C):   38.1 ml 21.36 ml/m  LA Biplane Vol: 41.1 ml 23.04 ml/m   AORTIC VALVE                     PULMONIC VALVE  AV Area (Vmax):    1.06 cm      PV Vmax:       0.72 m/s  AV Area (Vmean):   1.59 cm      PV Peak grad:  2.1 mmHg  AV Area (VTI):     1.22 cm  AV Vmax:           231.00 cm/s  AV Vmean:          102.370 cm/s  AV VTI:            0.285 m  AV Peak Grad:      21.3 mmHg  AV Mean Grad:      11.4 mmHg  LVOT Vmax:         96.60 cm/s  LVOT Vmean:        64.100 cm/s  LVOT VTI:          0.137 m  LVOT/AV VTI ratio: 0.48    AORTA  Ao Root diam: 2.90 cm  Ao Asc diam:  2.50 cm   MITRAL VALVE  MV Area (PHT): 2.76 cm    SHUNTS  MV Peak grad:  4.2 mmHg    Systemic VTI:  0.14 m  MV Mean grad:  1.0 mmHg    Systemic Diam: 1.80 cm  MV Vmax:       1.03 m/s  MV Vmean:      43.2 cm/s  MV Decel Time: 275 msec  MV E velocity: 70.75 cm/s   Antimicrobials:  Anti-infectives (From admission, onward)    None  Subjective: Seen and examined at bedside and she remains pleasantly confused.  Currently denies any pain.  No nausea or vomiting.  No other concerns or complaints this time  Objective: Vitals:   12/27/23 1600 12/27/23 1700 12/27/23 1800 12/27/23 1931  BP: (!) 142/110   (!) 158/79  Pulse: 78   93  Resp: 16   18  Temp: 98.6 F (37 C)   98.1 F (36.7 C)  TempSrc:    Oral  SpO2: 98% 96% 100% 100%  Weight:      Height:        Intake/Output Summary (Last 24 hours) at 12/27/2023 2033 Last data filed at 12/27/2023 1800 Gross per 24 hour  Intake --  Output 1000 ml  Net -1000 ml   Filed Weights   12/23/23 0150 12/23/23 1917 12/27/23 0300  Weight: 65.8 kg 69.3 kg 65.2 kg   Examination: Physical Exam:  Constitutional: Elderly chronically ill-appearing Caucasian female in no acute distress Respiratory:  Diminished to auscultation bilaterally with some coarse breath sounds, no wheezing, rales, rhonchi or crackles. Normal respiratory effort and patient is not tachypenic. No accessory muscle use.  Unlabored breathing Cardiovascular: Irregularly irregular, no murmurs / rubs / gallops. S1 and S2 auscultated.  Mild extremity edema Abdomen: Soft, non-tender, non-distended. Bowel sounds positive.  GU: Deferred. Musculoskeletal: No clubbing / cyanosis of digits/nails. No joint deformity upper and lower extremities. Neurologic: CN 2-12 grossly intact with no focal deficits remains extremely confused.  Romberg sign cerebellar reflexes not assessed.  Psychiatric: Impaired judgment and insight. Alert and oriented x 1.  Pleasantly demented  Data Reviewed: I have personally reviewed following labs and imaging studies  CBC: Recent Labs  Lab 12/23/23 0145 12/24/23 1434 12/26/23 0433 12/27/23 0749  WBC 5.5 5.9 6.2 5.2  NEUTROABS 3.0  --   --   --   HGB 13.8 14.5 14.5 13.8  HCT 41.8 43.1 43.7 42.6  MCV 91.3 89.0 90.3 92.2  PLT 212 245 199 205   Basic Metabolic Panel: Recent Labs  Lab 12/23/23 0145 12/23/23 0522 12/24/23 1434 12/26/23 0433 12/27/23 0749 12/27/23 1014  NA 142  --  140 138 141  --   K 3.7  --  3.7 3.3* 4.5  --   CL 106  --  104 103 107  --   CO2 24  --  23 23 24   --   GLUCOSE 103*  --  106* 111* 112*  --   BUN 24*  --  20 30* 24*  --   CREATININE 1.52*  --  1.61* 1.95* 1.42*  --   CALCIUM 9.9  --  9.8 9.5 9.3  --   MG  --  1.9  --   --   --  1.7  PHOS  --  2.8  --   --   --  2.9   GFR: Estimated Creatinine Clearance: 25.1 mL/min (A) (by C-G formula based on SCr of 1.42 mg/dL (H)). Liver Function Tests: Recent Labs  Lab 12/23/23 0145 12/24/23 1434 12/27/23 1014  AST 25 37 30  ALT 14 16 19   ALKPHOS 32* 30* 30*  BILITOT 0.7 0.9 0.6  PROT 6.5 5.9* 5.7*  ALBUMIN 4.1 3.6 3.3*   Recent Labs  Lab 12/23/23 0145  LIPASE 56*   Recent Labs  Lab 12/23/23 0522   AMMONIA 27   Coagulation Profile: No results for input(s): "INR", "PROTIME" in the last 168 hours. Cardiac Enzymes: No results for input(s): "CKTOTAL", "CKMB", "CKMBINDEX", "TROPONINI" in  the last 168 hours. BNP (last 3 results) No results for input(s): "PROBNP" in the last 8760 hours. HbA1C: No results for input(s): "HGBA1C" in the last 72 hours. CBG: Recent Labs  Lab 12/24/23 1606 12/24/23 2333  GLUCAP 86 123*   Lipid Profile: No results for input(s): "CHOL", "HDL", "LDLCALC", "TRIG", "CHOLHDL", "LDLDIRECT" in the last 72 hours. Thyroid Function Tests: No results for input(s): "TSH", "T4TOTAL", "FREET4", "T3FREE", "THYROIDAB" in the last 72 hours. Anemia Panel: No results for input(s): "VITAMINB12", "FOLATE", "FERRITIN", "TIBC", "IRON", "RETICCTPCT" in the last 72 hours. Sepsis Labs: No results for input(s): "PROCALCITON", "LATICACIDVEN" in the last 168 hours.  Recent Results (from the past 240 hours)  Resp panel by RT-PCR (RSV, Flu A&B, Covid) Anterior Nasal Swab     Status: None   Collection Time: 12/23/23  2:10 AM   Specimen: Anterior Nasal Swab  Result Value Ref Range Status   SARS Coronavirus 2 by RT PCR NEGATIVE NEGATIVE Final   Influenza A by PCR NEGATIVE NEGATIVE Final   Influenza B by PCR NEGATIVE NEGATIVE Final    Comment: (NOTE) The Xpert Xpress SARS-CoV-2/FLU/RSV plus assay is intended as an aid in the diagnosis of influenza from Nasopharyngeal swab specimens and should not be used as a sole basis for treatment. Nasal washings and aspirates are unacceptable for Xpert Xpress SARS-CoV-2/FLU/RSV testing.  Fact Sheet for Patients: BloggerCourse.com  Fact Sheet for Healthcare Providers: SeriousBroker.it  This test is not yet approved or cleared by the [place] FDA and has been authorized for detection and/or diagnosis of SARS-CoV-2 by FDA under an Emergency Use Authorization (EUA). This EUA will  remain in effect (meaning this test can be used) for the duration of the COVID-19 declaration under Section 564(b)(1) of the Act, 21 U.S.C. section 360bbb-3(b)(1), unless the authorization is terminated or revoked.     Resp Syncytial Virus by PCR NEGATIVE NEGATIVE Final    Comment: (NOTE) Fact Sheet for Patients: BloggerCourse.com  Fact Sheet for Healthcare Providers: SeriousBroker.it  This test is not yet approved or cleared by the [place] FDA and has been authorized for detection and/or diagnosis of SARS-CoV-2 by FDA under an Emergency Use Authorization (EUA). This EUA will remain in effect (meaning this test can be used) for the duration of the COVID-19 declaration under Section 564(b)(1) of the Act, 21 U.S.C. section 360bbb-3(b)(1), unless the authorization is terminated or revoked.  Performed at [facility], [address], [place], [place] [address]     Radiology Studies: No results found.  Scheduled Meds:  amLODipine  5 mg Oral Daily   docusate sodium  100 mg Oral BID   isosorbide mononitrate  30 mg Oral Daily   pantoprazole  40 mg Oral Daily   pravastatin  80 mg Oral q1800   senna  1 tablet Oral Q breakfast   sodium chloride flush  3 mL Intravenous Q12H   Continuous Infusions:  heparin 1,150 Units/hr (12/27/23 2031)    LOS: 3 days   Marguerita Merles, DO [facility] Available via Epic secure chat 7am-7pm After these hours, please refer to coverage provider listed on [email or web address] 12/27/2023, 8:33 PM

## 2023-12-27 NOTE — Plan of Care (Signed)
Patient had another episode of symptomatic bradycardiac with associated chest pain. Rapid was called and pushed atropine. HR recovered and patient pain free. EP is planning PPM Thursday. For now pads are on patient

## 2023-12-27 NOTE — Progress Notes (Signed)
Pt was attempting to get out of bed setting off bed alarm. Immediately entered pt room and pt reminded that she can urinate into [place]. Pt continued to be confused and trying to get out of bed. Pt heart rate dropped into 30s. Hospitalist called. Rapid response called. EKG obtained. Pt blood pressure low, SBP 48. Rapid response administered atropine and heart rate rebounded. Hospitalist asked that Cardiology be called. Cardiologist on call saw pt at present no new orders given as pt is to have permanent pacemaker placed. Pt now has heart rate on 90s-100s and blood pressure now SBP 180s.  Will continue to monitor pt for further decline.

## 2023-12-27 NOTE — Progress Notes (Signed)
PHARMACY - ANTICOAGULATION CONSULT NOTE  Pharmacy Consult for Heparin while Eliquis on hold Indication: atrial fibrillation  Allergies  Allergen Reactions   Benicar [Olmesartan] Shortness Of Breath   Corticosteroids Shortness Of Breath    Steroid inhalers   Other Shortness Of Breath, Rash and Other (See Comments)    Wool = Rashes Certain dyes; Fillers used by some manufacturers   Spironolactone Shortness Of Breath   Sulfa Antibiotics Hives   Tricor [Fenofibrate] Shortness Of Breath and Other (See Comments)    AMNEAL manufacturer only, causes shortness of breath otherwise.    Crestor [Rosuvastatin Calcium] Other (See Comments)    Myalgias   Klaron [Sulfacetamide] Hives   Lipitor [Atorvastatin] Other (See Comments)    Myalgias    Magnesium Other (See Comments)    Myalgias   Statins Other (See Comments)    Myalgias    Zofran [Ondansetron] Other (See Comments)    Disorientation    Bactrim [Sulfamethoxazole-Trimethoprim] Hives and Rash   Tobrex [Tobramycin] Rash and Other (See Comments)    Blurred vision    Patient Measurements: Height: 5\' 6"  (167.6 cm) Weight: 65.2 kg (143 lb 11.8 oz) IBW/kg (Calculated) : 59.3 Heparin Dosing Weight: 65 kg  Vital Signs: Temp: 98.5 F (36.9 C) (01/22 0752) Temp Source: Axillary (01/22 0752) BP: 141/117 (01/22 1231) Pulse Rate: 83 (01/22 0900)  Labs: Recent Labs    12/25/23 0033 12/25/23 0221 12/26/23 0433 12/26/23 1037 12/27/23 0749 12/27/23 1738  HGB  --   --  14.5  --  13.8  --   HCT  --   --  43.7  --  42.6  --   PLT  --   --  199  --  205  --   APTT  --   --   --   --  57* 62*  HEPARINUNFRC  --   --   --   --  >1.10*  --   CREATININE  --   --  1.95*  --  1.42*  --   TROPONINIHS 962* 821*  --  429*  --   --     Estimated Creatinine Clearance: 25.1 mL/min (A) (by C-G formula based on SCr of 1.42 mg/dL (H)).  Assessment: 89 yo female with new afib started on Eliquis 1/20. Now transitioning to IV heparin given need for  pacemaker. Received Eliquis 2.5mg  at 11:14 this morning. CBC wnl. SCr 1.95. No bleeding reported.  1/22 AM update: HL >1.10 aPTT 57 seconds No signs of bleeding or issues with the infusion per nursing  1/22 PM: aPTT 62 sec and just below goal No signs of bleeding or issues with the infusion per nursing  Goal of Therapy:  Heparin level 0.3-0.7 units/ml aPTT 66-102 seconds Monitor platelets by anticoagulation protocol: Yes   Plan:  Increase heparin infusion to 1150 units/hr aPTT in ~8 hours with AM labs Check aPTT and HL daily until correlate, then HL only F/u plans for pacemaker  Thank you for involving pharmacy in this patient's care.  Loura Back, PharmD, BCPS Clinical Pharmacist Clinical phone for 12/27/2023 is [phone] 12/27/2023 6:40 PM

## 2023-12-28 DIAGNOSIS — I2489 Other forms of acute ischemic heart disease: Secondary | ICD-10-CM | POA: Diagnosis not present

## 2023-12-28 DIAGNOSIS — I214 Non-ST elevation (NSTEMI) myocardial infarction: Secondary | ICD-10-CM

## 2023-12-28 DIAGNOSIS — G9340 Encephalopathy, unspecified: Secondary | ICD-10-CM | POA: Diagnosis not present

## 2023-12-28 DIAGNOSIS — R7989 Other specified abnormal findings of blood chemistry: Secondary | ICD-10-CM | POA: Diagnosis not present

## 2023-12-28 DIAGNOSIS — I4891 Unspecified atrial fibrillation: Secondary | ICD-10-CM | POA: Diagnosis not present

## 2023-12-28 DIAGNOSIS — I2510 Atherosclerotic heart disease of native coronary artery without angina pectoris: Secondary | ICD-10-CM | POA: Diagnosis not present

## 2023-12-28 DIAGNOSIS — I480 Paroxysmal atrial fibrillation: Secondary | ICD-10-CM

## 2023-12-28 DIAGNOSIS — R410 Disorientation, unspecified: Secondary | ICD-10-CM | POA: Diagnosis not present

## 2023-12-28 DIAGNOSIS — N1832 Chronic kidney disease, stage 3b: Secondary | ICD-10-CM | POA: Diagnosis not present

## 2023-12-28 LAB — CBC WITH DIFFERENTIAL/PLATELET
Abs Immature Granulocytes: 0.06 10*3/uL (ref 0.00–0.07)
Basophils Absolute: 0.1 10*3/uL (ref 0.0–0.1)
Basophils Relative: 1 %
Eosinophils Absolute: 0.2 10*3/uL (ref 0.0–0.5)
Eosinophils Relative: 4 %
HCT: 40.6 % (ref 36.0–46.0)
Hemoglobin: 13.4 g/dL (ref 12.0–15.0)
Immature Granulocytes: 1 %
Lymphocytes Relative: 27 %
Lymphs Abs: 1.5 10*3/uL (ref 0.7–4.0)
MCH: 30.2 pg (ref 26.0–34.0)
MCHC: 33 g/dL (ref 30.0–36.0)
MCV: 91.4 fL (ref 80.0–100.0)
Monocytes Absolute: 0.5 10*3/uL (ref 0.1–1.0)
Monocytes Relative: 8 %
Neutro Abs: 3.2 10*3/uL (ref 1.7–7.7)
Neutrophils Relative %: 59 %
Platelets: 196 10*3/uL (ref 150–400)
RBC: 4.44 MIL/uL (ref 3.87–5.11)
RDW: 14 % (ref 11.5–15.5)
WBC: 5.5 10*3/uL (ref 4.0–10.5)
nRBC: 0 % (ref 0.0–0.2)

## 2023-12-28 LAB — COMPREHENSIVE METABOLIC PANEL
ALT: 19 U/L (ref 0–44)
AST: 28 U/L (ref 15–41)
Albumin: 3.4 g/dL — ABNORMAL LOW (ref 3.5–5.0)
Alkaline Phosphatase: 30 U/L — ABNORMAL LOW (ref 38–126)
Anion gap: 9 (ref 5–15)
BUN: 22 mg/dL (ref 8–23)
CO2: 26 mmol/L (ref 22–32)
Calcium: 9.3 mg/dL (ref 8.9–10.3)
Chloride: 107 mmol/L (ref 98–111)
Creatinine, Ser: 1.51 mg/dL — ABNORMAL HIGH (ref 0.44–1.00)
GFR, Estimated: 33 mL/min — ABNORMAL LOW (ref >60)
Glucose, Bld: 107 mg/dL — ABNORMAL HIGH (ref 70–99)
Potassium: 3.9 mmol/L (ref 3.5–5.1)
Sodium: 142 mmol/L (ref 135–145)
Total Bilirubin: 0.5 mg/dL (ref 0.0–1.2)
Total Protein: 5.8 g/dL — ABNORMAL LOW (ref 6.5–8.1)

## 2023-12-28 LAB — HEPARIN LEVEL (UNFRACTIONATED): Heparin Unfractionated: 0.81 [IU]/mL — ABNORMAL HIGH (ref 0.30–0.70)

## 2023-12-28 LAB — APTT: aPTT: 92 s — ABNORMAL HIGH (ref 24–36)

## 2023-12-28 LAB — MAGNESIUM: Magnesium: 1.9 mg/dL (ref 1.7–2.4)

## 2023-12-28 LAB — PHOSPHORUS: Phosphorus: 2.9 mg/dL (ref 2.5–4.6)

## 2023-12-28 NOTE — Progress Notes (Signed)
Cardiology Progress Note  Patient ID: Gina Bond MRN: [id number] DOB: 1932-03-13 Date of Encounter: 12/28/2023 Primary Cardiologist: None  Subjective   Chief Complaint: None.   HPI: Reports no chest pain or trouble breathing.  Plan is for hospice care.  ROS:  All other ROS reviewed and negative. Pertinent positives noted in the HPI.     Telemetry  Overnight telemetry shows A-fib 90 to 120 bpm, which I personally reviewed.    Physical Exam   Vitals:   12/27/23 1931 12/28/23 0236 12/28/23 0534 12/28/23 0844  BP: (!) 158/79 101/66 (!) 183/126 (!) 143/90  Pulse: 93 (!) 101 86 93  Resp: 18 18 18 18   Temp: 98.1 F (36.7 C) 98.4 F (36.9 C) 98.4 F (36.9 C) (!) 97.5 F (36.4 C)  TempSrc: Oral Oral Oral Oral  SpO2: 100% 97% 97% 96%  Weight:   65.7 kg   Height:        Intake/Output Summary (Last 24 hours) at 12/28/2023 1006 Last data filed at 12/28/2023 0510 Gross per 24 hour  Intake 557.77 ml  Output 650 ml  Net -92.23 ml       12/28/2023    5:34 AM 12/27/2023    3:00 AM 12/23/2023    7:17 PM  Last 3 Weights  Weight (lbs) 144 lb 13.5 oz 143 lb 11.8 oz 152 lb 12.5 oz  Weight (kg) 65.7 kg 65.2 kg 69.3 kg    Body mass index is 23.38 kg/m.  General: Well nourished, well developed, in no acute distress Head: Atraumatic, normal size  Eyes: PEERLA, EOMI  Neck: Supple, no JVD Endocrine: No thryomegaly Cardiac: Normal S1, S2; irregular rhythm, 2 out of 6 systolic ejection murmur Lungs: Diminished breath sounds bilaterally Abd: Soft, nontender, no hepatomegaly  Ext: No edema, pulses 2+ Musculoskeletal: No deformities, BUE and BLE strength normal and equal Skin: Warm and dry, no rashes   Neuro: Alert, awake, oriented to person and place only  Cardiac Studies  TTE 12/25/2023  1. Left ventricular ejection fraction, by estimation, is 75%. The left  ventricle has hyperdynamic function. The left ventricle has no regional  wall motion abnormalities. There is mild  left ventricular hypertrophy.  Left ventricular diastolic parameters  are indeterminate.   2. Right ventricular systolic function is mildly reduced. The right  ventricular size is normal. Tricuspid regurgitation signal is inadequate  for assessing PA pressure.   3. Right atrial size was mildly dilated.   4. The mitral valve is degenerative. Trivial mitral valve regurgitation.  No evidence of mitral stenosis. Moderate mitral annular calcification.   5. The aortic valve is abnormal and not well visualized. There is  moderate calcification of the aortic valve. Aortic valve regurgitation is  not visualized. Mild-moderate aortic valve stenosis. Aortic valve area, by  VTI measures 1.22 cm. Aortic valve  mean gradient measures 11.4 mmHg. Aortic valve Vmax measures 2.31 m/s. DVI  0.48, SVI 20. Findings may represent paradoxical low flow low gradient AS,  possibly moderate AS based on valve opening. Poor quality LVOT TVI makes  assessment challenging.   6. The inferior vena cava is normal in size with greater than 50%  respiratory variability, suggesting right atrial pressure of 3 mmHg.   Patient Profile  Gina Bond is a 89 y.o. female with CAD, memory loss (likely dementia), AAA (5.2 cm), CKD stage IIIb, hypertension, hyperlipidemia admitted on 12/23/2023 with increased agitation and hallucinations. Cardiology consulted on 12/25/2023 for episode of nonresponsiveness and elevated troponins.  Assessment & Plan   # Chest pain # Non-STEMI -Admitted with encephalopathy and confusion.  Course complicated by new onset A-fib and chest pain.  She is continue to have bradycardia and has tachycardia-bradycardia syndrome but her ongoing chest pain no dynamic EKG changes are concerning for underlying obstructive CAD. -She has memory Pearman and likely undiagnosed dementia.  She is here with delirium and is quite confused.  She is unable to participate with left heart catheterization.  Goals of care  discussion ensued and we have decided on hospice care. -Would continue Imdur 30 mg daily and Norvasc 5 mg daily.  She has had no further angina on these.  These could be continued for comfort. -Daughter is in agreement with this plan. -Normal echo is reassuring. -Okay to discontinue statin and discontinue heparin drip.  # Persistent atrial fibrillation # Tachycardia-bradycardia syndrome # Symptomatic bradycardia -A-fib new this admission.  Developed tachycardia-bradycardia syndrome with symptomatic bradycardia.  Was being evaluated for pacemaker but suffered a non-STEMI and needed other invasive procedures.  After goals of care discussion we will not be pursuing any aggressive cardiovascular care.  Avoid AV nodal agents.  Okay to stop heparin.  # Dementia # Acute delirium # Transition to hospice care -After developing several cardiac issues family has decided to transition to hospice care.  I agree with this.  Cardiology to be available as needed.  [facility] will sign off.   Medication Recommendations: As above Other recommendations (labs, testing, etc): None Follow up as an outpatient: None needed  For questions or updates, please contact [facility] Please consult [email or web address] for contact info under        Signed, Gerri Spore T. Flora Lipps, MD, [facility]  [facility]  12/28/2023 10:06 AM

## 2023-12-28 NOTE — Progress Notes (Signed)
PT Cancellation Note  Patient Details Name: Gina Bond MRN: [id number] DOB: 09/26/1932   Cancelled Treatment:    Reason Eval/Treat Not Completed: Other (comment) (Spoke with daughter regarding if they desire pt to still work wiht PT and she is concerned that pt may not be able to.  Messaged MD and nurse to see if PT should continue.)   Gina Bond 12/28/2023, 11:36 AM Gina Bond,PT [facility] [phone]

## 2023-12-28 NOTE — Plan of Care (Signed)
  Problem: Activity: Goal: Risk for activity intolerance will decrease Outcome: Progressing   Problem: Nutrition: Goal: Adequate nutrition will be maintained Outcome: Progressing   

## 2023-12-28 NOTE — Plan of Care (Signed)

## 2023-12-28 NOTE — TOC Initial Note (Addendum)
Transition of Care [facility]) - Initial/Assessment Note    Patient Details  Name: Gina Bond MRN: [id number] Date of Birth: 01/14/1932  Transition of Care [facility]) CM/SW Contact:    Michaela Corner, LCSWA Phone Number: 12/28/2023, 08:11AM   Clinical Narrative:   CSW spoke with pts dtr, [name], about pts care. [name] informed CSW that after speaking with Palliative, new disposition is for hospice facility placement. [name] states she would like placement within the [facility] area as that is closest to her and the family. CSW informed [name] that [facility] [place]; [name] stated she would like to go with [facility] at this time.  CSW referred pt to [facility] at this time.              3:47 PM Treatment team clarified with CSW that dc plan is for SNF w/ hospice at this time. CSW spoke with pts dtr, [name], and clarified dc plan for SNF w/ hospice at this time. [name] gave her verbal understanding and states she is ok to continue to look at [facility] for short term placement.   CSW reached out to admissions at [facility]) and she stated she will review bed availability and follow up with CSW. She informed CSW that hospice services are available [facility]. Per palliative they spoke with pts dtr about hospice services being private pay.   Expected Discharge Plan: Hospice Medical Facility Barriers to Discharge: No Barriers Identified   Patient Goals and CMS Choice Patient states their goals for this hospitalization and ongoing recovery are:: Unable to assess          Expected Discharge Plan and Services In-house Referral: Clinical Social Work     Living arrangements for the past 2 months: Independent Living Facility                                      Prior Living Arrangements/Services Living arrangements for the past 2 months: Independent Living Facility Lives with:: Self Patient language and need for  interpreter reviewed:: Yes Do you feel safe going back to the place where you live?: Yes      Need for Family Participation in Patient Care: Yes (Comment) Care giver support system in place?: Yes (comment)   Criminal Activity/Legal Involvement Pertinent to Current Situation/Hospitalization: No - Comment as needed  Activities of Daily Living   ADL Screening (condition at time of admission) Independently performs ADLs?: No Does the patient have a NEW difficulty with bathing/dressing/toileting/self-feeding that is expected to last >3 days?: No Does the patient have a NEW difficulty with getting in/out of bed, walking, or climbing stairs that is expected to last >3 days?: No Does the patient have a NEW difficulty with communication that is expected to last >3 days?: No Is the patient deaf or have difficulty hearing?: No Does the patient have difficulty seeing, even when wearing glasses/contacts?: No Does the patient have difficulty concentrating, remembering, or making decisions?: No  Permission Sought/Granted                  Emotional Assessment Appearance:: Appears stated age Attitude/Demeanor/Rapport: Unable to Assess Affect (typically observed): Unable to Assess Orientation: : Oriented to Self Alcohol / Substance Use: Not Applicable Psych Involvement: No (comment)  Admission diagnosis:  Acute delirium [R41.0] Delirium [R41.0] Acute encephalopathy [G93.40] Patient Active Problem List  Diagnosis Date Noted   Paroxysmal atrial fibrillation (HCC) 12/25/2023   Troponin level elevated 12/25/2023   Atrial fibrillation with RVR (HCC) 12/25/2023   Demand ischemia (HCC) 12/25/2023   Acute encephalopathy 12/23/2023   Hypertensive crisis 12/23/2023   Acute delirium 12/23/2023   Memory loss 03/09/2023   Acute ST elevation myocardial infarction (STEMI) of anteroseptal wall (HCC) 02/15/2022   Unstable angina pectoris (HCC) 02/15/2022   GERD (gastroesophageal reflux disease)  02/02/2022   Depression 02/02/2022   Chest pain, rule out acute myocardial infarction 02/01/2022   Pulmonary hypertension with chronic cor pulmonale (HCC) 08/17/2021   Unspecified fracture of the lower end of left radius, initial encounter for closed fracture 08/17/2021   Basal cell carcinoma (BCC) of skin of nose 07/07/2020   Abdominal aortic aneurysm (HCC) 09/04/2018   Chronic constipation 06/15/2017   Diarrhea 03/17/2017   Right hip pain 05/26/2016   Hypoxia    Dehydration 12/06/2015   Coronary artery disease involving native coronary artery of native heart without angina pectoris 12/06/2015   Renal failure (ARF), acute on chronic (HCC)    Back pain    Dyspnea    Ileitis 07/24/2015   Chronic kidney disease, stage 3b (HCC) 07/24/2015   Breast pain, left 04/13/2015   Primary hypertension 12/10/2014   Unstable angina (HCC) 12/09/2014   Cramp of both lower extremities 08/18/2014   Mixed hyperlipidemia 03/10/2014   Pain in joint, pelvic region and thigh 03/10/2014   COPD (chronic obstructive pulmonary disease) (HCC) 09/13/2013   Osteoarthrosis 08/09/2013   Disorder of kidney and ureter 07/16/2013   Thoracic or lumbosacral neuritis or radiculitis 07/16/2013   PCP:  Verlon Au, MD Pharmacy:   [id number] - [place], [place] - [address] AT [facility] OF [address] [place] [address] Phone: [phone] Fax: [phone]  [id number] - [place], [place] - [address] AT [facility] OF [address] [place] [address] Phone: [phone] Fax: [phone]  [id number] [place],  - [address] AT [facility] [address] [place] [address] Phone: [phone] Fax: [phone]     Social Drivers of Health (SDOH) Social History: SDOH Screenings   Food Insecurity: Patient Unable To Answer (12/24/2023)  Housing: Patient Unable To Answer (12/24/2023)   Transportation Needs: Patient Unable To Answer (12/24/2023)  Utilities: Low Risk  (11/30/2023)   Received from [facility]  Social Connections: Patient Unable To Answer (12/24/2023)  Tobacco Use: Medium Risk (12/23/2023)   SDOH Interventions:     Readmission Risk Interventions    08/20/2021    1:02 PM  Readmission Risk Prevention Plan  Transportation Screening Complete  PCP or Specialist Appt within 5-7 Days Complete  Home Care Screening Complete  Medication Review (RN CM) Complete

## 2023-12-28 NOTE — Progress Notes (Signed)
Palliative care noted DNR/DNI >> Plans for placement with Hospice support  No plans for cath/PPM Medical management otherwise Telemetry reviewed, rate controlled AFib, no brady events  EP will sign off though remain available. continue to avoid nodal blocking agents for BP management Recall if needed  Francis Dowse, PA-C

## 2023-12-28 NOTE — Progress Notes (Signed)
PROGRESS NOTE    Gina Bond  ZOX:[id number] DOB: 08-27-1932 DOA: 12/23/2023 PCP: Verlon Au, MD   Brief Narrative:  The patient is an elderly 89 year old Caucasian female with a past medical history significant for but not limited to history of hypertension, hyperlipidemia, CKD stage IIIb, CAD, AAA, COPD, history of memory loss and other comorbidities who presented to the ED on 12/22/2022 with increasing confusion and agitation as well as associated hallucinations for last few days.  The patient herself had no complaints per family and appreciated the changes and family reported no falls or injuries or any medications.  In the ED head CT was negative for acute findings.  While she was hospitalized she episodes of chest pain and rapid heart rate and was foun to have an elevated troponin which trended up to 962.  Cardiology was consulted and she continued to have episodes of unresponsiveness and lethargy and also found to have atrial fibrillation with slow ventricular response with concerns for tachycardia-bradycardia syndrome.  Cardiology now feels that she may not be able to tolerate a cardiac cath given her confusion.  They recommended palliative consultation and palliative is now evaluated and they are recommending placement to hospice [facility] is assisting discharge disposition.  Assessment and Plan:  Tachy/bradycardia Syndrome Symptomatic Bradycardia -The patient has had multiple episodes of increasing frequency over the last 48 hours during which her blood pressure and heart rate dropped and she became presyncopal with chest pain - -Had discontinued any vasodilators that were being used for blood pressure control as well as negative chronotropic medications  -TTE 1/20 noted EF 75% with no WMA and mild LV hypertrophy with mild to moderate aortic valve stenosis  -EP has been asked to see the patient to consider pacemaker placement -see further discussion as per newly diagnosed  A-fib -Cards was to do a cardiac cath prior to EP evaluation but they do not feel that she will tolerate this given her ability to follow commands -He has had episodes of unresponsiveness associated with her A-fib with slow ventricular response and currently is being considered for pacemaker but may not be a candidate if she is not a cath candidate -Mains anticoag with heparin drip and will continue to have goals of care discussion palliative consulted for further evaluation -Palliative spoke with daughter. DNR decided. Continue management with medications. Needs placement with hospice.  And the family is aware of this and will be private pay and per palliative after the discussion with the daughter, the daughter is very realistic and just wants her mother comfortable and safe. She has experience with hospice with other family members.  -Patient is to pursue hospice care with placement she is not a candidate for residential hospice given that she is greater than 2 weeks likely in the live   Newly diagnosed Afib  -EKG consistent with true atrial fibrillation - given her altered mental status and attendant high risk for falls she is not a good candidate for anticoagulation long-term, but I feel it is reasonable during her inpatient stay  -Magnesium is normal -there is no evidence of hyperthyroidism -Cardiology is following -rate control will be difficult in setting of tachy/brady until she has a pacemaker pacemaker will not be pursued given that the plan is for hospice care   Acute v/s subacute encephalopathy of unclear etiology - acute delirium of unknown etiology not responding to treatment - possible hypertensive encephalopathy -CT head unrevealing - no clinical evidence of acute infection - no focal neurologic deficits -  B12 is >400 -  -TSH elevated but not markedly so and free T4 is normal - respiratory virus panel negative -  -UA unrevealing - UDS negative - alcohol level <10 - ammonia normal -RPR  nonreactive  - MRI brain notes no acute findings but did reveal age-related atrophy with mild chronic small vessel ischemic change and right mastoid effusion - exam of right ear reveals no evidence of acute otitis media and patient has no symptoms to suggest this - -Dr. Sharon Seller had spoken with the patient's daughter extensively -the description of the patient's behavior at home suggest that she is suffering with a progressive dementia -evaluation suggest this may be vascular in etiology  -She is experiencing some hallucinations -the patient's daughter and I agree that she is no longer safe to live at home independently  -The patient herself does not desire facility placement but she is no longer capable of making informed medical decisions given her cognitive decline -medical therapy will be initiated in an attempt to stabilize her mood -Palliative Care Consulted for GOC Discussion and plan is for hospice care and pursuing placement with hospice support   Right mastoid effusion -Incidentally noted on MRI head - no clinical evidence to suggest active otitis media with otoscope exam 1/20 unrevealing - no signs of systemic infection -Not workup now that plan is for hospice care and pursuing placement with hospice support   Uncontrolled Hypertension  Labile hypertension -Blood pressure 217/93 at presentation -titration of medications was carried out with blood pressure slowly improving initially then rapidly changing to the point of relative hypotension with orthostatic symptoms, accompanied and likely brought about by significant bradycardia  -decreased blood pressure medications 1/20 - present goal is for permissive hypertension but SBP <180 -at this point I think hypertensive encephalopathy  -Continue monitor blood pressures per protocol -As blood pressure reading is now 158/79   NSTEMI with Elevated Troponin and Chest Discomfort -Likely related to atrial fibrillation and periods of severe  bradycardia -Plan was for cardiac catheterization but cardiology is concerned about her ability to tolerate left heart cath and have held off for now and recommending palliative consult for goals of care discussion -Cardiology feels that she may have underlying obstructive CAD but given confusion and difficulty following commands unlikely able to tolerate left heart cath and aggressive cardiovascular care -Cardiology recommending transitioning to hospice care -Troponin went from 821 -> 429 -In the interim cardiology added Imdur 30 mg p.o. daily, amlodipine 5 mg p.o. daily -They have also added hydralazine as needed for systolic blood pressure in 180 -If she continues to have episodes of bradycardia and chest pain cardiology recommends to notify them but they have signed off the case as the plan is for hospice care   CKD stage IIIb, improved  -Baseline creatinine appears to be 1.4 - creatinine steadily climbing - with episodes of orthostasis/bradycardia will hydrate gently and follow trend, being mindful of probable moderate AoS -BUN/Cr Trend: Recent Labs  Lab 12/23/23 0145 12/24/23 1434 12/26/23 0433 12/27/23 0749 12/28/23 0249  BUN 24* 20 30* 24* 22  CREATININE 1.52* 1.61* 1.95* 1.42* 1.51*  -Avoid Nephrotoxic Medications, Contrast Dyes, Hypotension and Dehydration to Ensure Adequate Renal Perfusion and will need to Renally Adjust Meds -Continue to Monitor and Trend Renal Function carefully and will not repeat now that she is going more of Comfort focused   COPD -Well compensated presently SpO2: 96 % O2 Flow Rate (L/min): 2 L/min  CAD / AAA -Continue Plavix statin and nitrates -AAA  was 5.2 cm in 2023   Hypoalbuminemia -Patient's Albumin Trend: Recent Labs  Lab 12/23/23 0145 12/24/23 1434 12/27/23 1014 12/28/23 0249  ALBUMIN 4.1 3.6 3.3* 3.4*  -Continue to Monitor and Trend and repeat CMP in the AM   DVT prophylaxis: None as focus is now Hospice and Comfort Care    Code  Status: Limited: Do not attempt resuscitation (DNR) -DNR-LIMITED -Do Not Intubate/DNI  Family Communication: No family present at bedside   Disposition Plan:  Level of care: Telemetry Cardiac Status is: Inpatient Remains inpatient appropriate because: Needs a safe discharge disposition   Consultants:  Medical Cardiology EP Cardiology Palliative Care Medicine  Procedures:  ECHOCARDIOGRAM IMPRESSIONS     1. Left ventricular ejection fraction, by estimation, is 75%. The left  ventricle has hyperdynamic function. The left ventricle has no regional  wall motion abnormalities. There is mild left ventricular hypertrophy.  Left ventricular diastolic parameters  are indeterminate.   2. Right ventricular systolic function is mildly reduced. The right  ventricular size is normal. Tricuspid regurgitation signal is inadequate  for assessing PA pressure.   3. Right atrial size was mildly dilated.   4. The mitral valve is degenerative. Trivial mitral valve regurgitation.  No evidence of mitral stenosis. Moderate mitral annular calcification.   5. The aortic valve is abnormal and not well visualized. There is  moderate calcification of the aortic valve. Aortic valve regurgitation is  not visualized. Mild-moderate aortic valve stenosis. Aortic valve area, by  VTI measures 1.22 cm. Aortic valve  mean gradient measures 11.4 mmHg. Aortic valve Vmax measures 2.31 m/s. DVI  0.48, SVI 20. Findings may represent paradoxical low flow low gradient AS,  possibly moderate AS based on valve opening. Poor quality LVOT TVI makes  assessment challenging.   6. The inferior vena cava is normal in size with greater than 50%  respiratory variability, suggesting right atrial pressure of 3 mmHg.   FINDINGS   Left Ventricle: Left ventricular ejection fraction, by estimation, is  75%. The left ventricle has hyperdynamic function. The left ventricle has  no regional wall motion abnormalities. The left ventricular  internal  cavity size was normal in size. There is   mild left ventricular hypertrophy. Left ventricular diastolic parameters  are indeterminate.   Right Ventricle: The right ventricular size is normal. Right vetricular  wall thickness was not well visualized. Right ventricular systolic  function is mildly reduced. Tricuspid regurgitation signal is inadequate  for assessing PA pressure.   Left Atrium: Left atrial size was normal in size.   Right Atrium: Right atrial size was mildly dilated.   Pericardium: There is no evidence of pericardial effusion.   Mitral Valve: The mitral valve is degenerative in appearance. Moderate  mitral annular calcification. Trivial mitral valve regurgitation. No  evidence of mitral valve stenosis. MV peak gradient, 4.2 mmHg. The mean  mitral valve gradient is 1.0 mmHg.   Tricuspid Valve: The tricuspid valve is grossly normal. Tricuspid valve  regurgitation is trivial. No evidence of tricuspid stenosis.   Aortic Valve: The aortic valve is abnormal. There is moderate  calcification of the aortic valve. Aortic valve regurgitation is not  visualized. Mild aortic stenosis is present. Aortic valve mean gradient  measures 11.4 mmHg. Aortic valve peak gradient  measures 21.3 mmHg. Aortic valve area, by VTI measures 1.22 cm.   Pulmonic Valve: The pulmonic valve was grossly normal. Pulmonic valve  regurgitation is trivial. No evidence of pulmonic stenosis.   Aorta: The aortic root is normal  in size and structure.   Venous: The inferior vena cava is normal in size with greater than 50%  respiratory variability, suggesting right atrial pressure of 3 mmHg.   IAS/Shunts: The atrial septum is grossly normal.     LEFT VENTRICLE  PLAX 2D  LVIDd:         3.10 cm     Diastology  LVIDs:         2.20 cm     LV e' medial:    4.03 cm/s  LV PW:         1.00 cm     LV E/e' medial:  17.6  LV IVS:        0.90 cm     LV e' lateral:   7.07 cm/s  LVOT diam:     1.80 cm      LV E/e' lateral: 10.0  LV SV:         35  LV SV Index:   20  LVOT Area:     2.54 cm    LV Volumes (MOD)  LV vol d, MOD A2C: 57.4 ml  LV vol d, MOD A4C: 90.6 ml  LV vol s, MOD A2C: 23.0 ml  LV vol s, MOD A4C: 34.5 ml  LV SV MOD A2C:     34.4 ml  LV SV MOD A4C:     90.6 ml  LV SV MOD BP:      40.2 ml   RIGHT VENTRICLE             IVC  RV Basal diam:  2.70 cm     IVC diam: 1.40 cm  RV Mid diam:    2.40 cm  RV S prime:     14.70 cm/s   LEFT ATRIUM             Index        RIGHT ATRIUM           Index  LA diam:        1.80 cm 1.01 cm/m   RA Area:     20.70 cm  LA Vol (A2C):   42.4 ml 23.77 ml/m  RA Volume:   61.40 ml  34.43 ml/m  LA Vol (A4C):   38.1 ml 21.36 ml/m  LA Biplane Vol: 41.1 ml 23.04 ml/m   AORTIC VALVE                     PULMONIC VALVE  AV Area (Vmax):    1.06 cm      PV Vmax:       0.72 m/s  AV Area (Vmean):   1.59 cm      PV Peak grad:  2.1 mmHg  AV Area (VTI):     1.22 cm  AV Vmax:           231.00 cm/s  AV Vmean:          102.370 cm/s  AV VTI:            0.285 m  AV Peak Grad:      21.3 mmHg  AV Mean Grad:      11.4 mmHg  LVOT Vmax:         96.60 cm/s  LVOT Vmean:        64.100 cm/s  LVOT VTI:          0.137 m  LVOT/AV VTI ratio: 0.48    AORTA  Ao Root diam: 2.90  cm  Ao Asc diam:  2.50 cm   MITRAL VALVE  MV Area (PHT): 2.76 cm    SHUNTS  MV Peak grad:  4.2 mmHg    Systemic VTI:  0.14 m  MV Mean grad:  1.0 mmHg    Systemic Diam: 1.80 cm  MV Vmax:       1.03 m/s  MV Vmean:      43.2 cm/s  MV Decel Time: 275 msec  MV E velocity: 70.75 cm/s    Antimicrobials:  Anti-infectives (From admission, onward)    None       Subjective: Seen and examined at bedside remains pleasantly confused.  No nausea or vomiting.  Denies any complaints at this time.  Objective: Vitals:   12/28/23 0236 12/28/23 0534 12/28/23 0844 12/28/23 1200  BP: 101/66 (!) 183/126 (!) 143/90 124/80  Pulse: (!) 101 86 93 100  Resp: 18 18 18 18   Temp: 98.4 F (36.9 C)  98.4 F (36.9 C) (!) 97.5 F (36.4 C) 98 F (36.7 C)  TempSrc: Oral Oral Oral Oral  SpO2: 97% 97% 96% 96%  Weight:  65.7 kg    Height:        Intake/Output Summary (Last 24 hours) at 12/28/2023 1942 Last data filed at 12/28/2023 1724 Gross per 24 hour  Intake 557.77 ml  Output 1000 ml  Net -442.23 ml   Filed Weights   12/23/23 1917 12/27/23 0300 12/28/23 0534  Weight: 69.3 kg 65.2 kg 65.7 kg   Examination: Physical Exam:  Constitutional: Elderly chronically ill-appearing Caucasian female in no acute distress with pleasantly demented and confused Respiratory: Diminished to auscultation bilaterally, no wheezing, rales, rhonchi or crackles. Normal respiratory effort and patient is not tachypenic. No accessory muscle use.  Unlabored breathing Cardiovascular: Irregularly irregular slightly tachycardic, no murmurs / rubs / gallops. S1 and S2 auscultated. No extremity edema. Abdomen: Soft, non-tender, non-distended. Bowel sounds positive.  GU: Deferred. Musculoskeletal: No clubbing / cyanosis of digits/nails. No joint deformity upper and lower extremities.  Skin: No rashes, lesions, ulcers limited skin evaluation. No induration; Warm and dry.  Neurologic: CN 2-12 grossly intact with no focal deficits but appears confused.  Romberg sign cerebellar reflexes not assessed.  Psychiatric: Impaired judgment insight and she is alert and oriented only x 1 pleasantly demented  Data Reviewed: I have personally reviewed following labs and imaging studies  CBC: Recent Labs  Lab 12/23/23 0145 12/24/23 1434 12/26/23 0433 12/27/23 0749 12/28/23 0249  WBC 5.5 5.9 6.2 5.2 5.5  NEUTROABS 3.0  --   --   --  3.2  HGB 13.8 14.5 14.5 13.8 13.4  HCT 41.8 43.1 43.7 42.6 40.6  MCV 91.3 89.0 90.3 92.2 91.4  PLT 212 245 199 205 196   Basic Metabolic Panel: Recent Labs  Lab 12/23/23 0145 12/23/23 0522 12/24/23 1434 12/26/23 0433 12/27/23 0749 12/27/23 1014 12/28/23 0249  NA 142  --  140 138  141  --  142  K 3.7  --  3.7 3.3* 4.5  --  3.9  CL 106  --  104 103 107  --  107  CO2 24  --  23 23 24   --  26  GLUCOSE 103*  --  106* 111* 112*  --  107*  BUN 24*  --  20 30* 24*  --  22  CREATININE 1.52*  --  1.61* 1.95* 1.42*  --  1.51*  CALCIUM 9.9  --  9.8 9.5 9.3  --  9.3  MG  --  1.9  --   --   --  1.7 1.9  PHOS  --  2.8  --   --   --  2.9 2.9   GFR: Estimated Creatinine Clearance: 23.6 mL/min (A) (by C-G formula based on SCr of 1.51 mg/dL (H)). Liver Function Tests: Recent Labs  Lab 12/23/23 0145 12/24/23 1434 12/27/23 1014 12/28/23 0249  AST 25 37 30 28  ALT 14 16 19 19   ALKPHOS 32* 30* 30* 30*  BILITOT 0.7 0.9 0.6 0.5  PROT 6.5 5.9* 5.7* 5.8*  ALBUMIN 4.1 3.6 3.3* 3.4*   Recent Labs  Lab 12/23/23 0145  LIPASE 56*   Recent Labs  Lab 12/23/23 0522  AMMONIA 27   Coagulation Profile: No results for input(s): "INR", "PROTIME" in the last 168 hours. Cardiac Enzymes: No results for input(s): "CKTOTAL", "CKMB", "CKMBINDEX", "TROPONINI" in the last 168 hours. BNP (last 3 results) No results for input(s): "PROBNP" in the last 8760 hours. HbA1C: No results for input(s): "HGBA1C" in the last 72 hours. CBG: Recent Labs  Lab 12/24/23 1606 12/24/23 2333  GLUCAP 86 123*   Lipid Profile: No results for input(s): "CHOL", "HDL", "LDLCALC", "TRIG", "CHOLHDL", "LDLDIRECT" in the last 72 hours. Thyroid Function Tests: No results for input(s): "TSH", "T4TOTAL", "FREET4", "T3FREE", "THYROIDAB" in the last 72 hours. Anemia Panel: No results for input(s): "VITAMINB12", "FOLATE", "FERRITIN", "TIBC", "IRON", "RETICCTPCT" in the last 72 hours. Sepsis Labs: No results for input(s): "PROCALCITON", "LATICACIDVEN" in the last 168 hours.  Recent Results (from the past 240 hours)  Resp panel by RT-PCR (RSV, Flu A&B, Covid) Anterior Nasal Swab     Status: None   Collection Time: 12/23/23  2:10 AM   Specimen: Anterior Nasal Swab  Result Value Ref Range Status   SARS Coronavirus  2 by RT PCR NEGATIVE NEGATIVE Final   Influenza A by PCR NEGATIVE NEGATIVE Final   Influenza B by PCR NEGATIVE NEGATIVE Final    Comment: (NOTE) The Xpert Xpress SARS-CoV-2/FLU/RSV plus assay is intended as an aid in the diagnosis of influenza from Nasopharyngeal swab specimens and should not be used as a sole basis for treatment. Nasal washings and aspirates are unacceptable for Xpert Xpress SARS-CoV-2/FLU/RSV testing.  Fact Sheet for Patients: BloggerCourse.com  Fact Sheet for Healthcare Providers: SeriousBroker.it  This test is not yet approved or cleared by the [place] FDA and has been authorized for detection and/or diagnosis of SARS-CoV-2 by FDA under an Emergency Use Authorization (EUA). This EUA will remain in effect (meaning this test can be used) for the duration of the COVID-19 declaration under Section 564(b)(1) of the Act, 21 U.S.C. section 360bbb-3(b)(1), unless the authorization is terminated or revoked.     Resp Syncytial Virus by PCR NEGATIVE NEGATIVE Final    Comment: (NOTE) Fact Sheet for Patients: BloggerCourse.com  Fact Sheet for Healthcare Providers: SeriousBroker.it  This test is not yet approved or cleared by the [place] FDA and has been authorized for detection and/or diagnosis of SARS-CoV-2 by FDA under an Emergency Use Authorization (EUA). This EUA will remain in effect (meaning this test can be used) for the duration of the COVID-19 declaration under Section 564(b)(1) of the Act, 21 U.S.C. section 360bbb-3(b)(1), unless the authorization is terminated or revoked.  Performed at [facility], [address], [place], [place] [address]     Radiology Studies: No results found.  Scheduled Meds:  amLODipine  5 mg Oral Daily   docusate sodium  100 mg  Oral BID   isosorbide mononitrate  30 mg Oral Daily   pantoprazole  40 mg Oral  Daily   pravastatin  80 mg Oral q1800   senna  1 tablet Oral Q breakfast   sodium chloride flush  3 mL Intravenous Q12H   Continuous Infusions:   LOS: 4 days   Marguerita Merles, DO [facility] Available via Epic secure chat 7am-7pm After these hours, please refer to coverage provider listed on [email or web address] 12/28/2023, 7:42 PM

## 2023-12-29 DIAGNOSIS — G9340 Encephalopathy, unspecified: Secondary | ICD-10-CM | POA: Diagnosis not present

## 2023-12-29 DIAGNOSIS — I2489 Other forms of acute ischemic heart disease: Secondary | ICD-10-CM

## 2023-12-29 DIAGNOSIS — I4891 Unspecified atrial fibrillation: Secondary | ICD-10-CM | POA: Diagnosis not present

## 2023-12-29 DIAGNOSIS — N1832 Chronic kidney disease, stage 3b: Secondary | ICD-10-CM | POA: Diagnosis not present

## 2023-12-29 DIAGNOSIS — R410 Disorientation, unspecified: Secondary | ICD-10-CM | POA: Diagnosis not present

## 2023-12-29 MED ORDER — ISOSORBIDE MONONITRATE ER 30 MG PO TB24: 30.0000 mg | ORAL_TABLET | Freq: Every day | ORAL | Status: AC

## 2023-12-29 MED ORDER — SENNOSIDES-DOCUSATE SODIUM 8.6-50 MG PO TABS: 1.0000 | ORAL_TABLET | Freq: Every evening | ORAL | Status: AC | PRN

## 2023-12-29 MED ORDER — AMLODIPINE BESYLATE 5 MG PO TABS: 5.0000 mg | ORAL_TABLET | Freq: Every day | ORAL | Status: AC

## 2023-12-29 NOTE — Progress Notes (Signed)
Handoff report given to [facility] staff Lawanna Kobus RN

## 2023-12-29 NOTE — Discharge Summary (Signed)
Physician Discharge Summary   Patient: Gina Bond MRN: [id number] DOB: Apr 16, 1932  Admit date:     12/23/2023  Discharge date: 12/29/23  Discharge Physician: Marguerita Merles, DO    PCP: Verlon Au, MD   Recommendations at discharge:   Follow-up care per hospice protocol at SNF Follow-up with cardiology in outpatient setting if deemed necessary  Discharge Diagnoses: Principal Problem:   Acute encephalopathy Active Problems:   COPD (chronic obstructive pulmonary disease) (HCC)   Primary hypertension   Chronic kidney disease, stage 3b (HCC)   Coronary artery disease involving native coronary artery of native heart without angina pectoris   Memory loss   Hypertensive crisis   Acute delirium   Paroxysmal atrial fibrillation (HCC)   Troponin level elevated   Atrial fibrillation with RVR (HCC)   Demand ischemia (HCC)  Resolved Problems:   * No resolved hospital problems. [facility] Course: The patient is an elderly 89 year old Caucasian female with a past medical history significant for but not limited to history of hypertension, hyperlipidemia, CKD stage IIIb, CAD, AAA, COPD, history of memory loss and other comorbidities who presented to the ED on 12/22/2022 with increasing confusion and agitation as well as associated hallucinations for last few days.  The patient herself had no complaints per family and appreciated the changes and family reported no falls or injuries or any medications.  In the ED head CT was negative for acute findings.  While she was hospitalized she episodes of chest pain and rapid heart rate and was foun to have an elevated troponin which trended up to 962.  Cardiology was consulted and she continued to have episodes of unresponsiveness and lethargy and also found to have atrial fibrillation with slow ventricular response with concerns for tachycardia-bradycardia syndrome.  Cardiology now feels that she may not be able to tolerate a cardiac cath  given her confusion.  They recommended palliative consultation and palliative is now evaluated and they are recommending hospice care as this is the recommendation of the cardiologist as well.  Patient does not meet inpatient residential hospice criteria as she has a prognosis deemed to be greater than 2 weeks.  She will go to SNF with hospice at this time and patient's daughter is in agreement.  She is medically stable transferred at this time and will have further care per hospice protocol at the SNF.  Assessment and Plan:  Tachy/Bradycardia Syndrome Symptomatic Bradycardia -The patient has had multiple episodes of increasing frequency over the last 48 hours during which her blood pressure and heart rate dropped and she became presyncopal with chest pain - -Had discontinued any vasodilators that were being used for blood pressure control as well as negative chronotropic medications  -TTE 1/20 noted EF 75% with no WMA and mild LV hypertrophy with mild to moderate aortic valve stenosis  -EP has been asked to see the patient to consider pacemaker placement -see further discussion as per newly diagnosed A-fib -Cards was to do a cardiac cath prior to EP evaluation but they do not feel that she will tolerate this given her ability to follow commands -He has had episodes of unresponsiveness associated with her A-fib with slow ventricular response and currently is being considered for pacemaker but may not be a candidate if she is not a cath candidate -Mains anticoag with heparin drip and will continue to have goals of care discussion palliative consulted for further evaluation -Palliative spoke with daughter. DNR decided. Continue management with medications. Needs placement with  hospice.  And the family is aware of this and will be private pay and per palliative after the discussion with the daughter, the daughter is very realistic and just wants her mother comfortable and safe. She has experience with  hospice with other family members.  -Patient is to pursue hospice care with SNF placement as she is not a candidate for residential hospice given that she is greater than 2 weeks likely and now has a bed available   Newly diagnosed Afib  -EKG consistent with true atrial fibrillation - given her altered mental status and attendant high risk for falls she is not a good candidate for anticoagulation long-term, but I feel it is reasonable during her inpatient stay  -Magnesium is normal -there is no evidence of hyperthyroidism -Cardiology is following -rate control will be difficult in setting of tachy/brady until she has a pacemaker pacemaker will not be pursued given that the plan is for hospice care SNF   Acute v/s subacute encephalopathy of unclear etiology - acute delirium of unknown etiology not responding to treatment - possible hypertensive encephalopathy but most likely she has vascular dementia -CT head unrevealing - no clinical evidence of acute infection - no focal neurologic deficits - B12 is >400 -  -TSH elevated but not markedly so and free T4 is normal - respiratory virus panel negative -  -UA unrevealing - UDS negative - alcohol level <10 - ammonia normal -RPR nonreactive  - MRI brain notes no acute findings but did reveal age-related atrophy with mild chronic small vessel ischemic change and right mastoid effusion - exam of right ear reveals no evidence of acute otitis media and patient has no symptoms to suggest this - -Dr. Sharon Seller had spoken with the patient's daughter extensively -the description of the patient's behavior at home suggest that she is suffering with a progressive dementia -evaluation suggest this may be vascular in etiology  -She is experiencing some hallucinations -the patient's daughter and I agree that she is no longer safe to live at home independently  -The patient herself does not desire facility placement but she is no longer capable of making informed medical  decisions given her cognitive decline -medical therapy will be initiated in an attempt to stabilize her mood -Palliative Care Consulted for GOC Discussion and plan is for hospice care and pursuing placement with hospice support at SNF   Right mastoid effusion -Incidentally noted on MRI head - no clinical evidence to suggest active otitis media with otoscope exam 1/20 unrevealing - no signs of systemic infection -Not workup now that plan is for hospice care and pursuing placement with hospice support   Uncontrolled Hypertension  Labile Hypertension -Blood pressure 217/93 at presentation -titration of medications was carried out with blood pressure slowly improving initially then rapidly changing to the point of relative hypotension with orthostatic symptoms, accompanied and likely brought about by significant bradycardia  -decreased blood pressure medications 1/20 - present goal is for permissive hypertension but SBP <180 -at this point I think hypertensive encephalopathy  -Continue monitor blood pressures per protocol -Last blood pressure reading is now 146/109   NSTEMI with Elevated Troponin and Chest Discomfort -Likely related to atrial fibrillation and periods of severe bradycardia -Plan was for cardiac catheterization but cardiology is concerned about her ability to tolerate left heart cath and have held off for now and recommending palliative consult for goals of care discussion -Cardiology feels that she may have underlying obstructive CAD but given confusion and difficulty following commands unlikely  able to tolerate left heart cath and aggressive cardiovascular care -Cardiology recommending transitioning to hospice care -Troponin went from 821 -> 429 -In the interim cardiology added Imdur 30 mg p.o. daily, amlodipine 5 mg p.o. daily -They have also added hydralazine as needed for systolic blood pressure in 180 -If she continues to have episodes of bradycardia and chest pain cardiology  recommends to notify them but they have signed off the case as the plan is for hospice care now and recommending continuing Imdur 30 mg p.o. daily and Norvasc 5 mg p.o. daily   CKD stage IIIb, improved  -Baseline creatinine appears to be 1.4 - creatinine steadily climbing - with episodes of orthostasis/bradycardia will hydrate gently and follow trend, being mindful of probable moderate AoS -BUN/Cr Trend: Recent Labs  Lab 12/23/23 0145 12/24/23 1434 12/26/23 0433 12/27/23 0749 12/28/23 0249  BUN 24* 20 30* 24* 22  CREATININE 1.52* 1.61* 1.95* 1.42* 1.51*  -Avoid Nephrotoxic Medications, Contrast Dyes, Hypotension and Dehydration to Ensure Adequate Renal Perfusion and will need to Renally Adjust Meds -Continue to Monitor and Trend Renal Function carefully and will not repeat now that she is going more of Comfort focused   COPD -Well compensated presently SpO2: 96 % O2 Flow Rate (L/min): 2 L/min  CAD / AAA -Continue Plavix statin and nitrates -AAA was 5.2 cm in 2023   Hypoalbuminemia -Patient's Albumin Trend: Recent Labs  Lab 12/23/23 0145 12/24/23 1434 12/27/23 1014 12/28/23 0249  ALBUMIN 4.1 3.6 3.3* 3.4*  -Continue to Monitor and Trend and repeat CMP in the AM  Consultants: Cardiology, palliative care Procedures performed: As delineated as above Disposition: Skilled nursing facility with Hospice Support  Diet recommendation:  Cardiac diet DISCHARGE MEDICATION: Allergies as of 12/29/2023       Reactions   Benicar [olmesartan] Shortness Of Breath   Corticosteroids Shortness Of Breath   Steroid inhalers   Other Shortness Of Breath, Rash, Other (See Comments)   Wool = Rashes Certain dyes; Fillers used by some manufacturers   Spironolactone Shortness Of Breath   Sulfa Antibiotics Hives   Tricor [fenofibrate] Shortness Of Breath, Other (See Comments)   AMNEAL manufacturer only, causes shortness of breath otherwise.   Crestor [rosuvastatin Calcium] Other (See  Comments)   Myalgias   Klaron [sulfacetamide] Hives   Lipitor [atorvastatin] Other (See Comments)   Myalgias   Magnesium Other (See Comments)   Myalgias   Statins Other (See Comments)   Myalgias    Zofran [ondansetron] Other (See Comments)   Disorientation    Bactrim [sulfamethoxazole-trimethoprim] Hives, Rash   Tobrex [tobramycin] Rash, Other (See Comments)   Blurred vision        Medication List     STOP taking these medications    clopidogrel 75 MG tablet Commonly known as: PLAVIX   hydrALAZINE 50 MG tablet Commonly known as: APRESOLINE   isosorbide dinitrate 30 MG tablet Commonly known as: ISORDIL   nebivolol 10 MG tablet Commonly known as: BYSTOLIC   senna 8.6 MG tablet Commonly known as: SENOKOT       TAKE these medications    acetaminophen 500 MG tablet Commonly known as: TYLENOL Take 500-1,000 mg by mouth every 6 (six) hours as needed for moderate pain (pain score 4-6), fever or headache.   albuterol 108 (90 Base) MCG/ACT inhaler Commonly known as: VENTOLIN HFA Inhale 2 puffs into the lungs every 6 (six) hours as needed for wheezing or shortness of breath.   amLODipine 5 MG tablet Commonly known as: NORVASC  Take 1 tablet (5 mg total) by mouth daily. Start taking on: December 30, 2023   Centrum Silver 50+Women Tabs Take 1 tablet by mouth daily with breakfast.   fenofibrate micronized 134 MG capsule Commonly known as: LOFIBRA Take 134 mg by mouth daily after supper. Alembic   isosorbide mononitrate 30 MG 24 hr tablet Commonly known as: IMDUR Take 1 tablet (30 mg total) by mouth daily. Start taking on: December 30, 2023   nitroGLYCERIN 0.4 MG SL tablet Commonly known as: NITROSTAT Place 1 tablet (0.4 mg total) under the tongue every 5 (five) minutes as needed for chest pain.   pantoprazole 40 MG tablet Commonly known as: PROTONIX Take 1 tablet (40 mg total) by mouth daily.   pravastatin 80 MG tablet Commonly known as: PRAVACHOL TAKE 1  TABLET(80 MG) BY MOUTH DAILY AT 6 PM   senna-docusate 8.6-50 MG tablet Commonly known as: Senokot-S Take 1 tablet by mouth at bedtime as needed for mild constipation.   VITAMIN D-3 PO Take 1 capsule by mouth every evening.        Contact information for after-discharge care     Destination     [facility] .   Service: Skilled Nursing Contact information: [address] [place] [address] [phone]                    Discharge Exam: Ceasar Mons Weights   12/23/23 1917 12/27/23 0300 12/28/23 0534  Weight: 69.3 kg 65.2 kg 65.7 kg   Vitals:   12/29/23 0730 12/29/23 1040  BP: (!) 147/125 (!) 146/109  Pulse: 92 90  Resp: 20 20  Temp: 97.9 F (36.6 C) 97.9 F (36.6 C)  SpO2: 96% 96%   Examination: Physical Exam:  Constitutional: Elderly chronically ill-appearing Caucasian female in NAD Respiratory: Diminished to auscultation bilaterally, no wheezing, rales, rhonchi or crackles. Normal respiratory effort and patient is not tachypenic. No accessory muscle use. Unlabored breathing  Cardiovascular: Irregularly Irregular, no murmurs / rubs / gallops. S1 and S2 auscultated. No extremity edema  Abdomen: Soft, non-tender, non-distended. Bowel sounds positive.  GU: Deferred. Musculoskeletal: No clubbing / cyanosis of digits/nails. No joint deformity upper and lower extremities.  Skin: No rashes, lesions, ulcers on a limited skin evaluation. No induration; Warm and dry.  Neurologic: CN 2-12 grossly intact with no focal deficits but still a little confused.  Romberg sign and cerebellar reflexes not assessed.  Psychiatric: Impaired judgment and insight. Alert and oriented x 2 and is pleasantly demented   Condition at discharge: stable  The results of significant diagnostics from this hospitalization (including imaging, microbiology, ancillary and laboratory) are listed below for reference.   Imaging Studies: ECHOCARDIOGRAM  COMPLETE Result Date: 12/25/2023    ECHOCARDIOGRAM REPORT   Patient Name:   TYRELL BRERETON Date of Exam: 12/25/2023 Medical Rec #:  [id number]            Height:       66.0 in Accession #:    [id number]           Weight:       152.8 lb Date of Birth:  Nov 19, 1932           BSA:          1.784 m Patient Age:    89 years             BP:           114/70 mmHg Patient Gender: F  HR:           98 bpm. Exam Location:  Inpatient Procedure: 2D Echo, Cardiac Doppler and Color Doppler Indications:    NSTEMI  History:        Patient has prior history of Echocardiogram examinations, most                 recent 02/16/2022. CAD, COPD, Arrythmias:Atrial Fibrillation,                 Signs/Symptoms:Chest Pain and Dyspnea; Risk Factors:Dyslipidemia                 and Hypertension. CKD.  Sonographer:    Vern Claude Referring Phys: [id number] [place] THOMAS O'NEAL  Sonographer Comments: Pt persistently moving. IMPRESSIONS  1. Left ventricular ejection fraction, by estimation, is 75%. The left ventricle has hyperdynamic function. The left ventricle has no regional wall motion abnormalities. There is mild left ventricular hypertrophy. Left ventricular diastolic parameters are indeterminate.  2. Right ventricular systolic function is mildly reduced. The right ventricular size is normal. Tricuspid regurgitation signal is inadequate for assessing PA pressure.  3. Right atrial size was mildly dilated.  4. The mitral valve is degenerative. Trivial mitral valve regurgitation. No evidence of mitral stenosis. Moderate mitral annular calcification.  5. The aortic valve is abnormal and not well visualized. There is moderate calcification of the aortic valve. Aortic valve regurgitation is not visualized. Mild-moderate aortic valve stenosis. Aortic valve area, by VTI measures 1.22 cm. Aortic valve mean gradient measures 11.4 mmHg. Aortic valve Vmax measures 2.31 m/s. DVI 0.48, SVI 20. Findings may represent paradoxical low flow  low gradient AS, possibly moderate AS based on valve opening. Poor quality LVOT TVI makes assessment challenging.  6. The inferior vena cava is normal in size with greater than 50% respiratory variability, suggesting right atrial pressure of 3 mmHg. FINDINGS  Left Ventricle: Left ventricular ejection fraction, by estimation, is 75%. The left ventricle has hyperdynamic function. The left ventricle has no regional wall motion abnormalities. The left ventricular internal cavity size was normal in size. There is  mild left ventricular hypertrophy. Left ventricular diastolic parameters are indeterminate. Right Ventricle: The right ventricular size is normal. Right vetricular wall thickness was not well visualized. Right ventricular systolic function is mildly reduced. Tricuspid regurgitation signal is inadequate for assessing PA pressure. Left Atrium: Left atrial size was normal in size. Right Atrium: Right atrial size was mildly dilated. Pericardium: There is no evidence of pericardial effusion. Mitral Valve: The mitral valve is degenerative in appearance. Moderate mitral annular calcification. Trivial mitral valve regurgitation. No evidence of mitral valve stenosis. MV peak gradient, 4.2 mmHg. The mean mitral valve gradient is 1.0 mmHg. Tricuspid Valve: The tricuspid valve is grossly normal. Tricuspid valve regurgitation is trivial. No evidence of tricuspid stenosis. Aortic Valve: The aortic valve is abnormal. There is moderate calcification of the aortic valve. Aortic valve regurgitation is not visualized. Mild aortic stenosis is present. Aortic valve mean gradient measures 11.4 mmHg. Aortic valve peak gradient measures 21.3 mmHg. Aortic valve area, by VTI measures 1.22 cm. Pulmonic Valve: The pulmonic valve was grossly normal. Pulmonic valve regurgitation is trivial. No evidence of pulmonic stenosis. Aorta: The aortic root is normal in size and structure. Venous: The inferior vena cava is normal in size with greater  than 50% respiratory variability, suggesting right atrial pressure of 3 mmHg. IAS/Shunts: The atrial septum is grossly normal.  LEFT VENTRICLE PLAX 2D LVIDd:  3.10 cm     Diastology LVIDs:         2.20 cm     LV e' medial:    4.03 cm/s LV PW:         1.00 cm     LV E/e' medial:  17.6 LV IVS:        0.90 cm     LV e' lateral:   7.07 cm/s LVOT diam:     1.80 cm     LV E/e' lateral: 10.0 LV SV:         35 LV SV Index:   20 LVOT Area:     2.54 cm  LV Volumes (MOD) LV vol d, MOD A2C: 57.4 ml LV vol d, MOD A4C: 90.6 ml LV vol s, MOD A2C: 23.0 ml LV vol s, MOD A4C: 34.5 ml LV SV MOD A2C:     34.4 ml LV SV MOD A4C:     90.6 ml LV SV MOD BP:      40.2 ml RIGHT VENTRICLE             IVC RV Basal diam:  2.70 cm     IVC diam: 1.40 cm RV Mid diam:    2.40 cm RV S prime:     14.70 cm/s LEFT ATRIUM             Index        RIGHT ATRIUM           Index LA diam:        1.80 cm 1.01 cm/m   RA Area:     20.70 cm LA Vol (A2C):   42.4 ml 23.77 ml/m  RA Volume:   61.40 ml  34.43 ml/m LA Vol (A4C):   38.1 ml 21.36 ml/m LA Biplane Vol: 41.1 ml 23.04 ml/m  AORTIC VALVE                     PULMONIC VALVE AV Area (Vmax):    1.06 cm      PV Vmax:       0.72 m/s AV Area (Vmean):   1.59 cm      PV Peak grad:  2.1 mmHg AV Area (VTI):     1.22 cm AV Vmax:           231.00 cm/s AV Vmean:          102.370 cm/s AV VTI:            0.285 m AV Peak Grad:      21.3 mmHg AV Mean Grad:      11.4 mmHg LVOT Vmax:         96.60 cm/s LVOT Vmean:        64.100 cm/s LVOT VTI:          0.137 m LVOT/AV VTI ratio: 0.48  AORTA Ao Root diam: 2.90 cm Ao Asc diam:  2.50 cm MITRAL VALVE MV Area (PHT): 2.76 cm    SHUNTS MV Peak grad:  4.2 mmHg    Systemic VTI:  0.14 m MV Mean grad:  1.0 mmHg    Systemic Diam: 1.80 cm MV Vmax:       1.03 m/s MV Vmean:      43.2 cm/s MV Decel Time: 275 msec MV E velocity: 70.75 cm/s Weston Brass MD Electronically signed by Weston Brass MD Signature Date/Time: 12/25/2023/9:19:30 PM    Final    MR BRAIN WO  CONTRAST Result Date:  12/24/2023 CLINICAL DATA:  Delirium EXAM: MRI HEAD WITHOUT CONTRAST TECHNIQUE: Multiplanar, multiecho pulse sequences of the brain and surrounding structures were obtained without intravenous contrast. COMPARISON:  Head CT yesterday.  MRI 04/16/2022. FINDINGS: Brain: Diffusion imaging does not show any acute or subacute infarction or other cause of restricted diffusion. No focal abnormality affects the brainstem or cerebellum. Cerebral hemispheres show age related atrophy with mild chronic small-vessel ischemic change of the white matter. No sign of acute infarction, mass lesion, hemorrhage, hydrocephalus or extra-axial collection. CP angle regions appear normal. Vascular: Major vessels at the base of the brain show flow. Skull and upper cervical spine: Negative Sinuses/Orbits: Paranasal sinuses are clear. Orbits appear negative. Patient does have a mastoid effusion on the right. Other: None IMPRESSION: 1. No acute or reversible finding. Age related atrophy with mild chronic small-vessel ischemic change of the cerebral hemispheric white matter. 2. Right mastoid effusion. Electronically Signed   By: Paulina Fusi M.D.   On: 12/24/2023 18:52   CT Head Wo Contrast Result Date: 12/23/2023 CLINICAL DATA:  Altered mental status EXAM: CT HEAD WITHOUT CONTRAST TECHNIQUE: Contiguous axial images were obtained from the base of the skull through the vertex without intravenous contrast. RADIATION DOSE REDUCTION: This exam was performed according to the departmental dose-optimization program which includes automated exposure control, adjustment of the mA and/or kV according to patient size and/or use of iterative reconstruction technique. COMPARISON:  03/08/2023 FINDINGS: Brain: There is no mass, hemorrhage or extra-axial collection. The appearance of the white matter is normal for the patient's age. There is generalized atrophy. Vascular: No hyperdense vessel or unexpected vascular calcification. Skull:  The visualized skull base, calvarium and extracranial soft tissues are normal. Sinuses/Orbits: No fluid levels or advanced mucosal thickening of the visualized paranasal sinuses. No mastoid or middle ear effusion. Normal orbits. Other: None. IMPRESSION: No acute intracranial abnormality. Electronically Signed   By: Deatra Robinson M.D.   On: 12/23/2023 01:31   Microbiology: Results for orders placed or performed during the hospital encounter of 12/23/23  Resp panel by RT-PCR (RSV, Flu A&B, Covid) Anterior Nasal Swab     Status: None   Collection Time: 12/23/23  2:10 AM   Specimen: Anterior Nasal Swab  Result Value Ref Range Status   SARS Coronavirus 2 by RT PCR NEGATIVE NEGATIVE Final   Influenza A by PCR NEGATIVE NEGATIVE Final   Influenza B by PCR NEGATIVE NEGATIVE Final    Comment: (NOTE) The Xpert Xpress SARS-CoV-2/FLU/RSV plus assay is intended as an aid in the diagnosis of influenza from Nasopharyngeal swab specimens and should not be used as a sole basis for treatment. Nasal washings and aspirates are unacceptable for Xpert Xpress SARS-CoV-2/FLU/RSV testing.  Fact Sheet for Patients: BloggerCourse.com  Fact Sheet for Healthcare Providers: SeriousBroker.it  This test is not yet approved or cleared by the [place] FDA and has been authorized for detection and/or diagnosis of SARS-CoV-2 by FDA under an Emergency Use Authorization (EUA). This EUA will remain in effect (meaning this test can be used) for the duration of the COVID-19 declaration under Section 564(b)(1) of the Act, 21 U.S.C. section 360bbb-3(b)(1), unless the authorization is terminated or revoked.     Resp Syncytial Virus by PCR NEGATIVE NEGATIVE Final    Comment: (NOTE) Fact Sheet for Patients: BloggerCourse.com  Fact Sheet for Healthcare Providers: SeriousBroker.it  This test is not yet approved or cleared  by the [place] FDA and has been authorized for detection and/or diagnosis of SARS-CoV-2 by FDA under  an Emergency Use Authorization (EUA). This EUA will remain in effect (meaning this test can be used) for the duration of the COVID-19 declaration under Section 564(b)(1) of the Act, 21 U.S.C. section 360bbb-3(b)(1), unless the authorization is terminated or revoked.  Performed at [facility], [address], [place], [place] [address]    Labs: CBC: Recent Labs  Lab 12/23/23 0145 12/24/23 1434 12/26/23 0433 12/27/23 0749 12/28/23 0249  WBC 5.5 5.9 6.2 5.2 5.5  NEUTROABS 3.0  --   --   --  3.2  HGB 13.8 14.5 14.5 13.8 13.4  HCT 41.8 43.1 43.7 42.6 40.6  MCV 91.3 89.0 90.3 92.2 91.4  PLT 212 245 199 205 196   Basic Metabolic Panel: Recent Labs  Lab 12/23/23 0145 12/23/23 0522 12/24/23 1434 12/26/23 0433 12/27/23 0749 12/27/23 1014 12/28/23 0249  NA 142  --  140 138 141  --  142  K 3.7  --  3.7 3.3* 4.5  --  3.9  CL 106  --  104 103 107  --  107  CO2 24  --  23 23 24   --  26  GLUCOSE 103*  --  106* 111* 112*  --  107*  BUN 24*  --  20 30* 24*  --  22  CREATININE 1.52*  --  1.61* 1.95* 1.42*  --  1.51*  CALCIUM 9.9  --  9.8 9.5 9.3  --  9.3  MG  --  1.9  --   --   --  1.7 1.9  PHOS  --  2.8  --   --   --  2.9 2.9   Liver Function Tests: Recent Labs  Lab 12/23/23 0145 12/24/23 1434 12/27/23 1014 12/28/23 0249  AST 25 37 30 28  ALT 14 16 19 19   ALKPHOS 32* 30* 30* 30*  BILITOT 0.7 0.9 0.6 0.5  PROT 6.5 5.9* 5.7* 5.8*  ALBUMIN 4.1 3.6 3.3* 3.4*   CBG: Recent Labs  Lab 12/24/23 1606 12/24/23 2333  GLUCAP 86 123*   Discharge time spent: greater than 30 minutes.  Signed: Marguerita Merles, DO [facility] 12/29/2023

## 2023-12-29 NOTE — Plan of Care (Signed)

## 2023-12-29 NOTE — TOC Transition Note (Signed)
Transition of Care [facility]) - Discharge Note   Patient Details  Name: Gina Bond MRN: [id number] Date of Birth: [birth year]  Transition of Care [facility]) CM/SW Contact:  Michaela Corner, LCSWA Phone Number: 12/29/2023, 1:42 PM   Clinical Narrative:   Patient will DC to: [facility] Anticipated DC date: 12/29/2023 Family notified: [name] (dtr) Transport by: ptar   Per MD patient ready for DC to [facility]. RN to call report prior to discharge [phone]; room 142 ). RN, patient, patient's family, and facility notified of DC. Discharge Summary and FL2 sent to facility. DC packet on chart. Ambulance transport requested for patient.   CSW will sign off for now as social work intervention is no longer needed. Please consult [place] again if new needs arise.      Final next level of care: Skilled Nursing Facility Barriers to Discharge: Barriers Resolved   Patient Goals and CMS Choice Patient states their goals for this hospitalization and ongoing recovery are:: Unable to assess          Discharge Placement              Patient chooses bed at: [facility] Patient to be transferred to facility by: Ptar Name of family member notified: [name] (dtr) Patient and family notified of of transfer: 12/29/23  Discharge Plan and Services Additional resources added to the After Visit Summary for   In-house Referral: Clinical Social Work                                   Social Drivers of Health (SDOH) Interventions SDOH Screenings   Food Insecurity: Patient Unable To Answer (12/24/2023)  Housing: Patient Unable To Answer (12/24/2023)  Transportation Needs: Patient Unable To Answer (12/24/2023)  Utilities: Low Risk  (11/30/2023)   Received from [facility]  Social Connections: Patient Unable To Answer (12/24/2023)  Tobacco Use: Medium Risk (12/23/2023)     Readmission Risk Interventions    08/20/2021    1:02 PM   Readmission Risk Prevention Plan  Transportation Screening Complete  PCP or Specialist Appt within 5-7 Days Complete  Home Care Screening Complete  Medication Review (RN CM) Complete

## 2023-12-29 NOTE — Plan of Care (Signed)
Pt continues to have high blood pressures requiring prn hydralazine

## 2023-12-29 NOTE — Progress Notes (Signed)
Patient discharged via [facility] in stable condition.

## 2023-12-29 NOTE — TOC Progression Note (Signed)
Transition of Care [facility]) - Progression Note    Patient Details  Name: Gina Bond MRN: [id number] Date of Birth: 05-23-1932  Transition of Care [facility]) CM/SW Contact  Michaela Corner, [place] Phone Number: 12/29/2023, 12:36 PM  Clinical Narrative:   8:11AM: CSW spoke with pts dtr about dc plan today. [name] inquired about her mother going to [facility] for SNF. CSW reached out to [place] from [facility] about placement and Wille Celeste stated she would contact CSW after morning meeting.   10:02AM: [name] contacted CSW with updated decision on SNF placement. [name] is agreeable to [name] at this time and [facility] will following. [name] is aware that SNF w/ hospice is private pay and contacted [facility] business office to arrange financial plan. CSW notified MD that [name] is able to accept pt today.   Expected Discharge Plan: Hospice Medical Facility Barriers to Discharge: No Barriers Identified  Expected Discharge Plan and Services In-house Referral: Clinical Social Work     Living arrangements for the past 2 months: Independent Living Facility                                       Social Determinants of Health (SDOH) Interventions SDOH Screenings   Food Insecurity: Patient Unable To Answer (12/24/2023)  Housing: Patient Unable To Answer (12/24/2023)  Transportation Needs: Patient Unable To Answer (12/24/2023)  Utilities: Low Risk  (11/30/2023)   Received from [facility]  Social Connections: Patient Unable To Answer (12/24/2023)  Tobacco Use: Medium Risk (12/23/2023)    Readmission Risk Interventions    08/20/2021    1:02 PM  Readmission Risk Prevention Plan  Transportation Screening Complete  PCP or Specialist Appt within 5-7 Days Complete  Home Care Screening Complete  Medication Review (RN CM) Complete
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
## 2024-01-08 ENCOUNTER — Ambulatory Visit: Payer: Medicare Other | Admitting: Cardiology

## 2024-01-08 NOTE — Progress Notes (Deleted)
 Cardiology Office Note:  .   Date:  01/08/2024  ID:  Gina Bond, DOB [birth year], MRN [id number] PCP: Verlon Au, MD  [facility] Providers Cardiologist:  None { Click to update primary MD,subspecialty MD or APP then REFRESH:1}  History of Present Illness: .   Gina Bond is a 89 y.o.  Female with COPD, prior tobacco use disorder, large abdominal aortic aneurysm measuring 5 x 5 cm being treated medically in view of advanced age and stability, mixed hyperlipidemia, stage III chronic kidney disease and solitary kidney.     Patient presented with anterolateral STEMI on 02/15/2022, cardiac catheterization revealing spontaneous sealing of the distal LAD dissection, 2 days later repeat cardiac catheterization also revealing patent LAD.  Admitted to the hospital on 12/23/2023 discharged on 12/29/2023 with acute encephalopathy and patient was made palliative care and hospice care and discharged to skilled nursing facility.  Discussed the use of AI scribe software for clinical note transcription with the patient, who gave verbal consent to proceed.  History of Present Illness            Labs   Lab Results  Component Value Date   CHOL 175 02/15/2022   HDL 53 02/15/2022   LDLCALC NOT CALCULATED 02/15/2022   TRIG 126 02/15/2022   CHOLHDL 3.3 02/15/2022   Lab Results  Component Value Date   NA 142 12/28/2023   K 3.9 12/28/2023   CO2 26 12/28/2023   GLUCOSE 107 (H) 12/28/2023   BUN 22 12/28/2023   CREATININE 1.51 (H) 12/28/2023   CALCIUM 9.3 12/28/2023   GFRNONAA 33 (L) 12/28/2023      Latest Ref Rng & Units 12/28/2023    2:49 AM 12/27/2023    7:49 AM 12/26/2023    4:33 AM  BMP  Glucose 70 - 99 mg/dL 409  811  914   BUN 8 - 23 mg/dL 22  24  30    Creatinine 0.44 - 1.00 mg/dL 7.82  9.56  2.13   Sodium 135 - 145 mmol/L 142  141  138   Potassium 3.5 - 5.1 mmol/L 3.9  4.5  3.3   Chloride 98 - 111 mmol/L 107  107  103   CO2 22 - 32 mmol/L 26  24  23     Calcium 8.9 - 10.3 mg/dL 9.3  9.3  9.5       Latest Ref Rng & Units 12/28/2023    2:49 AM 12/27/2023    7:49 AM 12/26/2023    4:33 AM  CBC  WBC 4.0 - 10.5 K/uL 5.5  5.2  6.2   Hemoglobin 12.0 - 15.0 g/dL 08.6  57.8  46.9   Hematocrit 36.0 - 46.0 % 40.6  42.6  43.7   Platelets 150 - 400 K/uL 196  205  199    External Labs:  ***  ***ROS  Physical Exam:   VS:  There were no vitals taken for this visit.   Wt Readings from Last 3 Encounters:  12/28/23 144 lb 13.5 oz (65.7 kg)  03/09/23 136 lb 12.4 oz (62 kg)  09/28/22 141 lb (64 kg)     ***Physical Exam  Studies Reviewed: Marland Kitchen    ECHOCARDIOGRAM COMPLETE 12/25/2023  1. Left ventricular ejection fraction, by estimation, is 75%. The left ventricle has hyperdynamic function. The left ventricle has no regional wall motion abnormalities. There is mild left ventricular hypertrophy. Left ventricular diastolic parameters are indeterminate. 2. Right ventricular systolic function is mildly reduced. The right ventricular  size is normal. Tricuspid regurgitation signal is inadequate for assessing PA pressure. 3. Right atrial size was mildly dilated. 4. The mitral valve is degenerative. Trivial mitral valve regurgitation. No evidence of mitral stenosis. Moderate mitral annular calcification. 5. The aortic valve is abnormal and not well visualized. There is moderate calcification of the aortic valve. Aortic valve regurgitation is not visualized. Mild-moderate aortic valve stenosis. Aortic valve area, by VTI measures 1.22 cm. Aortic valve mean gradient measures 11.4 mmHg. Aortic valve Vmax measures 2.31 m/s. DVI 0.48, SVI 20. Findings may represent paradoxical low flow low gradient AS, possibly moderate AS based on valve opening. Poor quality LVOT TVI makes assessment challenging. 6. The inferior vena cava is normal in size with greater than 50% respiratory variability, suggesting right atrial pressure of 3 mmHg.   EKG:         ***  Medications  and allergies    Allergies  Allergen Reactions   Benicar [Olmesartan] Shortness Of Breath   Corticosteroids Shortness Of Breath    Steroid inhalers   Other Shortness Of Breath, Rash and Other (See Comments)    Wool = Rashes Certain dyes; Fillers used by some manufacturers   Spironolactone Shortness Of Breath   Sulfa Antibiotics Hives   Tricor [Fenofibrate] Shortness Of Breath and Other (See Comments)    AMNEAL manufacturer only, causes shortness of breath otherwise.    Crestor [Rosuvastatin Calcium] Other (See Comments)    Myalgias   Klaron [Sulfacetamide] Hives   Lipitor [Atorvastatin] Other (See Comments)    Myalgias    Magnesium Other (See Comments)    Myalgias   Statins Other (See Comments)    Myalgias    Zofran [Ondansetron] Other (See Comments)    Disorientation    Bactrim [Sulfamethoxazole-Trimethoprim] Hives and Rash   Tobrex [Tobramycin] Rash and Other (See Comments)    Blurred vision     Current Outpatient Medications:    acetaminophen (TYLENOL) 500 MG tablet, Take 500-1,000 mg by mouth every 6 (six) hours as needed for moderate pain (pain score 4-6), fever or headache., Disp: , Rfl:    albuterol (VENTOLIN HFA) 108 (90 Base) MCG/ACT inhaler, Inhale 2 puffs into the lungs every 6 (six) hours as needed for wheezing or shortness of breath., Disp: , Rfl:    amLODipine (NORVASC) 5 MG tablet, Take 1 tablet (5 mg total) by mouth daily., Disp: , Rfl:    Cholecalciferol (VITAMIN D-3 PO), Take 1 capsule by mouth every evening., Disp: , Rfl:    fenofibrate micronized (LOFIBRA) 134 MG capsule, Take 134 mg by mouth daily after supper. Alembic, Disp: , Rfl:    isosorbide mononitrate (IMDUR) 30 MG 24 hr tablet, Take 1 tablet (30 mg total) by mouth daily., Disp: , Rfl:    Multiple Vitamins-Minerals (CENTRUM SILVER 50+WOMEN) TABS, Take 1 tablet by mouth daily with breakfast., Disp: , Rfl:    nitroGLYCERIN (NITROSTAT) 0.4 MG SL tablet, Place 1 tablet (0.4 mg total) under the tongue  every 5 (five) minutes as needed for chest pain., Disp: 30 tablet, Rfl: 0   pantoprazole (PROTONIX) 40 MG tablet, Take 1 tablet (40 mg total) by mouth daily., Disp: 90 tablet, Rfl: 3   pravastatin (PRAVACHOL) 80 MG tablet, TAKE 1 TABLET(80 MG) BY MOUTH DAILY AT 6 PM, Disp: 90 tablet, Rfl: 3   senna-docusate (SENOKOT-S) 8.6-50 MG tablet, Take 1 tablet by mouth at bedtime as needed for mild constipation., Disp: , Rfl:    ASSESSMENT AND PLAN: .  ICD-10-CM   1. Coronary artery disease involving native coronary artery of native heart without angina pectoris  I25.10     2. Cor pulmonale, chronic (HCC)  I27.81     3. Essential hypertension  I10     4. Pararenal abdominal aortic aneurysm (AAA) without rupture (HCC)  I71.41       1. Coronary artery disease involving native coronary artery of native heart without angina pectoris ***  2. Cor pulmonale, chronic (HCC) ***  3. Essential hypertension ***  4. Pararenal abdominal aortic aneurysm (AAA) without rupture [facility]) ***  Assessment and Plan                 Signed,  Yates Decamp, MD, [facility] 01/08/2024, 1:35 PM [facility] [address] [place], [place] [address] Phone: [phone]. Fax:  [phone]

## 2024-02-05 ENCOUNTER — Other Ambulatory Visit: Payer: Self-pay | Admitting: Cardiology

## 2024-02-05 DIAGNOSIS — I10 Essential (primary) hypertension: Secondary | ICD-10-CM

## 2024-02-06 MED FILL — Atropine Sulfate Soln Prefill Syr 1 MG/10ML (0.1 MG/ML): INTRAMUSCULAR | Qty: 10 | Status: AC

## 2024-02-12 ENCOUNTER — Ambulatory Visit: Payer: Medicare Other | Admitting: Neurology
